# Patient Record
Sex: Female | Born: 1980
Health system: Southern US, Community
[De-identification: ages and names within clinical notes are randomized; demographics above are authoritative.]

## PROBLEM LIST (undated history)

## (undated) ENCOUNTER — Emergency Department (HOSPITAL_COMMUNITY): Admission: EM | Discharge status: Against Medical Advice | Payer: Self-pay

## (undated) DIAGNOSIS — D649 Anemia, unspecified: Secondary | ICD-10-CM

## (undated) DIAGNOSIS — R51 Headache: Secondary | ICD-10-CM

## (undated) DIAGNOSIS — T4145XA Adverse effect of unspecified anesthetic, initial encounter: Secondary | ICD-10-CM

## (undated) DIAGNOSIS — Z8489 Family history of other specified conditions: Secondary | ICD-10-CM

## (undated) DIAGNOSIS — K630 Abscess of intestine: Secondary | ICD-10-CM

## (undated) DIAGNOSIS — K5090 Crohn's disease, unspecified, without complications: Secondary | ICD-10-CM

## (undated) DIAGNOSIS — T8859XA Other complications of anesthesia, initial encounter: Secondary | ICD-10-CM

## (undated) DIAGNOSIS — K631 Perforation of intestine (nontraumatic): Secondary | ICD-10-CM

## (undated) HISTORY — DX: Crohn's disease, unspecified, without complications: K50.90

## (undated) HISTORY — DX: Abscess of intestine: K63.0

## (undated) HISTORY — DX: Perforation of intestine (nontraumatic): K63.1

---
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
## 2011-11-13 DIAGNOSIS — D649 Anemia, unspecified: Secondary | ICD-10-CM

## 2011-11-13 HISTORY — DX: Anemia, unspecified: D64.9

## 2012-06-28 ENCOUNTER — Encounter (HOSPITAL_COMMUNITY): Payer: Self-pay | Admitting: *Deleted

## 2012-06-28 ENCOUNTER — Emergency Department (HOSPITAL_COMMUNITY)
Admission: EM | Admit: 2012-06-28 | Discharge: 2012-06-28 | Disposition: A | Discharge status: Home / Self Care | Payer: Self-pay | Attending: Emergency Medicine | Admitting: Emergency Medicine

## 2012-06-28 ENCOUNTER — Emergency Department (HOSPITAL_COMMUNITY): Payer: Self-pay

## 2012-06-28 DIAGNOSIS — R1013 Epigastric pain: Secondary | ICD-10-CM | POA: Insufficient documentation

## 2012-06-28 DIAGNOSIS — R112 Nausea with vomiting, unspecified: Secondary | ICD-10-CM | POA: Insufficient documentation

## 2012-06-28 DIAGNOSIS — Z79899 Other long term (current) drug therapy: Secondary | ICD-10-CM | POA: Insufficient documentation

## 2012-06-28 DIAGNOSIS — R109 Unspecified abdominal pain: Secondary | ICD-10-CM

## 2012-06-28 DIAGNOSIS — K5289 Other specified noninfective gastroenteritis and colitis: Secondary | ICD-10-CM | POA: Insufficient documentation

## 2012-06-28 DIAGNOSIS — K529 Noninfective gastroenteritis and colitis, unspecified: Secondary | ICD-10-CM

## 2012-06-28 DIAGNOSIS — R1031 Right lower quadrant pain: Secondary | ICD-10-CM | POA: Insufficient documentation

## 2012-06-28 LAB — URINALYSIS, ROUTINE W REFLEX MICROSCOPIC
Bilirubin Urine: NEGATIVE
Leukocytes, UA: NEGATIVE
Nitrite: NEGATIVE
Specific Gravity, Urine: 1.025 (ref 1.005–1.030)
Urobilinogen, UA: 0.2 mg/dL (ref 0.0–1.0)

## 2012-06-28 LAB — HEPATIC FUNCTION PANEL
AST: 16 U/L (ref 0–37)
Albumin: 3.2 g/dL — ABNORMAL LOW (ref 3.5–5.2)
Alkaline Phosphatase: 60 U/L (ref 39–117)
Total Bilirubin: 0.1 mg/dL — ABNORMAL LOW (ref 0.3–1.2)

## 2012-06-28 LAB — DIFFERENTIAL
Eosinophils Relative: 2 % (ref 0–5)
Lymphocytes Relative: 15 % (ref 12–46)
Lymphs Abs: 1.9 10*3/uL (ref 0.7–4.0)
Monocytes Absolute: 1 10*3/uL (ref 0.1–1.0)
Monocytes Relative: 8 % (ref 3–12)

## 2012-06-28 LAB — COMPREHENSIVE METABOLIC PANEL
ALT: 9 U/L (ref 0–35)
AST: 12 U/L (ref 0–37)
CO2: 27 mEq/L (ref 19–32)
Calcium: 9.2 mg/dL (ref 8.4–10.5)
Sodium: 137 mEq/L (ref 135–145)
Total Protein: 8 g/dL (ref 6.0–8.3)

## 2012-06-28 LAB — CBC
MCH: 24.2 pg — ABNORMAL LOW (ref 26.0–34.0)
Platelets: 509 10*3/uL — ABNORMAL HIGH (ref 150–400)
RBC: 3.92 MIL/uL (ref 3.87–5.11)

## 2012-06-28 MED ORDER — IOHEXOL 300 MG/ML  SOLN
20.0000 mL | INTRAMUSCULAR | Status: AC
  Administered 2012-06-28: 20 mL via ORAL

## 2012-06-28 MED ORDER — IOHEXOL 300 MG/ML  SOLN
100.0000 mL | Freq: Once | INTRAMUSCULAR | Status: AC | PRN
  Administered 2012-06-28: 100 mL via INTRAVENOUS

## 2012-06-28 MED ORDER — PANTOPRAZOLE SODIUM 40 MG IV SOLR
40.0000 mg | Freq: Once | INTRAVENOUS | Status: AC
  Administered 2012-06-28: 40 mg via INTRAVENOUS
  Filled 2012-06-28: qty 40

## 2012-06-28 MED ORDER — HYDROCODONE-ACETAMINOPHEN 5-500 MG PO TABS: 1.0000 | ORAL_TABLET | Freq: Four times a day (QID) | ORAL | Status: AC | PRN

## 2012-06-28 MED ORDER — SODIUM CHLORIDE 0.9 % IV BOLUS (SEPSIS)
1000.0000 mL | Freq: Once | INTRAVENOUS | Status: AC
  Administered 2012-06-28: 1000 mL via INTRAVENOUS

## 2012-06-28 MED ORDER — GI COCKTAIL ~~LOC~~
30.0000 mL | Freq: Once | ORAL | Status: AC
  Administered 2012-06-28: 30 mL via ORAL
  Filled 2012-06-28: qty 30

## 2012-06-28 MED ORDER — PREDNISONE 20 MG PO TABS: 20.0000 mg | ORAL_TABLET | Freq: Every day | ORAL | Status: AC

## 2012-06-28 NOTE — Consult Note (Signed)
Reason for Consult:  Abdominal pain, abnormal CT  Referring Physician: Nelva Nay, MD  Gina Gonzalez is an 31 y.o. female.  HPI:  Patient is a 31 year old female who presents approximately 6 weeks of abdominal pain. She describes the pain as starting in the epigastric region. It waxes and wanes in intensity. It has been as bad as to make her cry. Today it seemed a little bit worse and she couldn't take it anymore. She was doubled over in pain. She threw up 5 times because of the pain. She denies fevers and chills. She denies bloody bowel movements. She denies any family history of Crohn's disease or other inflammatory bowel disease. She generally never gets sick. She is in school. 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  She has not had any significant diarrhea. She denies constipation as well.  History reviewed. No pertinent past medical history.  PSH:  Tonsillectomy   History reviewed. No pertinent family history.  Social History:  Pt does not use tobacco products, nor has she.  She reports that she does not drink alcohol or use illicit drugs.  Allergies: No Known Allergies  Medications:  MedicationsLong-Term  Prescriptions Show Facility-Administered Medications    bismuth subsalicylate (PEPTO BISMOL) 262 MG/15ML suspension   ibuprofen (ADVIL,MOTRIN) 200 MG tablet   OMEPRAZOLE PO   Simethicone (ANTI GAS PO)     Results for orders placed during the hospital encounter of 06/28/12 (from the past 48 hour(s))  URINALYSIS, ROUTINE W REFLEX MICROSCOPIC     Status: Abnormal   Collection Time   06/28/12  1:58 PM      Component Value Range Comment   Color, Urine YELLOW  YELLOW    APPearance HAZY (*) CLEAR    Specific Gravity, Urine 1.025  1.005 - 1.030    pH 5.5  5.0 - 8.0    Glucose, UA NEGATIVE  NEGATIVE mg/dL    Hgb urine dipstick NEGATIVE  NEGATIVE    Bilirubin Urine NEGATIVE  NEGATIVE    Ketones, ur NEGATIVE  NEGATIVE mg/dL    Protein, ur NEGATIVE  NEGATIVE mg/dL    Urobilinogen, UA 0.2  0.0 - 1.0 mg/dL    Nitrite NEGATIVE  NEGATIVE    Leukocytes, UA NEGATIVE  NEGATIVE MICROSCOPIC NOT DONE ON URINES WITH NEGATIVE PROTEIN, BLOOD, LEUKOCYTES, NITRITE, OR GLUCOSE <1000 mg/dL.  CBC     Status: Abnormal   Collection Time   06/28/12  1:58 PM      Component Value Range Comment   WBC 12.5 (*) 4.0 - 10.5 K/uL    RBC 3.92  3.87 - 5.11 MIL/uL    Hemoglobin 9.5 (*) 12.0 - 15.0 g/dL    HCT 16.1 (*) 09.6 - 46.0 %    MCV 77.6 (*) 78.0 - 100.0 fL    MCH 24.2 (*) 26.0 - 34.0 pg    MCHC 31.3  30.0 - 36.0 g/dL    RDW 04.5  40.9 - 81.1 %    Platelets 509 (*) 150 - 400 K/uL   COMPREHENSIVE METABOLIC PANEL     Status: Abnormal   Collection Time   06/28/12  1:58 PM      Component Value Range Comment   Sodium 137  135 - 145 mEq/L    Potassium 3.4 (*) 3.5 - 5.1 mEq/L    Chloride 99  96 - 112 mEq/L    CO2 27  19 - 32 mEq/L    Glucose, Bld 90  70 - 99 mg/dL  BUN 6  6 - 23 mg/dL    Creatinine, Ser 1.61  0.50 - 1.10 mg/dL    Calcium 9.2  8.4 - 09.6 mg/dL    Total Protein 8.0  6.0 - 8.3 g/dL    Albumin 3.2 (*) 3.5 - 5.2 g/dL    AST 12  0 - 37 U/L    ALT 9  0 - 35 U/L    Alkaline Phosphatase 59  39 - 117 U/L    Total Bilirubin 0.2 (*) 0.3 - 1.2 mg/dL    GFR calc non Af Amer >90  >90 mL/min    GFR calc Af Amer >90  >90 mL/min   DIFFERENTIAL     Status: Abnormal   Collection Time   06/28/12  1:58 PM      Component Value Range Comment   Neutrophils Relative 74  43 - 77 %    Neutro Abs 9.3 (*) 1.7 - 7.7 K/uL    Lymphocytes Relative 15  12 - 46 %    Lymphs Abs 1.9  0.7 - 4.0 K/uL    Monocytes Relative 8  3 - 12 %    Monocytes Absolute 1.0  0.1 - 1.0 K/uL    Eosinophils Relative 2  0 - 5 %    Eosinophils Absolute 0.3  0.0 - 0.7 K/uL    Basophils Relative 0  0 - 1 %    Basophils Absolute 0.0  0.0 - 0.1 K/uL   POCT PREGNANCY, URINE     Status: Normal   Collection Time   06/28/12  2:02 PM      Component Value Range Comment   Preg Test, Ur NEGATIVE  NEGATIVE     HEPATIC FUNCTION PANEL     Status: Abnormal   Collection Time   06/28/12  3:38 PM      Component Value Range Comment   Total Protein 8.0  6.0 - 8.3 g/dL    Albumin 3.2 (*) 3.5 - 5.2 g/dL    AST 16  0 - 37 U/L    ALT 8  0 - 35 U/L    Alkaline Phosphatase 60  39 - 117 U/L    Total Bilirubin 0.1 (*) 0.3 - 1.2 mg/dL    Bilirubin, Direct <0.4  0.0 - 0.3 mg/dL    Indirect Bilirubin NOT CALCULATED  0.3 - 0.9 mg/dL   LIPASE, BLOOD     Status: Normal   Collection Time   06/28/12  3:38 PM      Component Value Range Comment   Lipase 26  11 - 59 U/L   OCCULT BLOOD, POC DEVICE     Status: Normal   Collection Time   06/28/12  3:48 PM      Component Value Range Comment   Fecal Occult Bld NEGATIVE       Ct Abdomen Pelvis W Contrast  06/28/2012  *RADIOLOGY REPORT*  Clinical Data: Right lower quadrant pain, tenderness.  Symptoms since July.  Vomiting.  CT ABDOMEN AND PELVIS WITH CONTRAST  Technique:  Multidetector CT imaging of the abdomen and pelvis was performed following the standard protocol during bolus administration of intravenous contrast.  Contrast: OMNIPAQUE IOHEXOL 300 MG/ML  SOLN  Comparison: None.  Findings: There is marked inflammation within the right lower quadrant.  This involves the region of the terminal ileum which shows marked thickening of bowel loops, ascending colon, and the associated mesentery.  The appendix is not well seen.  The inflammatory process measured together is  9.7 x 7.4 x 8.9 cm. There are multiple enlarged mesenteric lymph nodes within this region, measuring up to 1.6 cm.  Additionally, there is fat within the colonic wall, raising question of chronic inflammation.  Images of the lung bases are unremarkable.  No focal abnormality identified within the liver, spleen, pancreas, adrenal glands, or kidneys.  The gallbladder is present.  The stomach and proximal small bowel loops are normal in caliber and opacified with contrast.  Distal colonic loops are normal in  appearance.  The uterus is present.  There is a small amount of free pelvic fluid.  No adnexal mass.  There is bilateral symmetric SI joint sclerosis, raising the question of sacroiliac this.  IMPRESSION:  1.  The significant inflammatory process in the right lower quadrant.  The differential diagnosis includes Crohn disease as well as appendicitis.  Given the secondary findings of fat within the colonic wall and sacroiliitis, Crohn disease is favored. 2.  Regional lymph nodes may be reactive.  The findings were discussed with Dr. Radford Pax  on date at time.  Original Report Authenticated By: Patterson Hammersmith, M.D.    Review of Systems  Constitutional: Negative.   HENT: Negative.   Eyes: Negative.   Respiratory: Negative.   Cardiovascular: Negative.   Gastrointestinal: Positive for heartburn, nausea, vomiting and abdominal pain. Negative for diarrhea, constipation, blood in stool and melena.  Genitourinary: Negative.   Musculoskeletal: Negative.   Skin: Negative.   Neurological: Negative.   Endo/Heme/Allergies: Negative.   Psychiatric/Behavioral: Negative.    Blood pressure 101/58, pulse 92, temperature 99.3 F (37.4 C), temperature source Oral, resp. rate 18, last menstrual period 06/12/2012, SpO2 99.00%. Physical Exam  Constitutional: She is oriented to person, place, and time. She appears well-developed and well-nourished. No distress.  HENT:  Head: Normocephalic and atraumatic.  Right Ear: External ear normal.  Left Ear: External ear normal.  Eyes: Conjunctivae are normal. Pupils are equal, round, and reactive to light. No scleral icterus.  Neck: Normal range of motion. Neck supple. No JVD present. No tracheal deviation present. No thyromegaly present.  Cardiovascular: Normal rate, regular rhythm, normal heart sounds and intact distal pulses.  Exam reveals no gallop and no friction rub.   No murmur heard. Respiratory: Effort normal and breath sounds normal. No stridor. No respiratory  distress. She has no wheezes. She has no rales. She exhibits no tenderness.  GI: Soft. Bowel sounds are normal. She exhibits mass (firmness in RLQ in site of inflammatory mass). She exhibits no distension. There is tenderness (mild tenderness in RLQ). There is no rebound and no guarding.  Musculoskeletal: Normal range of motion. She exhibits no edema and no tenderness.  Lymphadenopathy:    She has no cervical adenopathy.  Neurological: She is alert and oriented to person, place, and time. Coordination normal.  Skin: Skin is warm and dry. No rash noted. She is not diaphoretic. No erythema. No pallor.  Psychiatric: She has a normal mood and affect. Her behavior is normal. Judgment and thought content normal.    Assessment/Plan: Right sided colitis and terminal ileitis with inflammatory mass. History, CT scan, and examination not consistent with appendicitis.  Recommend gastroenterology consult. Suspect Crohn's disease. No need for acute surgical intervention. Likely needs antibiotics followed by anti-inflammatory agents. Certainly, the patient may develop obstruction and or abscess and potentially need surgical intervention in the future. However at this moment there is no role for ileocecectomy.  Will leave disposition to medicine and/or gastroenterology.  Followup with surgery on  an as-needed basis. Gina Gonzalez 06/28/2012, 7:19 PM

## 2012-06-28 NOTE — ED Notes (Signed)
Paged IV team for IV start.  Waiting for IV team

## 2012-06-28 NOTE — ED Notes (Signed)
Reports having upper mid abd pain since July, had vomiting this am. Denies any vaginal or urinary symptoms. No acute distress noted at triage.

## 2012-06-28 NOTE — ED Provider Notes (Signed)
I saw and evaluated the patient, reviewed the resident's note and I agree with the findings and plan.   .Face to face Exam:  General:  Awake HEENT:  Atraumatic Resp:  Normal effort Abd:  Nondistended Neuro:No focal weakness Lymph: No adenopathy   Nelia Shi, MD 06/28/12 2009

## 2012-06-28 NOTE — ED Notes (Signed)
Attempted PIV x 3 w/o success d/t poor vein access

## 2012-06-28 NOTE — ED Provider Notes (Signed)
History     CSN: [id number]  Arrival date & time 06/28/12  1340   First MD Initiated Contact with Patient 06/28/12 1507      Chief Complaint  Patient presents with  . Abdominal Pain    (Consider location/radiation/quality/duration/timing/severity/associated sxs/prior treatment) Patient is a 31 y.o. female presenting with abdominal pain. The history is provided by the patient.  Abdominal Pain The primary symptoms of the illness include abdominal pain, nausea and vomiting. The primary symptoms of the illness do not include fever, shortness of breath, diarrhea, hematemesis, hematochezia, dysuria, vaginal discharge or vaginal bleeding. Episode onset: 6 weeks ago. The onset of the illness was sudden. Progression since onset: waxing and waning.  The abdominal pain is located in the epigastric region and periumbilical region. The abdominal pain does not radiate. Pain scale: currently mild. The abdominal pain is relieved by nothing. The abdominal pain is exacerbated by eating (caffeine).  Nausea began today.  The vomiting began today. Vomiting occurs 2 to 5 times per day. The emesis contains stomach contents.  The patient states that she believes she is currently not pregnant. Symptoms associated with the illness do not include chills, constipation, urgency, hematuria, frequency or back pain.    History reviewed. No pertinent past medical history.  History reviewed. No pertinent past surgical history.  History reviewed. No pertinent family history.  History  Substance Use Topics  . Smoking status: Not on file  . Smokeless tobacco: Not on file  . Alcohol Use: No    OB History    Grav Para Term Preterm Abortions TAB SAB Ect Mult Living                  Review of Systems  Constitutional: Negative for fever and chills.  HENT: Negative.   Eyes: Negative.   Respiratory: Negative for shortness of breath.   Cardiovascular: Negative for chest pain and leg swelling.  Gastrointestinal:  Positive for nausea, vomiting and abdominal pain. Negative for diarrhea, constipation, hematochezia and hematemesis.       Dark stool 3 weeks ago  Genitourinary: Negative for dysuria, urgency, frequency, hematuria, decreased urine volume, vaginal bleeding, vaginal discharge and difficulty urinating.  Musculoskeletal: Negative for back pain.  Skin: Negative for wound.  Neurological: Negative for light-headedness and headaches.  Psychiatric/Behavioral: Negative for confusion.  All other systems reviewed and are negative.    Allergies  Review of patient's allergies indicates no known allergies.  Home Medications   Current Outpatient Rx  Name Route Sig Dispense Refill  . BISMUTH SUBSALICYLATE 262 MG/15ML PO SUSP Oral Take 30 mLs by mouth every 6 (six) hours as needed. For upset stomach    . IBUPROFEN 200 MG PO TABS Oral Take 600 mg by mouth every 6 (six) hours as needed. For pain    . OMEPRAZOLE PO Oral Take 1 capsule by mouth daily.    . ANTI GAS PO Oral Take 1 capsule by mouth daily.      BP 142/82  Pulse 105  Temp 99.5 F (37.5 C) (Oral)  Resp 16  SpO2 98%  LMP 06/12/2012  Physical Exam  Nursing note and vitals reviewed. Constitutional: She is oriented to person, place, and time. She appears well-developed and well-nourished. No distress.  HENT:  Head: Normocephalic and atraumatic.  Right Ear: External ear normal.  Left Ear: External ear normal.  Nose: Nose normal.  Mouth/Throat: Oropharynx is clear and moist.  Eyes: Right eye exhibits no discharge. Left eye exhibits no discharge.  Neck: Neck  supple.  Cardiovascular: Normal rate, regular rhythm, normal heart sounds and intact distal pulses.   Pulmonary/Chest: Effort normal and breath sounds normal. No respiratory distress. She has no wheezes. She has no rales.  Abdominal: Soft. She exhibits no distension. There is tenderness in the right lower quadrant and epigastric area. There is no CVA tenderness.  Genitourinary:  Rectal exam shows external hemorrhoid (small, no evidence of thrombosis). Guaiac negative stool.       No stool on rectal exam  Musculoskeletal: She exhibits no edema.  Neurological: She is alert and oriented to person, place, and time.  Skin: Skin is warm and dry. She is not diaphoretic. No pallor.    ED Course  Procedures (including critical care time)  Labs Reviewed  URINALYSIS, ROUTINE W REFLEX MICROSCOPIC - Abnormal; Notable for the following:    APPearance HAZY (*)     All other components within normal limits  CBC - Abnormal; Notable for the following:    WBC 12.5 (*)     Hemoglobin 9.5 (*)     HCT 30.4 (*)     MCV 77.6 (*)     MCH 24.2 (*)     Platelets 509 (*)     All other components within normal limits  COMPREHENSIVE METABOLIC PANEL - Abnormal; Notable for the following:    Potassium 3.4 (*)     Albumin 3.2 (*)     Total Bilirubin 0.2 (*)     All other components within normal limits  HEPATIC FUNCTION PANEL - Abnormal; Notable for the following:    Albumin 3.2 (*)     Total Bilirubin 0.1 (*)     All other components within normal limits  DIFFERENTIAL - Abnormal; Notable for the following:    Neutro Abs 9.3 (*)     All other components within normal limits  POCT PREGNANCY, URINE  LIPASE, BLOOD  OCCULT BLOOD, POC DEVICE   Ct Abdomen Pelvis W Contrast  06/28/2012  *RADIOLOGY REPORT*  Clinical Data: Right lower quadrant pain, tenderness.  Symptoms since July.  Vomiting.  CT ABDOMEN AND PELVIS WITH CONTRAST  Technique:  Multidetector CT imaging of the abdomen and pelvis was performed following the standard protocol during bolus administration of intravenous contrast.  Contrast: OMNIPAQUE IOHEXOL 300 MG/ML  SOLN  Comparison: None.  Findings: There is marked inflammation within the right lower quadrant.  This involves the region of the terminal ileum which shows marked thickening of bowel loops, ascending colon, and the associated mesentery.  The appendix is not well  seen.  The inflammatory process measured together is 9.7 x 7.4 x 8.9 cm. There are multiple enlarged mesenteric lymph nodes within this region, measuring up to 1.6 cm.  Additionally, there is fat within the colonic wall, raising question of chronic inflammation.  Images of the lung bases are unremarkable.  No focal abnormality identified within the liver, spleen, pancreas, adrenal glands, or kidneys.  The gallbladder is present.  The stomach and proximal small bowel loops are normal in caliber and opacified with contrast.  Distal colonic loops are normal in appearance.  The uterus is present.  There is a small amount of free pelvic fluid.  No adnexal mass.  There is bilateral symmetric SI joint sclerosis, raising the question of sacroiliac this.  IMPRESSION:  1.  The significant inflammatory process in the right lower quadrant.  The differential diagnosis includes Crohn disease as well as appendicitis.  Given the secondary findings of fat within the colonic wall and  sacroiliitis, Crohn disease is favored. 2.  Regional lymph nodes may be reactive.  The findings were discussed with Dr. Radford Pax  on date at time.  Original Report Authenticated By: Patterson Hammersmith, M.D.     1. Inflammatory bowel diseases (IBD)       MDM  31 yo female with intermittent epigastric pain x 6 weeks, occasionally worse with food and caffeine. Also vague periumbilical pain and had worst episode of pain with vomiting this AM. Appears well, but most tender near McBurney's point. Symptoms most c/w gastritis/PUD, but with RLQ tenderness and vague periumbilical pain (along with white count) CT ordered to r/o appendicitis. No vaginal or urinary symptoms. CT shows large inflammatory changes, unable to see appendix. Surgery consulted, they feel this is all IBD, likely chron's. Patient's pain well controlled, is comfortable. Discussed over the phone with GI (Dr. Evette Cristal), who says despite no insurance will be able to follow up in clinic early  next week. Will put on prednisone per their suggestion (20 mg daily x 7 days) with vicodin for breakthrough pain. Otherwise no fevers and has mild anemia. Discussed importance of following up with GI, and concerning symptoms to look out for to return.        Pricilla Loveless, MD 06/28/12 2004

## 2012-07-29 ENCOUNTER — Encounter (HOSPITAL_COMMUNITY): Payer: Self-pay | Admitting: Emergency Medicine

## 2012-07-29 ENCOUNTER — Emergency Department (HOSPITAL_COMMUNITY): Payer: Self-pay

## 2012-07-29 ENCOUNTER — Inpatient Hospital Stay (HOSPITAL_COMMUNITY)
Admission: EM | Admit: 2012-07-29 | Discharge: 2012-08-05 | DRG: 394 | Disposition: A | Discharge status: Home / Self Care | Payer: MEDICAID | Attending: Family Medicine | Admitting: Family Medicine

## 2012-07-29 DIAGNOSIS — R1031 Right lower quadrant pain: Secondary | ICD-10-CM

## 2012-07-29 DIAGNOSIS — K5090 Crohn's disease, unspecified, without complications: Secondary | ICD-10-CM

## 2012-07-29 DIAGNOSIS — K631 Perforation of intestine (nontraumatic): Principal | ICD-10-CM

## 2012-07-29 DIAGNOSIS — R109 Unspecified abdominal pain: Secondary | ICD-10-CM

## 2012-07-29 DIAGNOSIS — R509 Fever, unspecified: Secondary | ICD-10-CM

## 2012-07-29 DIAGNOSIS — D509 Iron deficiency anemia, unspecified: Secondary | ICD-10-CM | POA: Diagnosis present

## 2012-07-29 DIAGNOSIS — E876 Hypokalemia: Secondary | ICD-10-CM | POA: Diagnosis present

## 2012-07-29 DIAGNOSIS — G479 Sleep disorder, unspecified: Secondary | ICD-10-CM

## 2012-07-29 DIAGNOSIS — K5000 Crohn's disease of small intestine without complications: Secondary | ICD-10-CM | POA: Diagnosis present

## 2012-07-29 DIAGNOSIS — K529 Noninfective gastroenteritis and colitis, unspecified: Secondary | ICD-10-CM

## 2012-07-29 DIAGNOSIS — Z79899 Other long term (current) drug therapy: Secondary | ICD-10-CM

## 2012-07-29 HISTORY — DX: Perforation of intestine (nontraumatic): K63.1

## 2012-07-29 LAB — URINALYSIS, ROUTINE W REFLEX MICROSCOPIC
Hgb urine dipstick: NEGATIVE
Ketones, ur: NEGATIVE mg/dL
Protein, ur: NEGATIVE mg/dL
Urobilinogen, UA: 0.2 mg/dL (ref 0.0–1.0)

## 2012-07-29 LAB — CBC WITH DIFFERENTIAL/PLATELET
Basophils Absolute: 0 10*3/uL (ref 0.0–0.1)
Basophils Relative: 0 % (ref 0–1)
Eosinophils Relative: 0 % (ref 0–5)
HCT: 31.2 % — ABNORMAL LOW (ref 36.0–46.0)
Lymphocytes Relative: 9 % — ABNORMAL LOW (ref 12–46)
MCHC: 30.4 g/dL (ref 30.0–36.0)
MCV: 76.1 fL — ABNORMAL LOW (ref 78.0–100.0)
Monocytes Absolute: 1.5 10*3/uL — ABNORMAL HIGH (ref 0.1–1.0)
RDW: 15.9 % — ABNORMAL HIGH (ref 11.5–15.5)

## 2012-07-29 LAB — BASIC METABOLIC PANEL
CO2: 26 mEq/L (ref 19–32)
Calcium: 9.3 mg/dL (ref 8.4–10.5)
Creatinine, Ser: 0.76 mg/dL (ref 0.50–1.10)

## 2012-07-29 MED ORDER — SODIUM CHLORIDE 0.9 % IV BOLUS (SEPSIS)
1000.0000 mL | Freq: Once | INTRAVENOUS | Status: AC
  Administered 2012-07-29: 1000 mL via INTRAVENOUS

## 2012-07-29 MED ORDER — METRONIDAZOLE IN NACL 5-0.79 MG/ML-% IV SOLN
500.0000 mg | Freq: Once | INTRAVENOUS | Status: AC
  Administered 2012-07-29: 500 mg via INTRAVENOUS
  Filled 2012-07-29: qty 100

## 2012-07-29 MED ORDER — IOHEXOL 300 MG/ML  SOLN
100.0000 mL | Freq: Once | INTRAMUSCULAR | Status: AC | PRN
  Administered 2012-07-29: 100 mL via INTRAVENOUS

## 2012-07-29 MED ORDER — ONDANSETRON HCL 4 MG/2ML IJ SOLN
4.0000 mg | Freq: Once | INTRAMUSCULAR | Status: DC
  Filled 2012-07-29 (×2): qty 2

## 2012-07-29 MED ORDER — HYDROMORPHONE HCL PF 1 MG/ML IJ SOLN
1.0000 mg | Freq: Once | INTRAMUSCULAR | Status: DC
  Filled 2012-07-29 (×2): qty 1

## 2012-07-29 MED ORDER — CIPROFLOXACIN IN D5W 400 MG/200ML IV SOLN
400.0000 mg | Freq: Once | INTRAVENOUS | Status: AC
  Administered 2012-07-29: 400 mg via INTRAVENOUS
  Filled 2012-07-29: qty 200

## 2012-07-29 MED ORDER — ACETAMINOPHEN 325 MG PO TABS
650.0000 mg | ORAL_TABLET | Freq: Once | ORAL | Status: AC
  Administered 2012-07-29: 650 mg via ORAL
  Filled 2012-07-29: qty 2

## 2012-07-29 MED ORDER — IOHEXOL 300 MG/ML  SOLN
20.0000 mL | INTRAMUSCULAR | Status: AC
  Administered 2012-07-29 (×2): 20 mL via ORAL

## 2012-07-29 NOTE — ED Provider Notes (Signed)
Spoke with surgery regarding patient and discussed CT findings.  She will see patient in consult, requests medicine to admit for bowel rest and IV antibiotics.  8:41 PM Spoke with family medicine to admit.  Will initiate cipro and flagyl.  Jimmye Norman, NP 07/29/12 [phone]

## 2012-07-29 NOTE — Consult Note (Signed)
Reason for Consult:  terminal ileitis with microperforation, probable crohn's dx Referring Physician: Susy Frizzle, MD  Gina Gonzalez is an 31 y.o. female.  HPI:  Pt is a 31 yo F who was seen last month in the ED for similar but less severe abdominal pain.  CT was concerning for crohn's disease.  She saw Dr. Madilyn Fireman, who placed her on steroids.  She states that overall she has felt much better until last night.  She developed some discomfort, followed by fever/ chills.  She did not really have anorexia.  She has still been taking the steroids.    History reviewed. Probable Crohns, no bx yet  .History reviewed. No pertinent past surgical history.  History reviewed. No pertinent family history.  Social History:  does not have a smoking history on file. She does not have any smokeless tobacco history on file. She reports that she does not drink alcohol or use illicit drugs.  Allergies: No Known Allergies  Medications: Prescriptions Show Facility-Administered Medications    ibuprofen (ADVIL,MOTRIN) 200 MG tablet   predniSONE (DELTASONE) 20 MG tablet   promethazine (PHENERGAN) 25 MG tablet   Simethicone (ANTI GAS PO)      Results for orders placed during the hospital encounter of 07/29/12 (from the past 48 hour(s))  CBC WITH DIFFERENTIAL     Status: Abnormal   Collection Time   07/29/12 11:46 AM      Component Value Range Comment   WBC 18.3 (*) 4.0 - 10.5 K/uL    RBC 4.10  3.87 - 5.11 MIL/uL    Hemoglobin 9.5 (*) 12.0 - 15.0 g/dL    HCT 47.8 (*) 29.5 - 46.0 %    MCV 76.1 (*) 78.0 - 100.0 fL    MCH 23.2 (*) 26.0 - 34.0 pg    MCHC 30.4  30.0 - 36.0 g/dL    RDW 62.1 (*) 30.8 - 15.5 %    Platelets 474 (*) 150 - 400 K/uL    Neutrophils Relative 82 (*) 43 - 77 %    Neutro Abs 15.0 (*) 1.7 - 7.7 K/uL    Lymphocytes Relative 9 (*) 12 - 46 %    Lymphs Abs 1.7  0.7 - 4.0 K/uL    Monocytes Relative 8  3 - 12 %    Monocytes Absolute 1.5 (*) 0.1 - 1.0 K/uL    Eosinophils Relative 0  0  - 5 %    Eosinophils Absolute 0.1  0.0 - 0.7 K/uL    Basophils Relative 0  0 - 1 %    Basophils Absolute 0.0  0.0 - 0.1 K/uL   BASIC METABOLIC PANEL     Status: Abnormal   Collection Time   07/29/12 11:46 AM      Component Value Range Comment   Sodium 132 (*) 135 - 145 mEq/L    Potassium 3.3 (*) 3.5 - 5.1 mEq/L    Chloride 95 (*) 96 - 112 mEq/L    CO2 26  19 - 32 mEq/L    Glucose, Bld 112 (*) 70 - 99 mg/dL    BUN 14  6 - 23 mg/dL    Creatinine, Ser 6.57  0.50 - 1.10 mg/dL    Calcium 9.3  8.4 - 84.6 mg/dL    GFR calc non Af Amer >90  >90 mL/min    GFR calc Af Amer >90  >90 mL/min   POCT PREGNANCY, URINE     Status: Normal   Collection Time   07/29/12 12:07  PM      Component Value Range Comment   Preg Test, Ur NEGATIVE  NEGATIVE   URINALYSIS, ROUTINE W REFLEX MICROSCOPIC     Status: Abnormal   Collection Time   07/29/12 12:13 PM      Component Value Range Comment   Color, Urine YELLOW  YELLOW    APPearance CLOUDY (*) CLEAR    Specific Gravity, Urine 1.029  1.005 - 1.030    pH 5.5  5.0 - 8.0    Glucose, UA NEGATIVE  NEGATIVE mg/dL    Hgb urine dipstick NEGATIVE  NEGATIVE    Bilirubin Urine SMALL (*) NEGATIVE    Ketones, ur NEGATIVE  NEGATIVE mg/dL    Protein, ur NEGATIVE  NEGATIVE mg/dL    Urobilinogen, UA 0.2  0.0 - 1.0 mg/dL    Nitrite NEGATIVE  NEGATIVE    Leukocytes, UA NEGATIVE  NEGATIVE MICROSCOPIC NOT DONE ON URINES WITH NEGATIVE PROTEIN, BLOOD, LEUKOCYTES, NITRITE, OR GLUCOSE <1000 mg/dL.    Ct Abdomen Pelvis W Contrast  07/29/2012  *RADIOLOGY REPORT*  Clinical Data: Abdominal pain.  Fever.  Leukocytosis.  Right lower quadrant inflammatory process.  CT ABDOMEN AND PELVIS WITH CONTRAST  Technique:  Multidetector CT imaging of the abdomen and pelvis was performed following the standard protocol during bolus administration of intravenous contrast.  Contrast: OMNIPAQUE IOHEXOL 300 MG/ML  SOLN  Comparison: 06/28/2012  Findings: Fluid density lesion along the anterior  splenic capsule measuring 1.7 cm on image 12 of series 2 appears stable. Otherwise, the liver, spleen, pancreas, and adrenal glands appear unremarkable.  The gallbladder and biliary system appear unremarkable.  The kidneys appear unremarkable, as do the proximal ureters.  Prominent infiltrative process in the right lower quadrant adjacent to the cecum is observed with prominent wall thickening of the distal ileum, extraluminal locules of gas in the inflammatory mesenteric process, and adjacent enlarged lymph nodes in the region. There is thickening of the inferior wall of the cecum. Orally measured contrast medium does appear to extend through the terminal ileum to the cecum, with a "string sign" in the terminal ileum and possible ulceration along the terminal ileum as well. The appendix is obscured completely.  Free pelvic fluid in the cul-de-sac is likely mildly complex. There is also free fluid adjacent to a 3.9 x 3.2 cm cystic lesion of the left ovary.  No other bowel lesions are observed.  There is sclerosis in the sacroiliac joints favoring sacroiliitis which appears bilaterally symmetric.  IMPRESSION:  1.  Prominent infiltrative process in the right lower quadrant is again noted, now with locules of extraluminal gas internally indicating microperforation.  Marked wall thickening of the terminal ileum noted.  The appendix is completely obscured.  Given the overall appearance, I suspect that this represents Crohn's disease with transmural terminal ileal inflammation, marked surrounding inflammatory findings, and localized microperforation. The appendix remains completely obscured, and accordingly a smoldering appendicitis is not readily excluded although the terminal ileum appears to be the epicenter of the process.  There is also a small amount of potentially complex free pelvic fluid in the cul-de-sac.  Orally administered contrast does extend through to the colon. 2.  3.9 cm left ovarian cyst. 3.  The  presence of bilateral symmetric sacroiliitis is commonly associated with inflammatory bowel disease/Crohn's disease. 4.  Stable likely incidental hypodense lesion in the anterior spleen.   Original Report Authenticated By: Dellia Cloud, M.D.     Review of Systems  Constitutional: Positive for fever and chills.  Negative for weight loss, malaise/fatigue and diaphoresis.  HENT: Negative.   Eyes: Negative.   Respiratory: Negative.   Cardiovascular: Negative.   Gastrointestinal: Positive for abdominal pain. Negative for heartburn, nausea, vomiting, diarrhea, constipation and blood in stool.  Genitourinary: Negative.   Musculoskeletal: Positive for back pain.  Skin: Negative.   Neurological: Negative.  Negative for weakness.  Endo/Heme/Allergies: Negative.   Psychiatric/Behavioral: Negative.    Blood pressure 118/72, pulse 76, temperature 97.9 F (36.6 C), temperature source Oral, resp. rate 16, last menstrual period 07/13/2012, SpO2 98.00%. Physical Exam  Constitutional: She is oriented to person, place, and time. She appears well-developed and well-nourished. No distress.  HENT:  Head: Normocephalic and atraumatic.  Mouth/Throat: Abnormal dentition.       Partial plate  Eyes: Conjunctivae normal are normal. Pupils are equal, round, and reactive to light. No scleral icterus.  Neck: Normal range of motion. Neck supple. No thyromegaly present.  Cardiovascular: Normal rate, regular rhythm, normal heart sounds and intact distal pulses.  Exam reveals no gallop and no friction rub.   No murmur heard. Respiratory: Breath sounds normal. No respiratory distress. She has no wheezes. She exhibits no tenderness.  GI: Soft. Bowel sounds are normal. She exhibits distension (mildly bloated) and mass (fullness in area of phlegmon). There is tenderness (RLQ).  Musculoskeletal: Normal range of motion.  Lymphadenopathy:    She has no cervical adenopathy.  Neurological: She is alert and oriented to  person, place, and time. Coordination normal.  Skin: Skin is warm and dry. No rash noted. She is not diaphoretic. No erythema. No pallor.  Psychiatric: She has a normal mood and affect. Her behavior is normal. Judgment and thought content normal.    Assessment/Plan: Crohn's terminal ileitis NPO IVF IV antibiotics.  Will likely need ileocectomy, will see what next 24-48 hours bring [facility] 07/29/2012, 10:58 PM

## 2012-07-29 NOTE — ED Provider Notes (Signed)
Medical screening examination/treatment/procedure(s) were conducted as a shared visit with non-physician practitioner(s) and myself.  I personally evaluated the patient during the encounter   Charles B. Bernette Mayers, MD 07/29/12 2317

## 2012-07-29 NOTE — ED Notes (Signed)
Pt sent here for eval from GI and they sts she has intermittent lower abd pain; pt denies pain at present; GI requests CT scan and blood work for Crohns work up

## 2012-07-29 NOTE — ED Notes (Signed)
Pt comes in reporting sent here for CT scan of abdomen. Pt possibly having Crohn's disease. Denying any n/v at this time. C/o RLQ pain, tender to palpation. No diarrhea. A x 4. Pt appearing pale.

## 2012-07-29 NOTE — ED Provider Notes (Signed)
History     CSN: [id number]  Arrival date & time 07/29/12  1123   First MD Initiated Contact with Patient 07/29/12 1515      Chief Complaint  Patient presents with  . Abdominal Pain    (Consider location/radiation/quality/duration/timing/severity/associated sxs/prior treatment) HPI Pt with intermittent abdominal pain for the last month or so. Seen in the ED for same 4 weeks ago and found to have inflammatory process in RLQ on CT, not felt to be appendicitis by Gen Surg. Discharged to GI followup, has seen Dr. Madilyn Fireman who states she has been reluctant to do much extensive outpatient evaluation due to lack of insurance. He gave her a course of steroids which has not helped much. Pt reports continued pain, nausea, no diarrhea. No blood in stool. She began running a fever last night, found to have temp 100.20F and HR 134 in Dr. Madilyn Fireman office today so he advised her to come to the ED for re-evaluation.   History reviewed. No pertinent past medical history.  History reviewed. No pertinent past surgical history.  History reviewed. No pertinent family history.  History  Substance Use Topics  . Smoking status: Not on file  . Smokeless tobacco: Not on file  . Alcohol Use: No    OB History    Grav Para Term Preterm Abortions TAB SAB Ect Mult Living                  Review of Systems All other systems reviewed and are negative except as noted in HPI.   Allergies  Review of patient's allergies indicates no known allergies.  Home Medications   Current Outpatient Rx  Name Route Sig Dispense Refill  . IBUPROFEN 200 MG PO TABS Oral Take 600 mg by mouth every 6 (six) hours as needed. For pain    . PREDNISONE 20 MG PO TABS Oral Take 20 mg by mouth daily.    Marland Kitchen PROMETHAZINE HCL 25 MG PO TABS Oral Take 25 mg by mouth every 6 (six) hours as needed. For nausea    . ANTI GAS PO Oral Take 1 capsule by mouth daily.      BP 125/57  Pulse 114  Temp 99.2 F (37.3 C) (Oral)  Resp 18  SpO2  98%  Physical Exam  Nursing note and vitals reviewed. Constitutional: She is oriented to person, place, and time. She appears well-developed and well-nourished.  HENT:  Head: Normocephalic and atraumatic.  Eyes: EOM are normal. Pupils are equal, round, and reactive to light.  Neck: Normal range of motion. Neck supple.  Cardiovascular: Normal rate, normal heart sounds and intact distal pulses.   Pulmonary/Chest: Effort normal and breath sounds normal.  Abdominal: Soft. She exhibits no distension and no mass. There is tenderness (RLQ tender). There is no rebound and no guarding.  Musculoskeletal: Normal range of motion. She exhibits no edema and no tenderness.  Neurological: She is alert and oriented to person, place, and time. She has normal strength. No cranial nerve deficit or sensory deficit.  Skin: Skin is warm and dry. No rash noted.  Psychiatric: She has a normal mood and affect.    ED Course  Procedures (including critical care time)  Labs Reviewed  CBC WITH DIFFERENTIAL - Abnormal; Notable for the following:    WBC 18.3 (*)     Hemoglobin 9.5 (*)     HCT 31.2 (*)     MCV 76.1 (*)     MCH 23.2 (*)     RDW  15.9 (*)     Platelets 474 (*)     Neutrophils Relative 82 (*)     Neutro Abs 15.0 (*)     Lymphocytes Relative 9 (*)     Monocytes Absolute 1.5 (*)     All other components within normal limits  BASIC METABOLIC PANEL - Abnormal; Notable for the following:    Sodium 132 (*)     Potassium 3.3 (*)     Chloride 95 (*)     Glucose, Bld 112 (*)     All other components within normal limits  URINALYSIS, ROUTINE W REFLEX MICROSCOPIC - Abnormal; Notable for the following:    APPearance CLOUDY (*)     Bilirubin Urine SMALL (*)     All other components within normal limits  POCT PREGNANCY, URINE  URINALYSIS, ROUTINE W REFLEX MICROSCOPIC   No results found.   No diagnosis found.    MDM  Increased leukocytosis, low grade fever and tachycardia. Discussed with Dr.  Madilyn Fireman as above. Will recheck CT abd/pel. Dispo per those results.    4:57 PM Pt moved to CDU pending CT scan. Discussed with Felicie Morn, PA. Medical screening examination/treatment/procedure(s) were conducted as a shared visit with non-physician practitioner(s) and myself.  I personally evaluated the patient during the encounter      Charles B. Bernette Mayers, MD 07/29/12 [phone]

## 2012-07-30 ENCOUNTER — Encounter (HOSPITAL_COMMUNITY): Payer: Self-pay | Admitting: *Deleted

## 2012-07-30 DIAGNOSIS — R509 Fever, unspecified: Secondary | ICD-10-CM

## 2012-07-30 DIAGNOSIS — R1032 Left lower quadrant pain: Secondary | ICD-10-CM

## 2012-07-30 LAB — CBC
MCH: 23.3 pg — ABNORMAL LOW (ref 26.0–34.0)
MCHC: 30.6 g/dL (ref 30.0–36.0)
MCV: 76.1 fL — ABNORMAL LOW (ref 78.0–100.0)
Platelets: 357 10*3/uL (ref 150–400)

## 2012-07-30 LAB — BASIC METABOLIC PANEL
BUN: 7 mg/dL (ref 6–23)
CO2: 30 mEq/L (ref 19–32)
Calcium: 9 mg/dL (ref 8.4–10.5)
GFR calc non Af Amer: >90 mL/min (ref >90)
Glucose, Bld: 82 mg/dL (ref 70–99)

## 2012-07-30 LAB — IRON AND TIBC
Iron: 14 ug/dL — ABNORMAL LOW (ref 42–135)
TIBC: 197 ug/dL — ABNORMAL LOW (ref 250–470)
UIBC: 183 ug/dL (ref 125–400)

## 2012-07-30 LAB — FERRITIN: Ferritin: 51 ng/mL (ref 10–291)

## 2012-07-30 LAB — FOLATE: Folate: 14.1 ng/mL

## 2012-07-30 MED ORDER — POTASSIUM CHLORIDE 10 MEQ/100ML IV SOLN
10.0000 meq | INTRAVENOUS | Status: AC
  Administered 2012-07-30 (×2): 10 meq via INTRAVENOUS
  Filled 2012-07-30 (×2): qty 100

## 2012-07-30 MED ORDER — METHYLPREDNISOLONE SODIUM SUCC 40 MG IJ SOLR
30.0000 mg | Freq: Two times a day (BID) | INTRAMUSCULAR | Status: DC
  Administered 2012-07-30 – 2012-07-31 (×3): 30 mg via INTRAVENOUS
  Filled 2012-07-30 (×5): qty 0.75

## 2012-07-30 MED ORDER — ACETAMINOPHEN 650 MG RE SUPP
650.0000 mg | Freq: Four times a day (QID) | RECTAL | Status: DC | PRN
  Administered 2012-07-30: 650 mg via RECTAL
  Filled 2012-07-30 (×2): qty 1

## 2012-07-30 MED ORDER — MORPHINE SULFATE 2 MG/ML IJ SOLN
1.0000 mg | INTRAMUSCULAR | Status: DC | PRN
  Administered 2012-08-04 (×2): 1 mg via INTRAVENOUS
  Filled 2012-07-30 (×2): qty 1

## 2012-07-30 MED ORDER — ACETAMINOPHEN 325 MG PO TABS: 650.0000 mg | ORAL_TABLET | Freq: Four times a day (QID) | ORAL | Status: DC | PRN

## 2012-07-30 MED ORDER — POTASSIUM CHLORIDE 10 MEQ/100ML IV SOLN
INTRAVENOUS | Status: AC
  Administered 2012-07-30: 10 meq
  Filled 2012-07-30: qty 100

## 2012-07-30 MED ORDER — PIPERACILLIN-TAZOBACTAM 3.375 G IVPB
3.3750 g | Freq: Three times a day (TID) | INTRAVENOUS | Status: DC
  Administered 2012-07-30 – 2012-08-05 (×19): 3.375 g via INTRAVENOUS
  Filled 2012-07-30 (×22): qty 50

## 2012-07-30 MED ORDER — METHYLPREDNISOLONE SODIUM SUCC 40 MG IJ SOLR: 20.0000 mg | Freq: Every day | INTRAMUSCULAR | Status: DC

## 2012-07-30 MED ORDER — CIPROFLOXACIN IN D5W 400 MG/200ML IV SOLN
400.0000 mg | Freq: Two times a day (BID) | INTRAVENOUS | Status: DC
  Filled 2012-07-30: qty 200

## 2012-07-30 MED ORDER — HEPARIN SODIUM (PORCINE) 5000 UNIT/ML IJ SOLN
5000.0000 [IU] | Freq: Three times a day (TID) | INTRAMUSCULAR | Status: DC
  Filled 2012-07-30 (×23): qty 1

## 2012-07-30 MED ORDER — PANTOPRAZOLE SODIUM 40 MG IV SOLR
40.0000 mg | INTRAVENOUS | Status: DC
  Administered 2012-07-30 – 2012-08-04 (×7): 40 mg via INTRAVENOUS
  Filled 2012-07-30 (×8): qty 40

## 2012-07-30 MED ORDER — ONDANSETRON HCL 4 MG/2ML IJ SOLN
4.0000 mg | Freq: Four times a day (QID) | INTRAMUSCULAR | Status: DC | PRN
  Administered 2012-08-03 – 2012-08-04 (×2): 4 mg via INTRAVENOUS
  Filled 2012-07-30 (×2): qty 2

## 2012-07-30 MED ORDER — METRONIDAZOLE IN NACL 5-0.79 MG/ML-% IV SOLN
500.0000 mg | Freq: Three times a day (TID) | INTRAVENOUS | Status: DC
  Administered 2012-07-30: 500 mg via INTRAVENOUS
  Filled 2012-07-30 (×2): qty 100

## 2012-07-30 MED ORDER — DEXTROSE-NACL 5-0.45 % IV SOLN
INTRAVENOUS | Status: DC
  Administered 2012-07-30 (×2): via INTRAVENOUS
  Administered 2012-07-31 (×2): 1000 mL via INTRAVENOUS
  Administered 2012-07-31 – 2012-08-04 (×7): via INTRAVENOUS
  Administered 2012-08-04: 75 mL/h via INTRAVENOUS

## 2012-07-30 MED ORDER — SODIUM CHLORIDE 0.9 % IV SOLN
INTRAVENOUS | Status: DC
  Administered 2012-07-30: 08:00:00 via INTRAVENOUS
  Administered 2012-07-30: 1000 mL via INTRAVENOUS

## 2012-07-30 MED ORDER — ONDANSETRON HCL 8 MG PO TABS: 4.0000 mg | ORAL_TABLET | Freq: Four times a day (QID) | ORAL | Status: DC | PRN

## 2012-07-30 NOTE — Progress Notes (Signed)
Subjective: Resting quietly in bed, states that her pain is well controlled, denies any N/V. Requests diet education.  Objective: Vital signs in last 24 hours: Temp:  [97.9 F (36.6 C)-99.2 F (37.3 C)] 98 F (36.7 C) (09/18 0536) Pulse Rate:  [76-114] 86  (09/18 0536) Resp:  [16-20] 20  (09/18 0536) BP: (106-139)/(57-79) 139/79 mmHg (09/18 0536) SpO2:  [95 %-100 %] 95 % (09/18 0536) Weight:  [254 lb (115.214 kg)] 254 lb (115.214 kg) (09/18 0050) Last BM Date: 07/28/12  Intake/Output from previous day: 09/17 0701 - 09/18 0700 In: 967.5 [I.V.:857.5; IV Piggyback:110] Out: 1 [Urine:1] Intake/Output this shift:    General appearance: alert, cooperative, appears stated age, no distress and moderately obese Chest: CTA bilaterally Cardiac: RRR, No M/R/G Abdomen: obese, soft, diffusely tender more in rlq, minimal BS, +BM "watery" this am x 1.  VSS, afebrile, WBC wnl, H&H down slightly (probably secondary to IVF)  Lab Results:   Basename 07/30/12 0520 07/29/12 1146  WBC 10.1 18.3*  HGB 8.3* 9.5*  HCT 27.1* 31.2*  PLT 357 474*   BMET  Basename 07/30/12 0520 07/29/12 1146  NA 139 132*  K 3.3* 3.3*  CL 103 95*  CO2 30 26  GLUCOSE 82 112*  BUN 7 14  CREATININE 0.65 0.76  CALCIUM 9.0 9.3   PT/INR No results found for this basename: LABPROT:2,INR:2 in the last 72 hours ABG No results found for this basename: PHART:2,PCO2:2,PO2:2,HCO3:2 in the last 72 hours  Studies/Results: Ct Abdomen Pelvis W Contrast  07/29/2012  *RADIOLOGY REPORT*  Clinical Data: Abdominal pain.  Fever.  Leukocytosis.  Right lower quadrant inflammatory process.  CT ABDOMEN AND PELVIS WITH CONTRAST  Technique:  Multidetector CT imaging of the abdomen and pelvis was performed following the standard protocol during bolus administration of intravenous contrast.  Contrast: OMNIPAQUE IOHEXOL 300 MG/ML  SOLN  Comparison: 06/28/2012  Findings: Fluid density lesion along the anterior splenic capsule  measuring 1.7 cm on image 12 of series 2 appears stable. Otherwise, the liver, spleen, pancreas, and adrenal glands appear unremarkable.  The gallbladder and biliary system appear unremarkable.  The kidneys appear unremarkable, as do the proximal ureters.  Prominent infiltrative process in the right lower quadrant adjacent to the cecum is observed with prominent wall thickening of the distal ileum, extraluminal locules of gas in the inflammatory mesenteric process, and adjacent enlarged lymph nodes in the region. There is thickening of the inferior wall of the cecum. Orally measured contrast medium does appear to extend through the terminal ileum to the cecum, with a "string sign" in the terminal ileum and possible ulceration along the terminal ileum as well. The appendix is obscured completely.  Free pelvic fluid in the cul-de-sac is likely mildly complex. There is also free fluid adjacent to a 3.9 x 3.2 cm cystic lesion of the left ovary.  No other bowel lesions are observed.  There is sclerosis in the sacroiliac joints favoring sacroiliitis which appears bilaterally symmetric.  IMPRESSION:  1.  Prominent infiltrative process in the right lower quadrant is again noted, now with locules of extraluminal gas internally indicating microperforation.  Marked wall thickening of the terminal ileum noted.  The appendix is completely obscured.  Given the overall appearance, I suspect that this represents Crohn's disease with transmural terminal ileal inflammation, marked surrounding inflammatory findings, and localized microperforation. The appendix remains completely obscured, and accordingly a smoldering appendicitis is not readily excluded although the terminal ileum appears to be the epicenter of the process.  There is also a small amount of potentially complex free pelvic fluid in the cul-de-sac.  Orally administered contrast does extend through to the colon. 2.  3.9 cm left ovarian cyst. 3.  The presence of bilateral  symmetric sacroiliitis is commonly associated with inflammatory bowel disease/Crohn's disease. 4.  Stable likely incidental hypodense lesion in the anterior spleen.   Original Report Authenticated By: Dellia Cloud, M.D.     Anti-infectives: Anti-infectives     Start     Dose/Rate Route Frequency Ordered Stop   07/30/12 0900   ciprofloxacin (CIPRO) IVPB 400 mg  Status:  Discontinued        400 mg 200 mL/hr over 60 Minutes Intravenous Every 12 hours 07/30/12 0011 07/30/12 0726   07/30/12 0730  piperacillin-tazobactam (ZOSYN) IVPB 3.375 g       3.375 g 12.5 mL/hr over 240 Minutes Intravenous 3 times per day 07/30/12 0726     07/30/12 0600   metroNIDAZOLE (FLAGYL) IVPB 500 mg  Status:  Discontinued        500 mg 100 mL/hr over 60 Minutes Intravenous Every 8 hours 07/30/12 0011 07/30/12 0726   07/29/12 2045   ciprofloxacin (CIPRO) IVPB 400 mg        400 mg 200 mL/hr over 60 Minutes Intravenous  Once 07/29/12 2042 07/29/12 2219   07/29/12 2045   metroNIDAZOLE (FLAGYL) IVPB 500 mg        500 mg 100 mL/hr over 60 Minutes Intravenous  Once 07/29/12 2042 07/29/12 2222          Assessment/Plan:  Patient Active Problem List  Diagnosis  . Perforated small intestine  . Abdominal pain, acute, bilateral lower quadrant  . Fever  Crohn's disease per CT  s/p * No surgery found *  1. DC Cipro/Flagyl  Begin Zosyn 2. Continue with conservative mangement; NPO, IVF 3. Dietary consult for diet education 4. Probable CT scan on Friday for recheck 5. Follow labs 6. Check stool for hemocult    LOS: 1 day    Jacqualynn Parco 07/30/2012

## 2012-07-30 NOTE — Clinical Social Work Psychosocial (Addendum)
    Clinical Social Work Department BRIEF PSYCHOSOCIAL ASSESSMENT 07/30/2012  Patient:  Gina Gonzalez,Gina Gonzalez     Account Number:  [id number]     Admit date:  07/29/2012  Clinical Social Worker:  Lourdes Sledge  Date/Time:  07/30/2012 02:18 PM  Referred by:  Physician  Date Referred:  07/30/2012 Referred for  Other - See comment   Other Referral:   Pt presents with questions regarding Medicaid   Interview type:  Patient Other interview type:    PSYCHOSOCIAL DATA Living Status:  FAMILY Admitted from facility:   Level of care:   Primary support name:  Gina Gonzalez [phone] Primary support relationship to patient:  PARENT Degree of support available:   Pt reports having a support system and living with her parent.    CURRENT CONCERNS Current Concerns  Other - See comment   Other Concerns:   Pt presents with questions regarding Medicaid    SOCIAL WORK ASSESSMENT / PLAN CSW received a referral to assist pt with the Medicaid process.    CSW contacted the financial counselor and was informed that pt has been contacted and informed that she currently does not qualify for disability or Medicaid. CSW informed pt that if she is accepted into the [facility] she can apply for an orange card which would be used for her visits with her PCP. Pt was very receptive and appreciative of resources. Pt also asked if she could qualify for foodstamps. CSW informed pt that pt would need to go to DSS in [facility] to apply for food stamps. CSW informed pt that she would need to provide required documentation to show proof of income. Pt stated she would do so and had no further questions.    CSW signing off.   Assessment/plan status:  No Further Intervention Required Other assessment/ plan:   Information/referral to community resources:   CSW contacted the Artist as pt is uninsured with a hx of GI problems. The financial counselor has been in contact with pt and  informed her that she could qualify for financial assistance to assist with her medical bill. CSW also informed pt that she could potentially qualify for the orange card if she is accepted into [facility] CSW also provided pt with information regarding where to go and how to apply for food stamps. CSW will inquire whether [facility] will accept pt in the [facility] clinic.    PATIENT'S/FAMILY'S RESPONSE TO PLAN OF CARE: Pt alert and oriented and pleasant to speak to. Pt appreciative of resources and information given. Pt agrees to follow up with DSS to apply for food stamps. Pt had no additional questions for CSW.    CSW signing off.

## 2012-07-30 NOTE — H&P (Signed)
[facility] Attending Admission Note: Gina Don MD Personal pager:  [phone] [facility] Service Pager:  [phone]  I  have seen and examined this patient, reviewed their chart. I have discussed this patient with the resident. I agree with the resident's findings, assessment and care plan.  Briefly, 31 yo Female with suspected Crohn's disease with 1 day history of malaise, fevers and chills, and RLQ abdominal pain admitted for intestinal microperforation found by repeat CT scan.  About 1 month prior, patient having increasing abdominal pain, presented to ED and had CT scan which pointed toward's Crohn disease.  Set up with Dr. Madilyn Fireman GI, started on Prednisone, which she has been on for about 1 month.  Fevers, malaise, abdominal pain started yesterday and she presented to [facility] office for previously scheduled appt.  Sent to ED where CT scan showed microperforation.  PE: Gen:  Alert, cooperative patient who appears stated age in no acute distress.  Vital signs reviewed. Cardiac:  Regular rate and rhythm without murmur auscultated.  Good S1/S2. Pulm:  Clear to auscultation bilaterally with good air movement.  No wheezes or rales noted.   Abdomen:  Soft/obese.  Minimal tenderness RLQ.  Good bowel sounds.  No guarding or rebound.    A/P: 1.  Microperforation:  Likely secondary to Crohn's disease.  Surgery has already seen patient.  Plan for bowel rest, IV antibiotics, and close follow-up.  Decision for surgery to be made after 24 - 48 hours of bowel rest.  She has only been on steroids for about 3 weeks total (she went 1 week without taking any Prednisone out of past 4 weeks), and therefore much less likely stress dose steroids will be needed.  Follow hemodynamic status and increase solumedrol if needed. 2.  Leukocytosis:  Marked improvement with IV fluids and antibiotics.   3.  Microcytic anemia:  Trend Hgb.  9.5 baseline for past month, decreased with IV fluids.   4.  Hypokalemia:  Persistently low K+ since 1 month  prior.  Will need replacement and follow.    Tobey Grim, MD

## 2012-07-30 NOTE — H&P (Signed)
Family Medicine Teaching [facility] Admission History and Physical Service Pager: [phone]  Patient name: Gina Gonzalez Medical record number: [id number] Date of birth: [birth year] Age: 31 y.o. Gender: female  Primary Care Provider: Pcp Not In System  Chief Complaint: Abdominal pain, fever  History of Present Illness: Gina Gonzalez is a 31 y.o. year old female with a 1 month history of abdominal pain presented to the ED for abdominal pain and fever, after her outpatient visit from Dr. Madilyn Fireman (GI) office. She experienced increase pain this morning compared to prior pain. She was seen in the office, where she was noted to be febrile, and sent for CT of the abdomen. CT of abdomen resulted with microperforation of the small bowel, probable Chron's Disease Diagnosis. She has been managed with 20 mg prednisone over the past month, and reports on the medication the pain had improved until recently. She denies vomit, fatigue, diarrhea or blood per rectum. She reports chills and being nauseated frequently and unable to eat her normal diet, she has started to implement more of a bland diet to relieve her symptoms. She last ate at 9:30 this morning.  Patient Active Problem List  Diagnosis  . Perforated small intestine  . Abdominal pain, acute, bilateral lower quadrant  . Fever   Past Medical History: History reviewed. No pertinent past medical history. Past Surgical History: Past Surgical History  Procedure Date  . Tonsillectomy    Social History: History  Substance Use Topics  . Smoking status: Never Smoker   . Smokeless tobacco: Not on file  . Alcohol Use: No   For any additional social history documentation, please refer to relevant sections of EMR.  Family History: Family History  Problem Relation Age of Onset  . Thyroid disease Mother   . Cancer Maternal Grandmother    Allergies: No Known Allergies No current facility-administered medications on file prior to encounter.    Current Outpatient Prescriptions on File Prior to Encounter  Medication Sig Dispense Refill  . ibuprofen (ADVIL,MOTRIN) 200 MG tablet Take 600 mg by mouth every 6 (six) hours as needed. For pain      . promethazine (PHENERGAN) 25 MG tablet Take 25 mg by mouth every 6 (six) hours as needed. For nausea      . Simethicone (ANTI GAS PO) Take 1 capsule by mouth daily.       Review Of Systems: Per HPI  Otherwise 12 point review of systems was performed and was unremarkable.  Physical Exam: BP 118/72  Pulse 76  Temp 97.9 F (36.6 C) (Oral)  Resp 16  SpO2 98%  LMP 07/13/2012 Exam: General: Alert. Oriented. NAD. Worried about school/work absence for admission to hospital.  HEENT: [place]. AT. EENT: WNL.  Cardiovascular: RRR. S1S2. No murmur. Respiratory: CTAB. No wheezing, rhonchi or rales. Abdomen: Soft. ND. Mildly TTP lower quadrants. No HSM or masses. BS+ Extremities: ROM WNL. +2/4 pulses bialteral UE/LE. Well perfused.  Skin: Warm and dry. Neuro: Grossly intact. No focal deficits.  Labs and Imaging:  07/29/12 CTIMPRESSION:  1. Prominent infiltrative process in the right lower quadrant is  again noted, now with locules of extraluminal gas internally  indicating microperforation. Marked wall thickening of the  terminal ileum noted. The appendix is completely obscured. Given  the overall appearance, I suspect that this represents Crohn's  disease with transmural terminal ileal inflammation, marked  surrounding inflammatory findings, and localized microperforation.  The appendix remains completely obscured, and accordingly a  smoldering appendicitis is not readily excluded although the  terminal ileum appears to be the epicenter of the process. There  is also a small amount of potentially complex free pelvic fluid in  the cul-de-sac. Orally administered contrast does extend through  to the colon.  2. 3.9 cm left ovarian cyst.  3. The presence of bilateral symmetric sacroiliitis is  commonly  associated with inflammatory bowel disease/Crohn's disease.  4. Stable likely incidental hypodense lesion in the anterior  spleen.   CBC BMET   Lab 07/29/12 1146  WBC 18.3*  HGB 9.5*  HCT 31.2*  PLT 474*    Lab 07/29/12 1146  NA 132*  K 3.3*  CL 95*  CO2 26  BUN 14  CREATININE 0.76  GLUCOSE 112*  CALCIUM 9.3     Assessment and Plan: Gina Gonzalez is a 31 y.o. year old with a 1 month history of abdominal pain presented to the ED for abdominal pain and fever, after her outpatient visit from Dr. Madilyn Fireman (GI) office.  She was seen in the office, where she was noted to be febrile with abdominal pain. She was sent for CT of the abdomen. CT of abdomen resulted with microperforation of the small bowel, probable Chron's Disease Diagnosis vs. Ulcerative colitis vs smoldering appendix. 1. Abdominal Pain: - CT: IMPRESSION:  Microperforation of small bowel. Marked wall thickening of the terminal ileum noted; suspected Crohn's disease with transmural terminal ileal inflammation, marked surrounding inflammatory findings, and localized microperforation.  - Rectal exam in the A.M - methylprednisolone 20 mg  IV daily starting tomorrow - Pain: Morphine IV - ABX: Ciprofloxacin IV and Flagyl IV - Zofran IV for nausea - Preg test: Negative - Labs:CBC, BMP, retic, CRP,  UA pending - Surgical consult  2. Anemia: - Folate, B12, iron, TIBC, CBC pending - Monitor hemoglobin closely  3. FEN/GI:  - NPO - IVF bolus x1, then 157mL/hr NS  - Social work and case management consulted Full code PPX: Lovenox, protonix IV Dispo: Pending 48 hours IV antibiotics, afebrile and surgical and GI consultation. Felix Pacini, DO 07/30/2012, 12:21 AM  PGY-2 Addendum  HPI: Briefly, this is a 31 year old woman with likely Crohn's disease presenting to the ED from GI clinic with one day history of low grade fever and tachycardia.  In the ED, she was found to have an elevated white count and  microperforation of the bowel on CT.  She denies any active abdominal pain, diarrhea, nausea or vomiting.  Last emesis 4 days ago.  Did take 1 vicodin this morning.  Started on prednisone 20mg  daily with improvement in symptoms.  No history or diarrhea, blood in stool, joint pain, eye pain, skin findings.    See excellent intern note for complete history.  Physical Exam BP 118/72  Pulse 76  Temp 97.9 F (36.6 C) (Oral)  Resp 16  SpO2 98%  LMP 07/13/2012 Gen: alert, cooperative, NAD HEENT: AT/[place], sclera white, MMM CV: RRR, no murmurs Pulm: CTAB, no wheezes or rales Abd: +BS, soft, ND, mildly tender in RLQ, no peritoneal signs Ext: no edema, 2+ DP pulses Neuro: alert, oriented, grossly normal cranial nerves and motor strength  Assessment and Plan 31 year old woman with likely new diagnosis of Crohn's Disease presenting with microperforation of ileum.  # Microperforation: Likely secondary to Crohn's disease.  Has already been seen by surgery in the ED who recommended conservative management.  Elevated white count, but no evidence for abscess on CT scan.  Otherwise asymptomatic.  - Bowel rest - IVFs for hydration -  IV cipro and flagyl - Switch prednisone to solumedrol given NPO - Will check CRP in the morning with BMP and CBC - Appreciate surgery's recommendations  # Anemia: Stable at 9.5.  Microcytic.  FOBT was negative in August. - Will check iron studies - Consider repeat FOBT  # Social: Patient without insurance. - Will consult social work and case management about orange card vs Medicaid  FEN/GI: NPO; NS at 150cc/hr PPx: SQ heparin, protonix Dispo: admit to floor  BOOTH, Vincie Linn 07/30/2012, 1:08 AM

## 2012-07-30 NOTE — Progress Notes (Signed)
Patient seen and examined.  She looks better than the CT. Would continue bowel rest and IV abxs.  Repeat CT 4-5 days from the previous one.  If she fails medical management would require ileocecectomy with possible ileostomy.

## 2012-07-30 NOTE — Progress Notes (Signed)
I discussed with Dr Sonnenberg.  I agree with their plans documented in their progress note for today.  

## 2012-07-30 NOTE — Progress Notes (Signed)
Nutrition Education Note  RD consulted for nutrition education regarding a Crohn's appropriate diet.   RD provided "Nutrition Therapy for Crohn's Disease" to pt and family.  Pt states she has done a lot of research online prior to admission.  Discussed goals of nutrition-related therapy and ways to achieve.  Expect good compliance.  Body mass index is 39.78 kg/(m^2). Pt meets criteria for obesity based on current BMI.  Current diet order is NPO. Labs and medications reviewed. No further nutrition interventions warranted at this time. RD contact information provided. If additional nutrition issues arise, please re-consult RD.  Loyce Dys, MS RD LDN Clinical Inpatient Dietitian Pager: [phone] Weekend/After hours pager: [phone]

## 2012-07-30 NOTE — Progress Notes (Signed)
Patient ID: Gina Gonzalez, female   DOB: 02/27/1981, 31 y.o.   MRN: [id number] Family Medicine Teaching Service Daily Progress Note Service Page: [phone]  Patient Assessment: 31 yo female with probable crohns disease found to have microperforations on CT abdomen admitted for bowel rest and antibiotic therapy  Subjective: Patient states doing well this morning. Only complaint is that she is hungry.  Only pain occurs when she presses on her belly and then it is 1/10.  Denies fevers and chills.  States had watery diarrhea this morning.  Objective: Temp:  [97.9 F (36.6 C)-99.2 F (37.3 C)] 98 F (36.7 C) (09/18 0536) Pulse Rate:  [76-114] 86  (09/18 0536) Resp:  [16-20] 20  (09/18 0536) BP: (106-139)/(57-79) 139/79 mmHg (09/18 0536) SpO2:  [95 %-100 %] 95 % (09/18 0536) Weight:  [254 lb (115.214 kg)] 254 lb (115.214 kg) (09/18 0050) Exam: General: NAD, resting comfortably in bed Cardiovascular: rrr, no murmurs, rubs, or gallops Respiratory: CTAB, no wheezes or crackles Abdomen: soft, minimal tenderness in RLQ and suprapubic area, ND, +BS, no masses Extremities: no edema  I have reviewed the patient's medications, labs, imaging, and diagnostic testing.  Notable results are summarized below.  CBC BMET   Lab 07/30/12 0520 07/29/12 1146  WBC 10.1 18.3*  HGB 8.3* 9.5*  HCT 27.1* 31.2*  PLT 357 474*    Lab 07/30/12 0520 07/29/12 1146  NA 139 132*  K 3.3* 3.3*  CL 103 95*  CO2 30 26  BUN 7 14  CREATININE 0.65 0.76  GLUCOSE 82 112*  CALCIUM 9.0 9.3     Results for orders placed during the hospital encounter of 07/29/12 (from the past 24 hour(s))  CBC WITH DIFFERENTIAL     Status: Abnormal   Collection Time   07/29/12 11:46 AM      Component Value Range   WBC 18.3 (*) 4.0 - 10.5 K/uL   RBC 4.10  3.87 - 5.11 MIL/uL   Hemoglobin 9.5 (*) 12.0 - 15.0 g/dL   HCT 14.7 (*) 82.9 - 56.2 %   MCV 76.1 (*) 78.0 - 100.0 fL   MCH 23.2 (*) 26.0 - 34.0 pg   MCHC 30.4  30.0 - 36.0 g/dL   RDW 13.0 (*) 86.5 - 78.4 %   Platelets 474 (*) 150 - 400 K/uL   Neutrophils Relative 82 (*) 43 - 77 %   Neutro Abs 15.0 (*) 1.7 - 7.7 K/uL   Lymphocytes Relative 9 (*) 12 - 46 %   Lymphs Abs 1.7  0.7 - 4.0 K/uL   Monocytes Relative 8  3 - 12 %   Monocytes Absolute 1.5 (*) 0.1 - 1.0 K/uL   Eosinophils Relative 0  0 - 5 %   Eosinophils Absolute 0.1  0.0 - 0.7 K/uL   Basophils Relative 0  0 - 1 %   Basophils Absolute 0.0  0.0 - 0.1 K/uL  BASIC METABOLIC PANEL     Status: Abnormal   Collection Time   07/29/12 11:46 AM      Component Value Range   Sodium 132 (*) 135 - 145 mEq/L   Potassium 3.3 (*) 3.5 - 5.1 mEq/L   Chloride 95 (*) 96 - 112 mEq/L   CO2 26  19 - 32 mEq/L   Glucose, Bld 112 (*) 70 - 99 mg/dL   BUN 14  6 - 23 mg/dL   Creatinine, Ser 6.96  0.50 - 1.10 mg/dL   Calcium 9.3  8.4 - 29.5 mg/dL  GFR calc non Af Amer >90  >90 mL/min   GFR calc Af Amer >90  >90 mL/min  POCT PREGNANCY, URINE     Status: Normal   Collection Time   07/29/12 12:07 PM      Component Value Range   Preg Test, Ur NEGATIVE  NEGATIVE  URINALYSIS, ROUTINE W REFLEX MICROSCOPIC     Status: Abnormal   Collection Time   07/29/12 12:13 PM      Component Value Range   Color, Urine YELLOW  YELLOW   APPearance CLOUDY (*) CLEAR   Specific Gravity, Urine 1.029  1.005 - 1.030   pH 5.5  5.0 - 8.0   Glucose, UA NEGATIVE  NEGATIVE mg/dL   Hgb urine dipstick NEGATIVE  NEGATIVE   Bilirubin Urine SMALL (*) NEGATIVE   Ketones, ur NEGATIVE  NEGATIVE mg/dL   Protein, ur NEGATIVE  NEGATIVE mg/dL   Urobilinogen, UA 0.2  0.0 - 1.0 mg/dL   Nitrite NEGATIVE  NEGATIVE   Leukocytes, UA NEGATIVE  NEGATIVE  BASIC METABOLIC PANEL     Status: Abnormal   Collection Time   07/30/12  5:20 AM      Component Value Range   Sodium 139  135 - 145 mEq/L   Potassium 3.3 (*) 3.5 - 5.1 mEq/L   Chloride 103  96 - 112 mEq/L   CO2 30  19 - 32 mEq/L   Glucose, Bld 82  70 - 99 mg/dL   BUN 7  6 - 23 mg/dL   Creatinine, Ser 5.62  0.50 -  1.10 mg/dL   Calcium 9.0  8.4 - 13.0 mg/dL   GFR calc non Af Amer >90  >90 mL/min   GFR calc Af Amer >90  >90 mL/min  CBC     Status: Abnormal   Collection Time   07/30/12  5:20 AM      Component Value Range   WBC 10.1  4.0 - 10.5 K/uL   RBC 3.56 (*) 3.87 - 5.11 MIL/uL   Hemoglobin 8.3 (*) 12.0 - 15.0 g/dL   HCT 86.5 (*) 78.4 - 69.6 %   MCV 76.1 (*) 78.0 - 100.0 fL   MCH 23.3 (*) 26.0 - 34.0 pg   MCHC 30.6  30.0 - 36.0 g/dL   RDW 29.5 (*) 28.4 - 13.2 %   Platelets 357  150 - 400 K/uL  RETICULOCYTES     Status: Abnormal   Collection Time   07/30/12  5:20 AM      Component Value Range   Retic Ct Pct 1.8  0.4 - 3.1 %   RBC. 3.59 (*) 3.87 - 5.11 MIL/uL   Retic Count, Manual 64.6  19.0 - 186.0 K/uL    Imaging/Diagnostic Tests: CTA abd:  Microperforation of small bowel. Marked wall thickening of the terminal ileum noted; suspected Crohn's disease with transmural terminal ileal inflammation, marked surrounding inflammatory findings, and localized microperforation.  Plan: Gina Gonzalez is a 31 y.o. year old with a 1 month history of abdominal pain presented to the ED for abdominal pain and fever, after her outpatient visit from Dr. Madilyn Fireman (GI) office. She was seen in the office, where she was noted to be febrile with abdominal pain. She was sent for CT of the abdomen. CT of abdomen resulted with microperforation of the small bowel, probable Chron's Disease Diagnosis vs. Ulcerative colitis vs smoldering appendix.   1. Abdominal Pain: likely a result of microperforation related to new crohns diagnosis. - CT: Microperforation of small bowel.  Marked wall thickening of the terminal ileum noted; suspected Crohn's disease with transmural terminal ileal inflammation, marked surrounding inflammatory findings, and localized microperforation.   - methylprednisolone 20 mg IV daily  - Pain: Morphine IV-has not required any - ABX: Ciprofloxacin IV and Flagyl IV d/c'd and surgery transitioned to zosyn  IV - Zofran IV for nausea  - Preg test: Negative   - Surgical has seen and recs medical management for the time being. Repeat CT 4-5 days from the previous one. If she fails medical management would require ileocecectomy with possible ileostomy.  2. Anemia:  - Folate, B12, iron, TIBC, CBC pending  - Hemoglobin continues to drop-could be a result of crohns, or possibly drop overnight to 8.3 due to dilution   3. Hypokalemia: K at 3.3, will replete with KCl IV 10 mEq x3.  4. FEN/GI:  - NPO  - IVF bolus x1, then 140mL/hr NS  - Social work and case management consulted to help patient set up medicaid Full code  PPX: Lovenox, protonix IV  Dispo: Pending 48 hours IV antibiotics, afebrile and surgical and GI consultation.   Marikay Alar, MD 07/30/2012, 8:57 AM

## 2012-07-31 LAB — BASIC METABOLIC PANEL
BUN: 5 mg/dL — ABNORMAL LOW (ref 6–23)
Chloride: 103 mEq/L (ref 96–112)
GFR calc Af Amer: >90 mL/min (ref >90)
Glucose, Bld: 116 mg/dL — ABNORMAL HIGH (ref 70–99)
Potassium: 3.6 mEq/L (ref 3.5–5.1)
Sodium: 137 mEq/L (ref 135–145)

## 2012-07-31 LAB — CBC
HCT: 28.1 % — ABNORMAL LOW (ref 36.0–46.0)
Hemoglobin: 8.5 g/dL — ABNORMAL LOW (ref 12.0–15.0)
MCH: 23 pg — ABNORMAL LOW (ref 26.0–34.0)
MCHC: 30.2 g/dL (ref 30.0–36.0)
RBC: 3.7 MIL/uL — ABNORMAL LOW (ref 3.87–5.11)

## 2012-07-31 MED ORDER — CHLORHEXIDINE GLUCONATE 0.12 % MT SOLN
15.0000 mL | Freq: Two times a day (BID) | OROMUCOSAL | Status: DC
  Administered 2012-07-31 – 2012-08-03 (×7): 15 mL via OROMUCOSAL
  Filled 2012-07-31 (×7): qty 15

## 2012-07-31 MED ORDER — METHYLPREDNISOLONE SODIUM SUCC 40 MG IJ SOLR
20.0000 mg | Freq: Every day | INTRAMUSCULAR | Status: DC
  Administered 2012-08-01: 20 mg via INTRAVENOUS
  Filled 2012-07-31 (×2): qty 0.5

## 2012-07-31 MED ORDER — BIOTENE DRY MOUTH MT LIQD
15.0000 mL | Freq: Two times a day (BID) | OROMUCOSAL | Status: DC
  Administered 2012-07-31 – 2012-08-03 (×7): 15 mL via OROMUCOSAL

## 2012-07-31 MED ORDER — ACETAMINOPHEN 325 MG PO TABS
650.0000 mg | ORAL_TABLET | Freq: Four times a day (QID) | ORAL | Status: DC | PRN
  Administered 2012-08-03: 650 mg via ORAL
  Filled 2012-07-31 (×2): qty 2

## 2012-07-31 NOTE — Progress Notes (Signed)
Patient ID: Gina Gonzalez, female   DOB: 08-03-1981, 31 y.o.   MRN: [id number]    Subjective: Pt repports abdomen feels much better today.  Denies nausea or vomiting.  Wants something to eat.  +BM  Objective: Vital signs in last 24 hours: Temp:  [98.1 F (36.7 C)-98.6 F (37 C)] 98.1 F (36.7 C) (09/19 0609) Pulse Rate:  [66-81] 81  (09/19 0609) Resp:  [18-20] 18  (09/19 0609) BP: (117-123)/(54-73) 122/58 mmHg (09/19 0609) SpO2:  [96 %-100 %] 96 % (09/19 0609) Last BM Date: 07/30/12  Intake/Output from previous day: 09/18 0701 - 09/19 0700 In: 1579 [I.V.:1229; IV Piggyback:350] Out: 7 [Urine:7] Intake/Output this shift:   Physical Exam: General appearance: alert, cooperative, appears stated age, no distress and moderately obese Lungs: CTA bilaterally heart: RRR, No M/R/G Abdomen: obese, soft, only minimally tender, +BS    Lab Results:   [facility] 07/31/12 0530 07/30/12 0520  WBC 12.7* 10.1  HGB 8.5* 8.3*  HCT 28.1* 27.1*  PLT 424* 357   BMET  Basename 07/31/12 0530 07/30/12 0520  NA 137 139  K 3.6 3.3*  CL 103 103  CO2 29 30  GLUCOSE 116* 82  BUN 5* 7  CREATININE 0.75 0.65  CALCIUM 9.4 9.0   PT/INR No results found for this basename: LABPROT:2,INR:2 in the last 72 hours ABG No results found for this basename: PHART:2,PCO2:2,PO2:2,HCO3:2 in the last 72 hours  Studies/Results: Ct Abdomen Pelvis W Contrast  07/29/2012  *RADIOLOGY REPORT*  Clinical Data: Abdominal pain.  Fever.  Leukocytosis.  Right lower quadrant inflammatory process.  CT ABDOMEN AND PELVIS WITH CONTRAST  Technique:  Multidetector CT imaging of the abdomen and pelvis was performed following the standard protocol during bolus administration of intravenous contrast.  Contrast: OMNIPAQUE IOHEXOL 300 MG/ML  SOLN  Comparison: 06/28/2012  Findings: Fluid density lesion along the anterior splenic capsule measuring 1.7 cm on image 12 of series 2 appears stable. Otherwise, the liver, spleen, pancreas,  and adrenal glands appear unremarkable.  The gallbladder and biliary system appear unremarkable.  The kidneys appear unremarkable, as do the proximal ureters.  Prominent infiltrative process in the right lower quadrant adjacent to the cecum is observed with prominent wall thickening of the distal ileum, extraluminal locules of gas in the inflammatory mesenteric process, and adjacent enlarged lymph nodes in the region. There is thickening of the inferior wall of the cecum. Orally measured contrast medium does appear to extend through the terminal ileum to the cecum, with a "string sign" in the terminal ileum and possible ulceration along the terminal ileum as well. The appendix is obscured completely.  Free pelvic fluid in the cul-de-sac is likely mildly complex. There is also free fluid adjacent to a 3.9 x 3.2 cm cystic lesion of the left ovary.  No other bowel lesions are observed.  There is sclerosis in the sacroiliac joints favoring sacroiliitis which appears bilaterally symmetric.  IMPRESSION:  1.  Prominent infiltrative process in the right lower quadrant is again noted, now with locules of extraluminal gas internally indicating microperforation.  Marked wall thickening of the terminal ileum noted.  The appendix is completely obscured.  Given the overall appearance, I suspect that this represents Crohn's disease with transmural terminal ileal inflammation, marked surrounding inflammatory findings, and localized microperforation. The appendix remains completely obscured, and accordingly a smoldering appendicitis is not readily excluded although the terminal ileum appears to be the epicenter of the process.  There is also a small amount of potentially complex free  pelvic fluid in the cul-de-sac.  Orally administered contrast does extend through to the colon. 2.  3.9 cm left ovarian cyst. 3.  The presence of bilateral symmetric sacroiliitis is commonly associated with inflammatory bowel disease/Crohn's disease. 4.   Stable likely incidental hypodense lesion in the anterior spleen.   Original Report Authenticated By: Dellia Cloud, M.D.     Anti-infectives: Anti-infectives     Start     Dose/Rate Route Frequency Ordered Stop   07/30/12 0900   ciprofloxacin (CIPRO) IVPB 400 mg  Status:  Discontinued        400 mg 200 mL/hr over 60 Minutes Intravenous Every 12 hours 07/30/12 0011 07/30/12 0726   07/30/12 0730   piperacillin-tazobactam (ZOSYN) IVPB 3.375 g        3.375 g 12.5 mL/hr over 240 Minutes Intravenous 3 times per day 07/30/12 0726     07/30/12 0600   metroNIDAZOLE (FLAGYL) IVPB 500 mg  Status:  Discontinued        500 mg 100 mL/hr over 60 Minutes Intravenous Every 8 hours 07/30/12 0011 07/30/12 0726   07/29/12 2045   ciprofloxacin (CIPRO) IVPB 400 mg        400 mg 200 mL/hr over 60 Minutes Intravenous  Once 07/29/12 2042 07/29/12 2219   07/29/12 2045   metroNIDAZOLE (FLAGYL) IVPB 500 mg        500 mg 100 mL/hr over 60 Minutes Intravenous  Once 07/29/12 2042 07/29/12 2222          Assessment/Plan:  Patient Active Problem List  Diagnosis  . Perforated small intestine  . Abdominal pain, acute, bilateral lower quadrant  . Fever  Crohn's disease per CT  --better today  --Repeat CT tomorrow for follow up  --Allow sips of clears today but no diet  --Cont zosyn  --Cont IVFs  --recheck labs in AM     LOS: 2 days    Gina Gonzalez 07/31/2012

## 2012-07-31 NOTE — Progress Notes (Signed)
Patient seen and examined.  WBC up some today.  Need to minimize her steroids in order to try to allow the infectious process to heal.

## 2012-07-31 NOTE — Consult Note (Signed)
Subjective:   HPI  30 year old female who has been having RLQ intermittent abdominal pain since July. She went to the ER a few weeks ago and a CT was suggestive of Crohn's. She was given a 7 day course of Prednisone 20mg . She saw Dr Dorena Cookey in the office who concurred with the diagnosis of Crohn's and put her on a higher dose of Prednisone and tapered to 20mg . She was seen again in follow up and sent to the ER with worsening pain and was admitted after another CT showed evidence of Crohn's in the terminal ileum with microperforation. She is on antibiotic. Dr Abbey Chatters from surgery wondered if we could decrease her steroid dosage to allow infection a better chance to heal. Patient feels good this evening and has no pain.  Review of Systems  History reviewed. No pertinent past medical history. Past Surgical History  Procedure Date  . Tonsillectomy    History   Social History  . Marital Status: Single    Spouse Name: N/A    Number of Children: N/A  . Years of Education: N/A   Occupational History  . Not on file.   Social History Main Topics  . Smoking status: Never Smoker   . Smokeless tobacco: Not on file  . Alcohol Use: No  . Drug Use: No  . Sexually Active: Not Currently    Birth Control/ Protection: None   Other Topics Concern  . Not on file   Social History Narrative  . No narrative on file   family history includes Cancer in her maternal grandmother and Thyroid disease in her mother. Current facility-administered medications:acetaminophen (TYLENOL) suppository 650 mg, 650 mg, Rectal, Q6H PRN, Phebe Colla, MD, 650 mg at 07/30/12 0202;  antiseptic oral rinse (BIOTENE) solution 15 mL, 15 mL, Mouth Rinse, q12n4p, Tobey Grim, MD, 15 mL at 07/31/12 1140;  chlorhexidine (PERIDEX) 0.12 % solution 15 mL, 15 mL, Mouth Rinse, BID, Tobey Grim, MD, 15 mL at 07/31/12 0836 dextrose 5 %-0.45 % sodium chloride infusion, , Intravenous, Continuous, Glori Luis, MD, Last  Rate: 150 mL/hr at 07/31/12 1140, 1,000 mL at 07/31/12 1140;  heparin injection 5,000 Units, 5,000 Units, Subcutaneous, Q8H, Phebe Colla, MD;  methylPREDNISolone sodium succinate (SOLU-MEDROL) 40 mg/mL injection 30 mg, 30 mg, Intravenous, Q12H, Renee A Kuneff, DO, 30 mg at 07/31/12 0606 morphine 2 MG/ML injection 1 mg, 1 mg, Intravenous, Q2H PRN, Phebe Colla, MD;  ondansetron [facility]) injection 4 mg, 4 mg, Intravenous, Q6H PRN, Phebe Colla, MD;  pantoprazole (PROTONIX) injection 40 mg, 40 mg, Intravenous, Q24H, Phebe Colla, MD, 40 mg at 07/30/12 2135;  piperacillin-tazobactam (ZOSYN) IVPB 3.375 g, 3.375 g, Intravenous, Q8H, Adolph Pollack, MD, 3.375 g at 07/31/12 1409 No Known Allergies   Objective:     BP 117/58  Pulse 65  Temp 98.1 F (36.7 C) (Oral)  Resp 18  Ht 5\' 7"  (1.702 m)  Wt 115.214 kg (254 lb)  BMI 39.78 kg/m2  SpO2 100%  LMP 07/13/2012  No distress Heart RRR Lungs clear Abdomen is soft and non tender.  Laboratory No components found with this basename: d1      Assessment:     Crohn's of terminal ileum with microperforation.      Plan:     Continue antibiotics. I think it would be ok to lower her dose of steroids. I think we could advance her diet to full liquids.

## 2012-07-31 NOTE — Progress Notes (Signed)
I discussed with Dr Sonnenberg.  I agree with their plans documented in their progress note for today.  

## 2012-07-31 NOTE — Progress Notes (Signed)
Patient ID: Gina Gonzalez, female   DOB: Jul 19, 1981, 31 y.o.   MRN: [id number] Family Medicine Teaching Service Daily Progress Note Service Page: [phone]  Patient Assessment: 31 yo female with probable crohns disease found to have microperforations on CT abdomen admitted for bowel rest and antibiotic therapy  Subjective: Patient states doing well this morning. States pain is gone. No tenderness in abdomen.  States surgery came by to see her and going to advance to sips today and see how she does with this  Objective: Temp:  [98.1 F (36.7 C)-98.6 F (37 C)] 98.1 F (36.7 C) (09/19 0609) Pulse Rate:  [66-81] 81  (09/19 0609) Resp:  [18-20] 18  (09/19 0609) BP: (117-123)/(54-73) 122/58 mmHg (09/19 0609) SpO2:  [96 %-100 %] 96 % (09/19 0609) Exam: General: NAD, resting comfortably in bed Cardiovascular: rrr, no murmurs, rubs, or gallops Respiratory: CTAB, no wheezes or crackles Abdomen: soft, NT, ND, +BS, no masses Extremities: no edema  I have reviewed the patient's medications, labs, imaging, and diagnostic testing.  Notable results are summarized below.  CBC BMET   Lab 07/31/12 0530 07/30/12 0520 07/29/12 1146  WBC 12.7* 10.1 18.3*  HGB 8.5* 8.3* 9.5*  HCT 28.1* 27.1* 31.2*  PLT 424* 357 474*    Lab 07/31/12 0530 07/30/12 0520 07/29/12 1146  NA 137 139 132*  K 3.6 3.3* 3.3*  CL 103 103 95*  CO2 29 30 26   BUN 5* 7 14  CREATININE 0.75 0.65 0.76  GLUCOSE 116* 82 112*  CALCIUM 9.4 9.0 9.3     Results for orders placed during the hospital encounter of 07/29/12 (from the past 24 hour(s))  OCCULT BLOOD X 1 CARD TO LAB, STOOL     Status: Normal   Collection Time   07/30/12  4:47 PM      Component Value Range   Fecal Occult Bld NEGATIVE    OCCULT BLOOD X 1 CARD TO LAB, STOOL     Status: Normal   Collection Time   07/30/12  9:01 PM      Component Value Range   Fecal Occult Bld NEGATIVE    BASIC METABOLIC PANEL     Status: Abnormal   Collection Time   07/31/12  5:30 AM      Component Value Range   Sodium 137  135 - 145 mEq/L   Potassium 3.6  3.5 - 5.1 mEq/L   Chloride 103  96 - 112 mEq/L   CO2 29  19 - 32 mEq/L   Glucose, Bld 116 (*) 70 - 99 mg/dL   BUN 5 (*) 6 - 23 mg/dL   Creatinine, Ser 1.47  0.50 - 1.10 mg/dL   Calcium 9.4  8.4 - 82.9 mg/dL   GFR calc non Af Amer >90  >90 mL/min   GFR calc Af Amer >90  >90 mL/min  CBC     Status: Abnormal   Collection Time   07/31/12  5:30 AM      Component Value Range   WBC 12.7 (*) 4.0 - 10.5 K/uL   RBC 3.70 (*) 3.87 - 5.11 MIL/uL   Hemoglobin 8.5 (*) 12.0 - 15.0 g/dL   HCT 56.2 (*) 13.0 - 86.5 %   MCV 75.9 (*) 78.0 - 100.0 fL   MCH 23.0 (*) 26.0 - 34.0 pg   MCHC 30.2  30.0 - 36.0 g/dL   RDW 78.4 (*) 69.6 - 29.5 %   Platelets 424 (*) 150 - 400 K/uL    Imaging/Diagnostic Tests:  CTA abd:  Microperforation of small bowel. Marked wall thickening of the terminal ileum noted; suspected Crohn's disease with transmural terminal ileal inflammation, marked surrounding inflammatory findings, and localized microperforation.  Plan: Gina Gonzalez is a 31 y.o. year old with a 1 month history of abdominal pain presented to the ED for abdominal pain and fever, after her outpatient visit from Dr. Madilyn Fireman (GI) office. She was seen in the office, where she was noted to be febrile with abdominal pain. She was sent for CT of the abdomen. CT of abdomen revealed microperforation of the small bowel, probable Chron's Disease Diagnosis vs. Ulcerative colitis vs smoldering appendix.   1. Abdominal Pain: likely a result of microperforation related to new crohns diagnosis. - CT: Microperforation of small bowel. Marked wall thickening of the terminal ileum noted; suspected Crohn's disease with transmural terminal ileal inflammation, marked surrounding inflammatory findings, and localized microperforation.   - methylprednisolone 20 mg IV daily-will transition to prednisone 60 mg daily PO at time of discharge - Pain: Morphine IV-has not  required any - ABX: Ciprofloxacin IV and Flagyl IV d/c'd and surgery transitioned to zosyn IV - Zofran IV for nausea  - Surgery is following and we appreciate their help. Recs medical management for the time being. Repeat CT on Friday. Advance to sips today. If she fails medical management would require ileocecectomy with possible ileostomy.  2. Anemia: likely related to crohns, will continue to monitor - anemia panel provides mixed picture-iron 14, TIBC 197, Ferritin 51, retic count 1.8, folate 14.1, B12 242 - FOBT negative - Hb stable overnight - will start iron replacement  3. Hypokalemia: repleted with KCl IV 10 mEq x3. Now 3.6.  4. FEN/GI:  - NPO, sips per surgery recs - 113mL/hr NS  Full code  PPX: Lovenox, protonix IV  Dispo: Pending 48 hours IV antibiotics, afebrile and improved CT abdomen.   Marikay Alar, MD 07/31/2012, 8:56 AM

## 2012-08-01 LAB — BASIC METABOLIC PANEL
BUN: 5 mg/dL — ABNORMAL LOW (ref 6–23)
CO2: 24 mEq/L (ref 19–32)
GFR calc non Af Amer: >90 mL/min (ref >90)
Glucose, Bld: 96 mg/dL (ref 70–99)
Potassium: 3.5 mEq/L (ref 3.5–5.1)
Sodium: 140 mEq/L (ref 135–145)

## 2012-08-01 LAB — CBC
Hemoglobin: 9.7 g/dL — ABNORMAL LOW (ref 12.0–15.0)
MCH: 23.3 pg — ABNORMAL LOW (ref 26.0–34.0)
MCHC: 30.2 g/dL (ref 30.0–36.0)
MCV: 77.2 fL — ABNORMAL LOW (ref 78.0–100.0)
RBC: 4.16 MIL/uL (ref 3.87–5.11)

## 2012-08-01 LAB — GLUCOSE, CAPILLARY: Glucose-Capillary: 96 mg/dL (ref 70–99)

## 2012-08-01 MED ORDER — POTASSIUM CHLORIDE CRYS ER 20 MEQ PO TBCR
40.0000 meq | EXTENDED_RELEASE_TABLET | Freq: Once | ORAL | Status: AC
  Administered 2012-08-01: 40 meq via ORAL
  Filled 2012-08-01 (×2): qty 2

## 2012-08-01 NOTE — Progress Notes (Signed)
I have seen and examined this patient. I have discussed with Dr Funches.  I agree with their findings and plans as documented in their progress note.    

## 2012-08-01 NOTE — Progress Notes (Signed)
[facility] PROGRESS NOTE Subjective Tolerating  FLs without pain.  Objective: Vital signs in last 24 hours: Temp:  [97.8 F (36.6 C)-98.4 F (36.9 C)] 98.4 F (36.9 C) (09/20 0622) Pulse Rate:  [65-75] 70  (09/20 0622) Resp:  [18] 18  (09/20 0622) BP: (107-117)/(48-58) 114/53 mmHg (09/20 0622) SpO2:  [99 %-100 %] 99 % (09/20 0622) Weight:  [113.1 kg (249 lb 5.4 oz)] 113.1 kg (249 lb 5.4 oz) (09/20 0622) Last BM Date: 07/31/12  Intake/Output from previous day: 09/19 0701 - 09/20 0700 In: 2438.2 [P.O.:60; I.V.:2278.2; IV Piggyback:100] Out: 3 [Urine:3] Intake/Output this shift:    PE: Abd-soft and nontender  Lab Results:  Basename 07/31/12 0530 07/30/12 0520 07/29/12 1146  WBC 12.7* 10.1 18.3*  HGB 8.5* 8.3* 9.5*  HCT 28.1* 27.1* 31.2*  PLT 424* 357 474*   BMET  Basename 08/01/12 0600 07/31/12 0530 07/30/12 0520 07/29/12 1146  NA 140 137 139 132*  K 3.5 3.6 3.3* 3.3*  CL 105 103 103 95*  CO2 24 29 30 26   CREATININE 0.80 0.75 0.65 0.76   LFT No results found for this basename: PROT:3ALBUMIN:3,AST:3,ALT:3,ALKPHOS:3,BILITOT:3,BILIDIR:3,IBILI:3 in the last 72 hours PT/INR No results found for this basename: LABPROT:3,INR:3 in the last 72 hours PANCREAS No results found for this basename: LIPASE:3 in the last 72 hours       Studies/Results: No results found.  Medications: I have reviewed the patient's current medications.  Assessment/Plan: 1. Crohns with microperf. Doing ok with AB and tolerating the diet. Continue to follow after feeding, AB and steroids may need to repeat the CT in near future.   Laniqua Torrens JR,Kahlee Metivier L 08/01/2012, 8:51 AM

## 2012-08-01 NOTE — Progress Notes (Signed)
  Subjective: Continues to feel better, gassy,and some loose stools, but no pain in abdomen this AM.  Objective: Vital signs in last 24 hours: Temp:  [97.8 F (36.6 C)-98.4 F (36.9 C)] 98.4 F (36.9 C) (09/20 0622) Pulse Rate:  [65-75] 70  (09/20 0622) Resp:  [18] 18  (09/20 0622) BP: (107-117)/(48-58) 114/53 mmHg (09/20 0622) SpO2:  [99 %-100 %] 99 % (09/20 0622) Weight:  [113.1 kg (249 lb 5.4 oz)] 113.1 kg (249 lb 5.4 oz) (09/20 0622) Last BM Date: 07/31/12  Afebrile, VSS,BMP OK, no WBC today  Intake/Output from previous day: 09/19 0701 - 09/20 0700 In: 2438.2 [P.O.:60; I.V.:2278.2; IV Piggyback:100] Out: 3 [Urine:3] Intake/Output this shift:    General appearance: alert, cooperative and no distress GI: soft, non-tender; bowel sounds normal; no masses,  no organomegaly  Lab Results:   [facility] 07/31/12 0530 07/30/12 0520  WBC 12.7* 10.1  HGB 8.5* 8.3*  HCT 28.1* 27.1*  PLT 424* 357    BMET  Basename 08/01/12 0600 07/31/12 0530  NA 140 137  K 3.5 3.6  CL 105 103  CO2 24 29  GLUCOSE 96 116*  BUN 5* 5*  CREATININE 0.80 0.75  CALCIUM 9.1 9.4   PT/INR No results found for this basename: LABPROT:2,INR:2 in the last 72 hours  No results found for this basename: AST:5,ALT:5,ALKPHOS:5,BILITOT:5,PROT:5,ALBUMIN:5 in the last 168 hours   Lipase     Component Value Date/Time   LIPASE 26 06/28/2012 1538     Studies/Results: No results found.  Medications:    . antiseptic oral rinse  15 mL Mouth Rinse q12n4p  . chlorhexidine  15 mL Mouth Rinse BID  . heparin  5,000 Units Subcutaneous Q8H  . methylPREDNISolone (SOLU-MEDROL) injection  20 mg Intravenous Daily  . pantoprazole (PROTONIX) IV  40 mg Intravenous Q24H  . piperacillin-tazobactam (ZOSYN)  IV  3.375 g Intravenous Q8H  . DISCONTD: methylPREDNISolone (SOLU-MEDROL) injection  30 mg Intravenous Q12H    Assessment/Plan  Probable Crohn's disease with microperforation On Steroids, dose being tapered by  DR. Ganem down to 20mg  solumedrol today. Diet advanced to full liquids/Dr. Evette Cristal.   Plan:  Continue medical management.    LOS: 3 days    Gina Gonzalez 08/01/2012

## 2012-08-01 NOTE — Progress Notes (Signed)
Patient ID: Gina Gonzalez, female   DOB: Apr 12, 1981, 31 y.o.   MRN: [id number] Family Medicine Teaching Service Daily Progress Note Service Page: [phone]  Patient Assessment: 31 yo female with probable crohns disease found to have microperforations on CT abdomen admitted for bowel rest and antibiotic therapy.   Subjective: No complaints this AM. Denies abdominal pain. Tolerating clear liquid diet without nausea. Has loose stools without bleeding.   Objective: Temp:  [97.8 F (36.6 C)-98.4 F (36.9 C)] 98.4 F (36.9 C) (09/20 0622) Pulse Rate:  [65-75] 70  (09/20 0622) Resp:  [18] 18  (09/20 0622) BP: (107-117)/(48-58) 114/53 mmHg (09/20 0622) SpO2:  [99 %-100 %] 99 % (09/20 0622) Weight:  [249 lb 5.4 oz (113.1 kg)] 249 lb 5.4 oz (113.1 kg) (09/20 0622) Exam: General: NAD, walking around room.  Cardiovascular: rrr, no murmurs, rubs, or gallops Respiratory: CTAB, no wheezes or crackles Abdomen: soft, NT, ND, +BS, no masses Extremities: no edema  I have reviewed the patient's medications, labs, imaging, and diagnostic testing.  Notable results are summarized below.  CBC BMET   Lab 08/01/12 0929 07/31/12 0530 07/30/12 0520  WBC 13.3* 12.7* 10.1  HGB 9.7* 8.5* 8.3*  HCT 32.1* 28.1* 27.1*  PLT 480* 424* 357    Lab 08/01/12 0600 07/31/12 0530 07/30/12 0520  NA 140 137 139  K 3.5 3.6 3.3*  CL 105 103 103  CO2 24 29 30   BUN 5* 5* 7  CREATININE 0.80 0.75 0.65  GLUCOSE 96 116* 82  CALCIUM 9.1 9.4 9.0     Results for orders placed during the hospital encounter of 07/29/12 (from the past 24 hour(s))  BASIC METABOLIC PANEL     Status: Abnormal   Collection Time   08/01/12  6:00 AM      Component Value Range   Sodium 140  135 - 145 mEq/L   Potassium 3.5  3.5 - 5.1 mEq/L   Chloride 105  96 - 112 mEq/L   CO2 24  19 - 32 mEq/L   Glucose, Bld 96  70 - 99 mg/dL   BUN 5 (*) 6 - 23 mg/dL   Creatinine, Ser 8.46  0.50 - 1.10 mg/dL   Calcium 9.1  8.4 - 96.2 mg/dL   GFR calc non Af  Amer >90  >90 mL/min   GFR calc Af Amer >90  >90 mL/min  GLUCOSE, CAPILLARY     Status: Normal   Collection Time   08/01/12  7:41 AM      Component Value Range   Glucose-Capillary 96  70 - 99 mg/dL   Comment 1 Notify RN    CBC     Status: Abnormal   Collection Time   08/01/12  9:29 AM      Component Value Range   WBC 13.3 (*) 4.0 - 10.5 K/uL   RBC 4.16  3.87 - 5.11 MIL/uL   Hemoglobin 9.7 (*) 12.0 - 15.0 g/dL   HCT 95.2 (*) 84.1 - 32.4 %   MCV 77.2 (*) 78.0 - 100.0 fL   MCH 23.3 (*) 26.0 - 34.0 pg   MCHC 30.2  30.0 - 36.0 g/dL   RDW 40.1 (*) 02.7 - 25.3 %   Platelets 480 (*) 150 - 400 K/uL    Imaging/Diagnostic Tests: CTA abd:  Microperforation of small bowel. Marked wall thickening of the terminal ileum noted; suspected Crohn's disease with transmural terminal ileal inflammation, marked surrounding inflammatory findings, and localized microperforation.  Assessment and Plan: Gina Gonzalez is  a 31 y.o. year old with a 1 month history of abdominal pain presented to the ED for abdominal pain and fever, after her outpatient visit from Dr. Madilyn Fireman (GI) office. She was seen in the office, where she was noted to be febrile with abdominal pain. She was sent for CT of the abdomen. CT of abdomen revealed microperforation of the small bowel, probable Chron's Disease Diagnosis vs. Ulcerative colitis vs smoldering appendix.   1. Abdominal Pain: likely a result of microperforation related to new crohns diagnosis. A: Improving. Pain free. Afebrile. WBC trending down.  P: - methylprednisolone 20 mg IV daily-will transition to equivalent dose of prednisone 30-20 mg at time of discharge.  - ABX: Ciprofloxacin IV and Flagyl IV d/c'd and surgery transitioned to zosyn IV. Will transition back to Cipo and Flagyl to complete a 7 day course at time of discharge.  - For pain and nausea control:  Morphine IV and Zofran IV prn.  - Surgery and GI is following and we appreciate their help. I have page general  surgery regarding timing of repeat CT given normal clinical exam. Plan per General surgery is to repeat the abdominal CT scan on 08/03/12. The general surgery team recommends that the patient waits in the hospital until the repeat CT scan is obtained.   2. Anemia: likely related to crohns, will continue to monitor - anemia panel provides mixed picture-iron 14, TIBC 197, Ferritin 51, retic count 1.8, folate 14.1, B12 242 - FOBT negative - Hb stable overnight - will continue iron replacement  3. Hypokalemia: repleted with KCl IV 10 mEq x3. Now 3.5. Ordered an additional 40 mEq PO.   4. FEN/GI:  F: decreased IV to 75 ml/hr E: hypokalemic as per above.  N: full liquid diet.   Code status: Full code   PPX: Lovenox, protonix IV   Dispo: Pending continued normal vitals and clinical exam and decision regarding repeat abdominal CT scan.  Dessa Phi, MD 08/01/2012, 1:09 PM

## 2012-08-01 NOTE — Progress Notes (Signed)
Patient seen and examined.  She has developed some mild RLQ pain once the steroid dose was decreased.  WBC up some.  If she continues to have more pain and the WBC continues to rise, will need CT tomorrow and if worse, will probably need an ileocecectomy.

## 2012-08-02 LAB — BASIC METABOLIC PANEL
BUN: 3 mg/dL — ABNORMAL LOW (ref 6–23)
CO2: 27 mEq/L (ref 19–32)
Chloride: 105 mEq/L (ref 96–112)
Creatinine, Ser: 0.77 mg/dL (ref 0.50–1.10)
GFR calc Af Amer: >90 mL/min (ref >90)
Glucose, Bld: 90 mg/dL (ref 70–99)
Potassium: 3.3 mEq/L — ABNORMAL LOW (ref 3.5–5.1)

## 2012-08-02 LAB — CBC
HCT: 29.5 % — ABNORMAL LOW (ref 36.0–46.0)
Hemoglobin: 8.8 g/dL — ABNORMAL LOW (ref 12.0–15.0)
MCH: 22.7 pg — ABNORMAL LOW (ref 26.0–34.0)
MCHC: 29.8 g/dL — ABNORMAL LOW (ref 30.0–36.0)
MCV: 76 fL — ABNORMAL LOW (ref 78.0–100.0)
RDW: 16.3 % — ABNORMAL HIGH (ref 11.5–15.5)

## 2012-08-02 MED ORDER — POTASSIUM CHLORIDE CRYS ER 20 MEQ PO TBCR
40.0000 meq | EXTENDED_RELEASE_TABLET | Freq: Once | ORAL | Status: AC
  Administered 2012-08-02: 40 meq via ORAL
  Filled 2012-08-02 (×2): qty 2

## 2012-08-02 MED ORDER — POTASSIUM CHLORIDE 10 MEQ/100ML IV SOLN: 10.0000 meq | INTRAVENOUS | Status: DC

## 2012-08-02 MED ORDER — METHYLPREDNISOLONE SODIUM SUCC 40 MG IJ SOLR
10.0000 mg | Freq: Once | INTRAMUSCULAR | Status: AC
  Administered 2012-08-02: 10 mg via INTRAVENOUS
  Filled 2012-08-02: qty 0.25

## 2012-08-02 NOTE — Progress Notes (Signed)
FMTS Attending Note Patient seen and examined by me, discussed with Dr Birdie Sons and I agree with his assess/plan. Patient is well appearing, ambulating easily and with no tenderness to palpation of abdomen. Tolerating full liq diet well.    Plan for repeat CT in am for reassessment of microperforation. Appreciate Surgery service consult opinion.  Paula Compton, MD

## 2012-08-02 NOTE — Progress Notes (Signed)
Patient ID: Gina Gonzalez, female   DOB: 29-May-1981, 31 y.o.   MRN: [id number] Family Medicine Teaching Service Daily Progress Note Service Page: [phone]  Patient Assessment: 31 yo female with probable crohns disease found to have microperforations on CT abdomen admitted for bowel rest and antibiotic therapy.   Subjective: No complaints this AM. Denies abdominal pain. Tolerating full liquid diet.   Objective: Temp:  [98.4 F (36.9 C)-98.6 F (37 C)] 98.5 F (36.9 C) (09/21 1478) Pulse Rate:  [70-76] 70  (09/21 0632) Resp:  [18] 18  (09/21 2956) BP: (110-114)/(46-62) 111/62 mmHg (09/21 0632) SpO2:  [97 %-99 %] 97 % (09/21 2130) Exam: General: NAD, walking around room.  Cardiovascular: rrr, no murmurs, rubs, or gallops Respiratory: CTAB, no wheezes or crackles Abdomen: soft, NT, ND, +BS, no masses Extremities: no edema  I have reviewed the patient's medications, labs, imaging, and diagnostic testing.  Notable results are summarized below.  CBC BMET   Lab 08/02/12 0615 08/01/12 0929 07/31/12 0530  WBC 10.5 13.3* 12.7*  HGB 8.8* 9.7* 8.5*  HCT 29.5* 32.1* 28.1*  PLT 431* 480* 424*    Lab 08/02/12 0615 08/01/12 0600 07/31/12 0530  NA 142 140 137  K 3.3* 3.5 3.6  CL 105 105 103  CO2 27 24 29   BUN 3* 5* 5*  CREATININE 0.77 0.80 0.75  GLUCOSE 90 96 116*  CALCIUM 9.3 9.1 9.4     Results for orders placed during the hospital encounter of 07/29/12 (from the past 24 hour(s))  CBC     Status: Abnormal   Collection Time   08/01/12  9:29 AM      Component Value Range   WBC 13.3 (*) 4.0 - 10.5 K/uL   RBC 4.16  3.87 - 5.11 MIL/uL   Hemoglobin 9.7 (*) 12.0 - 15.0 g/dL   HCT 86.5 (*) 78.4 - 69.6 %   MCV 77.2 (*) 78.0 - 100.0 fL   MCH 23.3 (*) 26.0 - 34.0 pg   MCHC 30.2  30.0 - 36.0 g/dL   RDW 29.5 (*) 28.4 - 13.2 %   Platelets 480 (*) 150 - 400 K/uL  BASIC METABOLIC PANEL     Status: Abnormal   Collection Time   08/02/12  6:15 AM      Component Value Range   Sodium 142  135  - 145 mEq/L   Potassium 3.3 (*) 3.5 - 5.1 mEq/L   Chloride 105  96 - 112 mEq/L   CO2 27  19 - 32 mEq/L   Glucose, Bld 90  70 - 99 mg/dL   BUN 3 (*) 6 - 23 mg/dL   Creatinine, Ser 4.40  0.50 - 1.10 mg/dL   Calcium 9.3  8.4 - 10.2 mg/dL   GFR calc non Af Amer >90  >90 mL/min   GFR calc Af Amer >90  >90 mL/min  CBC     Status: Abnormal   Collection Time   08/02/12  6:15 AM      Component Value Range   WBC 10.5  4.0 - 10.5 K/uL   RBC 3.88  3.87 - 5.11 MIL/uL   Hemoglobin 8.8 (*) 12.0 - 15.0 g/dL   HCT 72.5 (*) 36.6 - 44.0 %   MCV 76.0 (*) 78.0 - 100.0 fL   MCH 22.7 (*) 26.0 - 34.0 pg   MCHC 29.8 (*) 30.0 - 36.0 g/dL   RDW 34.7 (*) 42.5 - 95.6 %   Platelets 431 (*) 150 - 400 K/uL  Imaging/Diagnostic Tests: CTA abd:  Microperforation of small bowel. Marked wall thickening of the terminal ileum noted; suspected Crohn's disease with transmural terminal ileal inflammation, marked surrounding inflammatory findings, and localized microperforation.  Assessment and Plan: Gina Gonzalez is a 31 y.o. year old with a 1 month history of abdominal pain presented to the ED for abdominal pain and fever, after her outpatient visit from Dr. Madilyn Fireman (GI) office. She was seen in the office, where she was noted to be febrile with abdominal pain. She was sent for CT of the abdomen. CT of abdomen revealed microperforation of the small bowel, probable Chron's Disease Diagnosis vs. Ulcerative colitis vs smoldering appendix.   1. Abdominal Pain: likely a result of microperforation related to new crohns diagnosis. A: Improving. Pain free. Afebrile. WBC trending down.  P: - methylprednisolone 20 mg IV daily-will transition to equivalent dose of prednisone 30-20 mg at time of discharge.  - ABX: Ciprofloxacin IV and Flagyl IV d/c'd and surgery transitioned to zosyn IV. Will transition back to Cipro and Flagyl to complete a 7 day course at time of discharge.  - For pain and nausea control:  Morphine IV and Zofran IV  prn.  - Surgery and GI is following and we appreciate their help. Plan per General surgery is to repeat the abdominal CT scan on 08/03/12. The general surgery team recommends that the patient waits in the hospital until the repeat CT scan is obtained. If worse on CT, will possibly need ileocecectomy.  2. Anemia: likely related to crohns, will continue to monitor - anemia panel provides mixed picture-iron 14, TIBC 197, Ferritin 51, retic count 1.8, folate 14.1, B12 242 - FOBT negative - Hb 8.8 from 9.7 - will continue iron replacement  3. Hypokalemia: repleted with KCl IV 10 mEq x3 and dose of 40 mEq PO. Now 3.3. Ordered an additional 40 mEq PO.   4. FEN/GI:  F: decreased IV to 75 ml/hr E: hypokalemic as per above.  N: full liquid diet.   Code status: Full code   PPX: Lovenox, protonix IV   Dispo: Pending continued normal vitals and clinical exam and repeat abdominal CT scan.  Marikay Alar, MD 08/02/2012, 6:45 AM

## 2012-08-02 NOTE — Progress Notes (Signed)
  Subjective: She feels better. Almost no pain. Passing flatus and having loose stools. Tolerating full liquids. No fever or chills.  She has been on steroids, but intermittently for the past 30 days. Her Solu-Medrol was down to 20 mg a day, and because of her infection I think this should be rapidly tapered.  WBC down from 13.3-10.5. Potassium 3.3.  Objective: Vital signs in last 24 hours: Temp:  [98.4 F (36.9 C)-98.6 F (37 C)] 98.5 F (36.9 C) (09/21 8295) Pulse Rate:  [70-76] 70  (09/21 0632) Resp:  [18] 18  (09/21 6213) BP: (110-114)/(46-62) 111/62 mmHg (09/21 0632) SpO2:  [97 %-99 %] 97 % (09/21 0632) Last BM Date: 08/01/12  Intake/Output from previous day: 09/20 0701 - 09/21 0700 In: 2381.8 [P.O.:480; I.V.:1701.8; IV Piggyback:200] Out: -  Intake/Output this shift:       General appearance: alert. Ambulating. Pleasant. In no distress. Spirits good. GI: abdomen is somewhat obese but soft, nondistended, nontender, no mass, no guarding.  Lab R esults:   Basename 08/02/12 0615 08/01/12 0929  WBC 10.5 13.3*  HGB 8.8* 9.7*  HCT 29.5* 32.1*  PLT 431* 480*   BMET  Basename 08/02/12 0615 08/01/12 0600  NA 142 140  K 3.3* 3.5  CL 105 105  CO2 27 24  GLUCOSE 90 96  BUN 3* 5*  CREATININE 0.77 0.80  CALCIUM 9.3 9.1   PT/INR No results found for this basename: LABPROT:2,INR:2 in the last 72 hours ABG No results found for this basename: PHART:2,PCO2:2,PO2:2,HCO3:2 in the last 72 hours  Studies/Results: No results found.  Anti-infectives: Anti-infectives     Start     Dose/Rate Route Frequency Ordered Stop   07/30/12 0900   ciprofloxacin (CIPRO) IVPB 400 mg  Status:  Discontinued        400 mg 200 mL/hr over 60 Minutes Intravenous Every 12 hours 07/30/12 0011 07/30/12 0726   07/30/12 0730  piperacillin-tazobactam (ZOSYN) IVPB 3.375 g       3.375 g 12.5 mL/hr over 240 Minutes Intravenous 3 times per day 07/30/12 0726     07/30/12 0600   metroNIDAZOLE  (FLAGYL) IVPB 500 mg  Status:  Discontinued        500 mg 100 mL/hr over 60 Minutes Intravenous Every 8 hours 07/30/12 0011 07/30/12 0726   07/29/12 2045   ciprofloxacin (CIPRO) IVPB 400 mg        400 mg 200 mL/hr over 60 Minutes Intravenous  Once 07/29/12 2042 07/29/12 2219   07/29/12 2045   metroNIDAZOLE (FLAGYL) IVPB 500 mg        500 mg 100 mL/hr over 60 Minutes Intravenous  Once 07/29/12 2042 07/29/12 2222          Assessment/Plan:  Presumed Crohn's disease complicated by microperforation with infectious phlegmon right lower quadrant. Appendicitis, Meckel's diverticulum, cecal diverticulitis felt to be much less likely, and  no evidence of bowel obstruction.  Clinically she appears to be responding to antibiotics  Will rapidly taper steroids  Continue full liquids  No indication for CT at this time. No acute need for surgical intervention.   LOS: 4 days    Keiry Kowal M. Derrell Lolling, M.D., [facility] General and Minimally invasive Surgery Breast and Colorectal Surgery Office:   [phone] Pager:   [phone]  08/02/2012

## 2012-08-02 NOTE — Progress Notes (Signed)
Chirstine Lumadue 4:10 PM  Subjective: Patient is feeling much better and her computer chart was reviewed and she is tolerating full liquids and we discussed her clinical course as well as family history  Objective: Vital signs stable afebrile no acute distress abdomen is soft nontender labs stable white count normal  Assessment: Probable Crohn's disease  Plan: We discussed how she'll need close outpatient GI followup and we discussed her diet and probably we can get her some samples of apriso from the office and I would continue antibiotics then slowly wean steroids and either Dr. Madilyn Fireman or Evette Cristal  are happy to see back when necessary  [facility]

## 2012-08-03 LAB — CBC
HCT: 28.1 % — ABNORMAL LOW (ref 36.0–46.0)
Hemoglobin: 8.4 g/dL — ABNORMAL LOW (ref 12.0–15.0)
MCH: 23 pg — ABNORMAL LOW (ref 26.0–34.0)
MCHC: 29.9 g/dL — ABNORMAL LOW (ref 30.0–36.0)

## 2012-08-03 LAB — BASIC METABOLIC PANEL
BUN: 3 mg/dL — ABNORMAL LOW (ref 6–23)
Chloride: 101 mEq/L (ref 96–112)
GFR calc non Af Amer: >90 mL/min (ref >90)
Glucose, Bld: 88 mg/dL (ref 70–99)
Potassium: 3.7 mEq/L (ref 3.5–5.1)

## 2012-08-03 MED ORDER — FLUCONAZOLE 150 MG PO TABS
150.0000 mg | ORAL_TABLET | Freq: Once | ORAL | Status: AC
  Administered 2012-08-03: 150 mg via ORAL
  Filled 2012-08-03: qty 1

## 2012-08-03 NOTE — Progress Notes (Signed)
  Subjective: Subjectively doing well. Denies pain, nausea, vomiting. She states she is hungry and would like more to eat. She had a loose bowel movement this morning and is passing flatus.  WBC 9800, hemoglobin 8.4 potassium 3.7.  Objective: Vital signs in last 24 hours: Temp:  [97.8 F (36.6 C)-98.6 F (37 C)] 98.6 F (37 C) (09/22 0558) Pulse Rate:  [54-66] 66  (09/22 0558) Resp:  [18] 18  (09/22 0558) BP: (107-127)/(55-66) 107/57 mmHg (09/22 0558) SpO2:  [98 %-100 %] 100 % (09/22 0558) Last BM Date: 08/02/12  Intake/Output from previous day: 09/21 0701 - 09/22 0700 In: 792 [P.O.:240; I.V.:552] Out: -  Intake/Output this shift:    General appearance:  Alert. No distress. Ambulating. Positive. Mental status normal.  GI: abdomen soft, nontender, no mass, no guarding  Lab Results:   Basename 08/03/12 0510 08/02/12 0615  WBC 9.8 10.5  HGB 8.4* 8.8*  HCT 28.1* 29.5*  PLT 392 431*   BMET  Basename 08/03/12 0510 08/02/12 0615  NA 137 142  K 3.7 3.3*  CL 101 105  CO2 26 27  GLUCOSE 88 90  BUN 3* 3*  CREATININE 0.84 0.77  CALCIUM 9.3 9.3   PT/INR No results found for this basename: LABPROT:2,INR:2 in the last 72 hours ABG No results found for this basename: PHART:2,PCO2:2,PO2:2,HCO3:2 in the last 72 hours  Studies/Results: No results found.  Anti-infectives: Anti-infectives     Start     Dose/Rate Route Frequency Ordered Stop   07/30/12 0900   ciprofloxacin (CIPRO) IVPB 400 mg  Status:  Discontinued        400 mg 200 mL/hr over 60 Minutes Intravenous Every 12 hours 07/30/12 0011 07/30/12 0726   07/30/12 0730  piperacillin-tazobactam (ZOSYN) IVPB 3.375 g       3.375 g 12.5 mL/hr over 240 Minutes Intravenous 3 times per day 07/30/12 0726     07/30/12 0600   metroNIDAZOLE (FLAGYL) IVPB 500 mg  Status:  Discontinued        500 mg 100 mL/hr over 60 Minutes Intravenous Every 8 hours 07/30/12 0011 07/30/12 0726   07/29/12 2045   ciprofloxacin (CIPRO) IVPB  400 mg        400 mg 200 mL/hr over 60 Minutes Intravenous  Once 07/29/12 2042 07/29/12 2219   07/29/12 2045   metroNIDAZOLE (FLAGYL) IVPB 500 mg        500 mg 100 mL/hr over 60 Minutes Intravenous  Once 07/29/12 2042 07/29/12 2222          Assessment/Plan:  Presumed Crohn's disease complicated by microperforation with infectious phlegmon right lower quadrant. Appendicitis, Meckel's diverticulum, cecal diverticulitis felt to be much less likely, and no evidence of bowel obstruction.   Clinically she appears to be responding to antibiotics   Discontinue  steroids today  Advanced to low residue diet.  Repeat CT scan tomorrow. No acute need for surgical intervention.     LOS: 5 days    Taria Castrillo M. Derrell Lolling, M.D., [facility] General and Minimally invasive Surgery Breast and Colorectal Surgery Office:   [phone] Pager:   [phone]  08/03/2012

## 2012-08-03 NOTE — Progress Notes (Signed)
FMTS Attending Note Patient seen and examined by me, discussed with resident Dr Birdie Sons and I agree with his assessment/plan. Patient is advancing diet and tolerating well.  For repeat CT tomorrow; to address plan for antibiotics and for Crohns regimen with surgery and GI, respectively, before discharge.  Paula Compton, MD

## 2012-08-03 NOTE — Progress Notes (Signed)
Patient ID: Gina Gonzalez, female   DOB: 06/13/1981, 31 y.o.   MRN: [id number] Family Medicine Teaching Service Daily Progress Note Service Page: [phone]  Patient Assessment: 31 yo female with probable crohns disease found to have microperforations on CT abdomen admitted for bowel rest and antibiotic therapy.   Subjective: No complaints this AM. Denies abdominal pain. Tolerating full liquid diet.   Objective: Temp:  [97.8 F (36.6 C)-98.6 F (37 C)] 98.6 F (37 C) (09/22 0558) Pulse Rate:  [54-66] 66  (09/22 0558) Resp:  [18] 18  (09/22 0558) BP: (107-127)/(55-66) 107/57 mmHg (09/22 0558) SpO2:  [98 %-100 %] 100 % (09/22 0558) Exam: General: NAD, resting comfortably in bed.  Cardiovascular: rrr, no murmurs, rubs, or gallops Respiratory: CTAB, no wheezes or crackles Abdomen: soft, NT, ND, +BS, no masses Extremities: no edema  I have reviewed the patient's medications, labs, imaging, and diagnostic testing.  Notable results are summarized below.  CBC BMET   Lab 08/03/12 0510 08/02/12 0615 08/01/12 0929  WBC 9.8 10.5 13.3*  HGB 8.4* 8.8* 9.7*  HCT 28.1* 29.5* 32.1*  PLT 392 431* 480*    Lab 08/03/12 0510 08/02/12 0615 08/01/12 0600  NA 137 142 140  K 3.7 3.3* 3.5  CL 101 105 105  CO2 26 27 24   BUN 3* 3* 5*  CREATININE 0.84 0.77 0.80  GLUCOSE 88 90 96  CALCIUM 9.3 9.3 9.1     Results for orders placed during the hospital encounter of 07/29/12 (from the past 24 hour(s))  BASIC METABOLIC PANEL     Status: Abnormal   Collection Time   08/03/12  5:10 AM      Component Value Range   Sodium 137  135 - 145 mEq/L   Potassium 3.7  3.5 - 5.1 mEq/L   Chloride 101  96 - 112 mEq/L   CO2 26  19 - 32 mEq/L   Glucose, Bld 88  70 - 99 mg/dL   BUN 3 (*) 6 - 23 mg/dL   Creatinine, Ser 1.47  0.50 - 1.10 mg/dL   Calcium 9.3  8.4 - 82.9 mg/dL   GFR calc non Af Amer >90  >90 mL/min   GFR calc Af Amer >90  >90 mL/min  CBC     Status: Abnormal   Collection Time   08/03/12  5:10 AM     Component Value Range   WBC 9.8  4.0 - 10.5 K/uL   RBC 3.65 (*) 3.87 - 5.11 MIL/uL   Hemoglobin 8.4 (*) 12.0 - 15.0 g/dL   HCT 56.2 (*) 13.0 - 86.5 %   MCV 77.0 (*) 78.0 - 100.0 fL   MCH 23.0 (*) 26.0 - 34.0 pg   MCHC 29.9 (*) 30.0 - 36.0 g/dL   RDW 78.4 (*) 69.6 - 29.5 %   Platelets 392  150 - 400 K/uL    Imaging/Diagnostic Tests: CTA abd:  Microperforation of small bowel. Marked wall thickening of the terminal ileum noted; suspected Crohn's disease with transmural terminal ileal inflammation, marked surrounding inflammatory findings, and localized microperforation.  Assessment and Plan: Jonte Yanes is a 31 y.o. year old with a 1 month history of abdominal pain presented to the ED for abdominal pain and fever, after her outpatient visit from Dr. Madilyn Fireman (GI) office. She was seen in the office, where she was noted to be febrile with abdominal pain. She was sent for CT of the abdomen. CT of abdomen revealed microperforation of the small bowel, probable Chron's Disease Diagnosis  vs. Ulcerative colitis vs smoldering appendix.   1. Abdominal Pain: likely a result of microperforation related to new crohns diagnosis. A: Improving. Pain free. Afebrile. WBC trending down.  P: - methylprednisolone 10 mg IV daily-discontinued by surgery today  - ABX: Ciprofloxacin IV and Flagyl IV d/c'd and surgery transitioned to zosyn IV.  - For pain and nausea control:  Morphine IV and Zofran IV prn.  - Surgery and GI is following and we appreciate their help. General surgery recs repeat CT abd tomorrow. Rec continuing antibiotics, low residue diet, and discontinuing steroids. Will need to know if planning on transitioning to PO antibiotics or stopping antibiotics all together after CT.  2. Anemia: likely related to crohns, will continue to monitor - anemia panel provides mixed picture-iron 14, TIBC 197, Ferritin 51, retic count 1.8, folate 14.1, B12 242 - FOBT negative - Hb 8.4 from 8.8 - will continue  iron replacement  3. Hypokalemia: repleted with KCl IV 10 mEq x3 and dose of 40 mEq PO x2. Now 3.7.   4. FEN/GI:  F: decreased IV to 75 ml/hr E: hypokalemic as per above.  N: full liquid diet.   Code status: Full code   PPX: Lovenox, protonix IV   Dispo: Pending continued normal vitals and clinical exam.  Marikay Alar, MD 08/03/2012, 9:05 AM

## 2012-08-03 NOTE — Progress Notes (Signed)
Gina Gonzalez 10:47 AM  Subjective: The patient is asymptomatic and seems to be tolerating regular food and moving her bowels without pain although she did have a little indigestion which went away on its own  Objective: Vital signs stable afebrile no acute distress abdomen is soft nontender labs stable  Assessment: Probable Crohn's disease  Plan: Await repeat CT tomorrow and I discussed the case with the surgical team and probable discharge tomorrow on antibiotic and samples of apriso from our office and close GI followup as an outpatient including possibly a colonoscopy in one to 2 months to try to confirm the diagnosis  [facility]

## 2012-08-04 ENCOUNTER — Inpatient Hospital Stay (HOSPITAL_COMMUNITY): Payer: MEDICAID

## 2012-08-04 LAB — BASIC METABOLIC PANEL
BUN: 4 mg/dL — ABNORMAL LOW (ref 6–23)
Calcium: 9 mg/dL (ref 8.4–10.5)
Creatinine, Ser: 0.91 mg/dL (ref 0.50–1.10)
GFR calc non Af Amer: 84 mL/min — ABNORMAL LOW (ref >90)
Glucose, Bld: 91 mg/dL (ref 70–99)

## 2012-08-04 LAB — CBC
MCH: 23.8 pg — ABNORMAL LOW (ref 26.0–34.0)
MCHC: 31 g/dL (ref 30.0–36.0)
Platelets: 379 10*3/uL (ref 150–400)

## 2012-08-04 MED ORDER — IOHEXOL 300 MG/ML  SOLN
20.0000 mL | INTRAMUSCULAR | Status: AC
  Administered 2012-08-04 (×2): 20 mL via ORAL

## 2012-08-04 MED ORDER — IOHEXOL 300 MG/ML  SOLN
100.0000 mL | Freq: Once | INTRAMUSCULAR | Status: AC | PRN
  Administered 2012-08-04: 100 mL via INTRAVENOUS

## 2012-08-04 MED ORDER — POTASSIUM CHLORIDE CRYS ER 20 MEQ PO TBCR
40.0000 meq | EXTENDED_RELEASE_TABLET | Freq: Two times a day (BID) | ORAL | Status: AC
  Administered 2012-08-04 (×2): 40 meq via ORAL
  Filled 2012-08-04 (×2): qty 2

## 2012-08-04 MED ORDER — HYDROCODONE-ACETAMINOPHEN 5-325 MG PO TABS
1.0000 | ORAL_TABLET | ORAL | Status: DC | PRN
  Administered 2012-08-04 – 2012-08-05 (×3): 1 via ORAL
  Filled 2012-08-04 (×3): qty 1

## 2012-08-04 NOTE — Progress Notes (Signed)
Patient ID: Gina Gonzalez, female   DOB: Jul 16, 1981, 31 y.o.   MRN: [id number] Family Medicine Teaching Service Daily Progress Note Service Page: [phone]  Patient Assessment: 31 yo female with probable crohns disease found to have microperforations on CT abdomen admitted for bowel rest and antibiotic therapy.   Subjective: Patient complains of some increased abdominal discomfort last night.  States this occurred following eating a meal of bacon and eggs and then later salisbury steak with gravy and mac and cheese.  Required 2 doses of morphine for this pain.   Objective: Temp:  [98.8 F (37.1 C)-99.4 F (37.4 C)] 99.4 F (37.4 C) (09/23 0602) Pulse Rate:  [66-88] 88  (09/23 0602) Resp:  [16-18] 18  (09/23 0602) BP: (109-120)/(52-59) 120/52 mmHg (09/23 0602) SpO2:  [95 %-100 %] 95 % (09/23 0602) Exam: General: NAD, resting comfortably in bed.  Cardiovascular: rrr, no murmurs, rubs, or gallops Respiratory: CTAB, no wheezes or crackles Abdomen: soft, mildly tender in RLQ, ND, +BS, no masses Extremities: no edema  I have reviewed the patient's medications, labs, imaging, and diagnostic testing.  Notable results are summarized below.  CBC BMET   Lab 08/04/12 0700 08/03/12 0510 08/02/12 0615  WBC 13.5* 9.8 10.5  HGB 9.3* 8.4* 8.8*  HCT 30.0* 28.1* 29.5*  PLT 379 392 431*    Lab 08/04/12 0700 08/03/12 0510 08/02/12 0615  NA 136 137 142  K 3.3* 3.7 3.3*  CL 101 101 105  CO2 24 26 27   BUN 4* 3* 3*  CREATININE 0.91 0.84 0.77  GLUCOSE 91 88 90  CALCIUM 9.0 9.3 9.3     Results for orders placed during the hospital encounter of 07/29/12 (from the past 24 hour(s))  BASIC METABOLIC PANEL     Status: Abnormal   Collection Time   08/04/12  7:00 AM      Component Value Range   Sodium 136  135 - 145 mEq/L   Potassium 3.3 (*) 3.5 - 5.1 mEq/L   Chloride 101  96 - 112 mEq/L   CO2 24  19 - 32 mEq/L   Glucose, Bld 91  70 - 99 mg/dL   BUN 4 (*) 6 - 23 mg/dL   Creatinine, Ser 1.32  0.50  - 1.10 mg/dL   Calcium 9.0  8.4 - 44.0 mg/dL   GFR calc non Af Amer 84 (*) >90 mL/min   GFR calc Af Amer >90  >90 mL/min  CBC     Status: Abnormal   Collection Time   08/04/12  7:00 AM      Component Value Range   WBC 13.5 (*) 4.0 - 10.5 K/uL   RBC 3.91  3.87 - 5.11 MIL/uL   Hemoglobin 9.3 (*) 12.0 - 15.0 g/dL   HCT 10.2 (*) 72.5 - 36.6 %   MCV 76.7 (*) 78.0 - 100.0 fL   MCH 23.8 (*) 26.0 - 34.0 pg   MCHC 31.0  30.0 - 36.0 g/dL   RDW 44.0 (*) 34.7 - 42.5 %   Platelets 379  150 - 400 K/uL    Imaging/Diagnostic Tests: CTA abd:  Microperforation of small bowel. Marked wall thickening of the terminal ileum noted; suspected Crohn's disease with transmural terminal ileal inflammation, marked surrounding inflammatory findings, and localized microperforation.  Assessment and Plan: Gina Gonzalez is a 31 y.o. year old with a 1 month history of abdominal pain presented to the ED for abdominal pain and fever, after her outpatient visit from Dr. Madilyn Fireman (GI) office. She was  seen in the office, where she was noted to be febrile with abdominal pain. She was sent for CT of the abdomen. CT of abdomen revealed microperforation of the small bowel, probable Chron's Disease Diagnosis vs. Ulcerative colitis vs smoldering appendix.   1. Abdominal Pain: likely a result of microperforation related to new crohns diagnosis. A: Improving. Pain free. Afebrile. WBC trending down.  P: - methylprednisolone-discontinued by surgery  - ABX: Ciprofloxacin IV and Flagyl IV d/c'd and surgery transitioned to zosyn IV.  - For pain and nausea control:  Morphine IV and Zofran IV prn. Added vicodin 5/325 prn. - Surgery and GI is following and we appreciate their help. General surgery recs repeat CT abd today. Rec continuing antibiotics, low residue diet, and discontinuing steroids. Patient will be sent out on antibiotics. - WBC increased overnight, will continue to follow this - will await CT read this morning and surgery recs  regarding dispo  2. Anemia: likely related to crohns, will continue to monitor - anemia panel provides mixed picture-iron 14, TIBC 197, Ferritin 51, retic count 1.8, folate 14.1, B12 242 - FOBT negative - Hb 9.3 from 8.4 - will continue iron replacement  3. Hypokalemia: repleted with KCl IV 10 mEq x3 and dose of 40 mEq PO x2. Now 3.3. Will replete with 40 mEq KCl.  4. FEN/GI:  F: decreased IV to 75 ml/hr E: hypokalemic as per above.  N: low fiber diet.   Code status: Full code   PPX: Lovenox, protonix IV   Dispo: Pending continued normal vitals and clinical exam and CT abdomen.  Marikay Alar, MD 08/04/2012, 8:49 AM

## 2012-08-04 NOTE — Progress Notes (Signed)
I discussed with Dr Hiram Gash.  I agree with their plans documented in their progress note for today.

## 2012-08-04 NOTE — Progress Notes (Signed)
Patient ID: Gina Gonzalez, female   DOB: [birth year], 31 y.o.   MRN: [id number]    Subjective: Pt c/o some increase in pain today after starting on a regular diet yesterday.  Some nausea overnight.  Objective: Vital signs in last 24 hours: Temp:  [98.8 F (37.1 C)-99.4 F (37.4 C)] 99.4 F (37.4 C) (09/23 0602) Pulse Rate:  [66-88] 88  (09/23 0602) Resp:  [16-18] 18  (09/23 0602) BP: (109-120)/(52-59) 120/52 mmHg (09/23 0602) SpO2:  [95 %-100 %] 95 % (09/23 0602) Last BM Date: 08/03/12  Intake/Output from previous day: 09/22 0701 - 09/23 0700 In: 2662.8 [P.O.:840; I.V.:1822.8] Out: 200 [Emesis/NG output:200] Intake/Output this shift:    PE: Abd: soft, NT, ND, +BS  Lab Results:   Basename 08/04/12 0700 08/03/12 0510  WBC 13.5* 9.8  HGB 9.3* 8.4*  HCT 30.0* 28.1*  PLT 379 392   BMET  Basename 08/04/12 0700 08/03/12 0510  NA 136 137  K 3.3* 3.7  CL 101 101  CO2 24 26  GLUCOSE 91 88  BUN 4* 3*  CREATININE 0.91 0.84  CALCIUM 9.0 9.3   PT/INR No results found for this basename: LABPROT:2,INR:2 in the last 72 hours CMP     Component Value Date/Time   NA 136 08/04/2012 0700   K 3.3* 08/04/2012 0700   CL 101 08/04/2012 0700   CO2 24 08/04/2012 0700   GLUCOSE 91 08/04/2012 0700   BUN 4* 08/04/2012 0700   CREATININE 0.91 08/04/2012 0700   CALCIUM 9.0 08/04/2012 0700   PROT 8.0 06/28/2012 1538   ALBUMIN 3.2* 06/28/2012 1538   AST 16 06/28/2012 1538   ALT 8 06/28/2012 1538   ALKPHOS 60 06/28/2012 1538   BILITOT 0.1* 06/28/2012 1538   GFRNONAA 84* 08/04/2012 0700   GFRAA >90 08/04/2012 0700   Lipase     Component Value Date/Time   LIPASE 26 06/28/2012 1538       Studies/Results: No results found.  Anti-infectives: Anti-infectives     Start     Dose/Rate Route Frequency Ordered Stop   08/03/12 2215   fluconazole (DIFLUCAN) tablet 150 mg        150 mg Oral  Once 08/03/12 2211 08/03/12 2231   07/30/12 0900   ciprofloxacin (CIPRO) IVPB 400 mg  Status:  Discontinued         400 mg 200 mL/hr over 60 Minutes Intravenous Every 12 hours 07/30/12 0011 07/30/12 0726   07/30/12 0730  piperacillin-tazobactam (ZOSYN) IVPB 3.375 g       3.375 g 12.5 mL/hr over 240 Minutes Intravenous 3 times per day 07/30/12 0726     07/30/12 0600   metroNIDAZOLE (FLAGYL) IVPB 500 mg  Status:  Discontinued        500 mg 100 mL/hr over 60 Minutes Intravenous Every 8 hours 07/30/12 0011 07/30/12 0726   07/29/12 2045   ciprofloxacin (CIPRO) IVPB 400 mg        400 mg 200 mL/hr over 60 Minutes Intravenous  Once 07/29/12 2042 07/29/12 2219   07/29/12 2045   metroNIDAZOLE (FLAGYL) IVPB 500 mg        500 mg 100 mL/hr over 60 Minutes Intravenous  Once 07/29/12 2042 07/29/12 2222           Assessment/Plan  1. SB microperforation 2. Presumed Crohn's 3. Increasing leukocytosis  Plan: 1. Will await CT scan today to re-evaluate this area and make sure that it is continuing to improve.   2. Follow WBC as it  has trended back up today and patient did complain of increasing pain yesterday with solid diet.    LOS: 6 days    Lelynd Poer E 08/04/2012, 8:43 AM Pager: [phone]

## 2012-08-05 LAB — CBC
MCH: 23.2 pg — ABNORMAL LOW (ref 26.0–34.0)
Platelets: 403 10*3/uL — ABNORMAL HIGH (ref 150–400)
RBC: 3.96 MIL/uL (ref 3.87–5.11)
WBC: 13.7 10*3/uL — ABNORMAL HIGH (ref 4.0–10.5)

## 2012-08-05 LAB — BASIC METABOLIC PANEL
Calcium: 9.5 mg/dL (ref 8.4–10.5)
GFR calc non Af Amer: >90 mL/min (ref >90)
Sodium: 138 mEq/L (ref 135–145)

## 2012-08-05 MED ORDER — METRONIDAZOLE 500 MG PO TABS: 500.0000 mg | ORAL_TABLET | Freq: Three times a day (TID) | ORAL | Status: DC

## 2012-08-05 MED ORDER — CIPROFLOXACIN HCL 500 MG PO TABS: 500.0000 mg | ORAL_TABLET | Freq: Two times a day (BID) | ORAL | Status: AC

## 2012-08-05 MED ORDER — AMOXICILLIN-POT CLAVULANATE 875-125 MG PO TABS
1.0000 | ORAL_TABLET | Freq: Two times a day (BID) | ORAL | Status: DC
  Administered 2012-08-05: 1 via ORAL
  Filled 2012-08-05 (×2): qty 1

## 2012-08-05 MED ORDER — HYDROCODONE-ACETAMINOPHEN 5-325 MG PO TABS
1.0000 | ORAL_TABLET | ORAL | Status: DC | PRN
Start: 2012-08-05 — End: 2013-07-30

## 2012-08-05 NOTE — Discharge Summary (Signed)
Physician Discharge Summary  Patient ID: Gina Gonzalez MRN: [id number] DOB: [birth year] Age: 32 y.o.  Admit date: 07/29/2012 Discharge date: 08/05/2012 Admitting Physician: Tobey Grim, MD  PCP: Pcp Not In System  Consultants:GI, Dr. Madilyn Fireman Gen Surg, Dr. Abbey Chatters     Discharge Diagnosis: Perforated small intestine Principal Problem:  *Perforated small intestine Active Problems:  Abdominal pain, acute, bilateral lower quadrant  Fever    Hospital Course Gina Gonzalez is a 31 y.o. year old with a 1 month history of abdominal pain presented to the ED for abdominal pain and fever, after her outpatient visit from Dr. Madilyn Fireman (GI) office. She was seen in the office, where she was noted to be febrile with abdominal pain. She was sent for CT of the abdomen. CT of abdomen revealed microperforation of the small bowel, probable Chron's Disease Diagnosis vs. Ulcerative colitis vs smoldering appendix.   1. Abdominal Pain: likely a result of microperforation related to new crohns diagnosis.  Patient initially managed with IV methylprednisolone, cipro/flagyl, and morphine for pain control.  Surgery was consulted and recommended switching antibiotics to zosyn.  The patients WBC continued to climb on the steroids and these were gradually tapered off prior to discharge.  Patient initially had improvement in abdominal pain, but as her diet was advanced she began to have more abdominal pain with eating bacon, eggs, mac and cheese, and salisbury steak. Prior to discharge her WBC has trended back up, but is stable at 13.7. Patient was transitioned to vicodin 5/500 prn for pain prior to discharge. Repeat CT showed no change in inflammatory process in RLQ. Surgery and GI both state patient is ok to d/c with good PO pain control, surgery recs 10-14 day course of cipro 500 BID and flagyl 500 TID   2. Anemia: likely related to crohns. Anemia panel provides mixed picture-iron 14, TIBC 197, Ferritin 51, retic  count 1.8, folate 14.1, B12 242. FOBT negative. Hb stable at 9.2. Continued iron replacement.  3. Hypokalemia: Patient with potassium of 3.3 on admission.  Was repleted several times. 3.7 on day of discharge.   Problem List 1. Abdominal pain 2. Microperforation of small bowel 3. Anemia 4. Hypokalemia         Discharge PE   Filed Vitals:   08/05/12 0521  BP: 119/60  Pulse: 90  Temp: 99.1 F (37.3 C)  Resp: 18   General: NAD, resting comfortably in bed.  Cardiovascular: rrr, no murmurs, rubs, or gallops  Respiratory: CTAB, no wheezes or crackles  Abdomen: soft, mildly tender in RLQ, ND, +BS, no masses  Extremities: no edema   Procedures/Imaging:  Ct Abdomen Pelvis W Contrast  08/04/2012   IMPRESSION: Findings remain most in keeping with inflammatory bowel disease with contained microperforation, though again appendiceal pathology not excluded.  There is evidence of delayed transit through the inflamed ileal segments though no evidence for complete obstruction.     Ct Abdomen Pelvis W Contrast  07/29/2012  IMPRESSION:  1.  Prominent infiltrative process in the right lower quadrant is again noted, now with locules of extraluminal gas internally indicating microperforation.  Marked wall thickening of the terminal ileum noted.  The appendix is completely obscured.  Given the overall appearance, I suspect that this represents Crohn's disease with transmural terminal ileal inflammation, marked surrounding inflammatory findings, and localized microperforation. The appendix remains completely obscured, and accordingly a smoldering appendicitis is not readily excluded although the terminal ileum appears to be the epicenter of the process.  There is also a  small amount of potentially complex free pelvic fluid in the cul-de-sac.  Orally administered contrast does extend through to the colon. 2.  3.9 cm left ovarian cyst. 3.  The presence of bilateral symmetric sacroiliitis is commonly associated  with inflammatory bowel disease/Crohn's disease. 4.  Stable likely incidental hypodense lesion in the anterior spleen.       Labs  CBC  Lab 08/05/12 0540 08/04/12 0700 08/03/12 0510  WBC 13.7* 13.5* 9.8  HGB 9.2* 9.3* 8.4*  HCT 30.4* 30.0* 28.1*  PLT 403* 379 392   BMET  Lab 08/05/12 0540 08/04/12 0700 08/03/12 0510  NA 138 136 137  K 3.7 3.3* 3.7  CL 101 101 101  CO2 26 24 26   BUN 3* 4* 3*  CREATININE 0.81 0.91 0.84  CALCIUM 9.5 9.0 9.3  PROT -- -- --  BILITOT -- -- --  ALKPHOS -- -- --  ALT -- -- --  AST -- -- --  GLUCOSE 100* 91 88   Results for orders placed during the hospital encounter of 07/29/12 (from the past 72 hour(s))  BASIC METABOLIC PANEL     Status: Abnormal   Collection Time   08/03/12  5:10 AM      Component Value Range Comment   Sodium 137  135 - 145 mEq/L    Potassium 3.7  3.5 - 5.1 mEq/L    Chloride 101  96 - 112 mEq/L    CO2 26  19 - 32 mEq/L    Glucose, Bld 88  70 - 99 mg/dL    BUN 3 (*) 6 - 23 mg/dL    Creatinine, Ser 1.61  0.50 - 1.10 mg/dL    Calcium 9.3  8.4 - 09.6 mg/dL    GFR calc non Af Amer >90  >90 mL/min    GFR calc Af Amer >90  >90 mL/min   CBC     Status: Abnormal   Collection Time   08/03/12  5:10 AM      Component Value Range Comment   WBC 9.8  4.0 - 10.5 K/uL    RBC 3.65 (*) 3.87 - 5.11 MIL/uL    Hemoglobin 8.4 (*) 12.0 - 15.0 g/dL    HCT 04.5 (*) 40.9 - 46.0 %    MCV 77.0 (*) 78.0 - 100.0 fL    MCH 23.0 (*) 26.0 - 34.0 pg    MCHC 29.9 (*) 30.0 - 36.0 g/dL    RDW 81.1 (*) 91.4 - 15.5 %    Platelets 392  150 - 400 K/uL   BASIC METABOLIC PANEL     Status: Abnormal   Collection Time   08/04/12  7:00 AM      Component Value Range Comment   Sodium 136  135 - 145 mEq/L    Potassium 3.3 (*) 3.5 - 5.1 mEq/L    Chloride 101  96 - 112 mEq/L    CO2 24  19 - 32 mEq/L    Glucose, Bld 91  70 - 99 mg/dL    BUN 4 (*) 6 - 23 mg/dL    Creatinine, Ser 7.82  0.50 - 1.10 mg/dL    Calcium 9.0  8.4 - 95.6 mg/dL    GFR calc non Af Amer 84  (*) >90 mL/min    GFR calc Af Amer >90  >90 mL/min   CBC     Status: Abnormal   Collection Time   08/04/12  7:00 AM      Component Value Range Comment  WBC 13.5 (*) 4.0 - 10.5 K/uL    RBC 3.91  3.87 - 5.11 MIL/uL    Hemoglobin 9.3 (*) 12.0 - 15.0 g/dL    HCT 45.4 (*) 09.8 - 46.0 %    MCV 76.7 (*) 78.0 - 100.0 fL    MCH 23.8 (*) 26.0 - 34.0 pg    MCHC 31.0  30.0 - 36.0 g/dL    RDW 11.9 (*) 14.7 - 15.5 %    Platelets 379  150 - 400 K/uL   BASIC METABOLIC PANEL     Status: Abnormal   Collection Time   08/05/12  5:40 AM      Component Value Range Comment   Sodium 138  135 - 145 mEq/L    Potassium 3.7  3.5 - 5.1 mEq/L    Chloride 101  96 - 112 mEq/L    CO2 26  19 - 32 mEq/L    Glucose, Bld 100 (*) 70 - 99 mg/dL    BUN 3 (*) 6 - 23 mg/dL    Creatinine, Ser 8.29  0.50 - 1.10 mg/dL    Calcium 9.5  8.4 - 56.2 mg/dL    GFR calc non Af Amer >90  >90 mL/min    GFR calc Af Amer >90  >90 mL/min   CBC     Status: Abnormal   Collection Time   08/05/12  5:40 AM      Component Value Range Comment   WBC 13.7 (*) 4.0 - 10.5 K/uL    RBC 3.96  3.87 - 5.11 MIL/uL    Hemoglobin 9.2 (*) 12.0 - 15.0 g/dL    HCT 13.0 (*) 86.5 - 46.0 %    MCV 76.8 (*) 78.0 - 100.0 fL    MCH 23.2 (*) 26.0 - 34.0 pg    MCHC 30.3  30.0 - 36.0 g/dL    RDW 78.4 (*) 69.6 - 15.5 %    Platelets 403 (*) 150 - 400 K/uL        Patient condition at time of discharge/disposition: stable  Disposition-home   Follow up issues: 1. Anemia: possibly related to crohns disease, will likely need iron replacement therapy 2. Hypokalemia: potassium continued to drop and need repletion while in the hospital, may need out patient monitoring of this and supplement 3. Crohns: abdominal pain and diet advancement, patient was hesitant to advance diet given associated abdominal pain. Make sure pain is adequately controlled and that patient moves towards resuming a well balanced diet as outline in discharge instructions.  Discharge follow up:   Follow-up Information    Follow up with HAYES,JOHN C, MD. In 3 weeks.   Contact information:   [address] Jaynie Crumble [place] [address] [phone]       Follow up with ROSENBOWER,TODD J, MD. In 3 weeks.   Contact information:   [address] [place] [address] [phone]       Follow up with Kevin Fenton, MD. On 08/15/2012. (1:30 pm)    Contact information:   [address] [place] [address] [phone]          Discharge Instructions: Please refer to Patient Instructions section of EMR for full details.  Patient was counseled important signs and symptoms that should prompt return to medical care, changes in medications, dietary instructions, activity restrictions, and follow up appointments.  Significant instructions noted below:  Discharge Orders    Future Appointments: Provider: Department: Dept Phone:  Center:   08/15/2012 1:30 PM Elenora Gamma, MD [facility] [phone] [facility]       Discharge Medications   Medication List     As of 08/06/2012  4:32 PM    START taking these medications         ciprofloxacin 500 MG tablet   Commonly known as: CIPRO   Take 1 tablet (500 mg total) by mouth 2 (two) times daily.      HYDROcodone-acetaminophen 5-325 MG per tablet   Commonly known as: NORCO/VICODIN   Take 1 tablet by mouth every 4 (four) hours as needed.      metroNIDAZOLE 500 MG tablet   Commonly known as: FLAGYL   Take 1 tablet (500 mg total) by mouth 3 (three) times daily.      CONTINUE taking these medications         ANTI GAS PO      ibuprofen 200 MG tablet   Commonly known as: ADVIL,MOTRIN      promethazine 25 MG tablet   Commonly known as: PHENERGAN      STOP taking these medications         predniSONE 20 MG tablet   Commonly known as: DELTASONE          Where to get your medications    These are the prescriptions that you need to pick up. We  sent them to a specific pharmacy, so you will need to go there to get them.   [id number] - [place], [place] - [address] AT [facility] [address]    [address] [place] [address]    Phone: [phone]        ciprofloxacin 500 MG tablet   metroNIDAZOLE 500 MG tablet         You may get these medications from any pharmacy.         HYDROcodone-acetaminophen 5-325 MG per tablet           Marikay Alar, MD of [facility] 08/06/2012 4:46 PM

## 2012-08-05 NOTE — Progress Notes (Signed)
Subjective: Some post-prandial right lower quadrant pain with the diet she's currently receiving. Abdominal pain better. Wants to go home.  Objective: Vital signs in last 24 hours: Temp:  [98.3 F (36.8 C)-99.1 F (37.3 C)] 99.1 F (37.3 C) (09/24 0521) Pulse Rate:  [85-90] 90  (09/24 0521) Resp:  [18] 18  (09/24 0521) BP: (117-122)/(56-73) 119/60 mmHg (09/24 0521) SpO2:  [95 %-99 %] 95 % (09/24 0521) Weight change:  Last BM Date: 08/04/12  PE: GEN:  Overweight, NAD ABD:  Soft, mild right lower quadrant tenderness, active bowel sounds.  Lab Results: CBC    Component Value Date/Time   WBC 13.7* 08/05/2012 0540   RBC 3.96 08/05/2012 0540   HGB 9.2* 08/05/2012 0540   HCT 30.4* 08/05/2012 0540   PLT 403* 08/05/2012 0540   MCV 76.8* 08/05/2012 0540   MCH 23.2* 08/05/2012 0540   MCHC 30.3 08/05/2012 0540   RDW 16.9* 08/05/2012 0540   LYMPHSABS 1.7 07/29/2012 1146   MONOABS 1.5* 07/29/2012 1146   EOSABS 0.1 07/29/2012 1146   BASOSABS 0.0 07/29/2012 1146   CT abd/pelvis:  No significant change (microperforation, and neighboring inflammatory changes and reactive-appearing adenopathy of right lower quadrant persists, but not worse)  Assessment:  1.  Presumed Crohn's ileocolitis with microperforation.  Improving. 2.  Abdominal pain, likely due to #1 above, improving.  Plan:  1.  Low residue diet. 2.  No steroids, given microperforation. 3.  Transition to oral analgesics as needed. 4.  Agree with 2 week course of outpatient antibiotics; would transition to po formulation. 5.  Doubt utility of mesalamine agent in making any appreciable difference in this case; would defer this for the time-being. 5.  Once patient's pain able to be controlled with oral medication, patient is OK from GI perspective to be discharged.  She will need follow-up with her main gastroenterologist, Dr. Madilyn Fireman, and will likely need a colonoscopy in the next couple months. 6.  Will sign-off; please call with  questions.   Gina Gonzalez 08/05/2012, 9:15 AM

## 2012-08-05 NOTE — Progress Notes (Signed)
I discussed with Dr Birdie Sons.  I agree with their plans documented in their progress note for today. Pt is stable for discharge home with cipro and flagyl orally for 10 days. Hydrocodone/APAP tablets as needed for pain.  Follow up schedule with General Surgery and with gastroenterology. Pt stable for discharge home.

## 2012-08-05 NOTE — Progress Notes (Signed)
Patient ID: Gina Gonzalez, female   DOB: [birth year], 31 y.o.   MRN: [id number] Family Medicine Teaching Service Daily Progress Note Service Page: [phone]  Patient Assessment: 31 yo female with probable crohns disease found to have microperforations on CT abdomen admitted for bowel rest and antibiotic therapy.   Subjective: patient continues to complain of RLQ abdominal pain.  States it is sharp pain when she gets up from bed.   States vicodin helps. She is watching her food intake, as her meals yesterday upset her stoamch.    Objective: Temp:  [98.3 F (36.8 C)-99.1 F (37.3 C)] 99.1 F (37.3 C) (09/24 0521) Pulse Rate:  [85-90] 90  (09/24 0521) Resp:  [18] 18  (09/24 0521) BP: (117-122)/(56-73) 119/60 mmHg (09/24 0521) SpO2:  [95 %-99 %] 95 % (09/24 0521) Exam: General: NAD, resting comfortably in bed.  Cardiovascular: rrr, no murmurs, rubs, or gallops Respiratory: CTAB, no wheezes or crackles Abdomen: soft, mildly tender in RLQ, ND, +BS, no masses Extremities: no edema  I have reviewed the patient's medications, labs, imaging, and diagnostic testing.  Notable results are summarized below.  CBC BMET   Lab 08/05/12 0540 08/04/12 0700 08/03/12 0510  WBC 13.7* 13.5* 9.8  HGB 9.2* 9.3* 8.4*  HCT 30.4* 30.0* 28.1*  PLT 403* 379 392    Lab 08/05/12 0540 08/04/12 0700 08/03/12 0510  NA 138 136 137  K 3.7 3.3* 3.7  CL 101 101 101  CO2 26 24 26   BUN 3* 4* 3*  CREATININE 0.81 0.91 0.84  GLUCOSE 100* 91 88  CALCIUM 9.5 9.0 9.3     Results for orders placed during the hospital encounter of 07/29/12 (from the past 24 hour(s))  BASIC METABOLIC PANEL     Status: Abnormal   Collection Time   08/05/12  5:40 AM      Component Value Range   Sodium 138  135 - 145 mEq/L   Potassium 3.7  3.5 - 5.1 mEq/L   Chloride 101  96 - 112 mEq/L   CO2 26  19 - 32 mEq/L   Glucose, Bld 100 (*) 70 - 99 mg/dL   BUN 3 (*) 6 - 23 mg/dL   Creatinine, Ser 1.30  0.50 - 1.10 mg/dL   Calcium 9.5  8.4 -  86.5 mg/dL   GFR calc non Af Amer >90  >90 mL/min   GFR calc Af Amer >90  >90 mL/min  CBC     Status: Abnormal   Collection Time   08/05/12  5:40 AM      Component Value Range   WBC 13.7 (*) 4.0 - 10.5 K/uL   RBC 3.96  3.87 - 5.11 MIL/uL   Hemoglobin 9.2 (*) 12.0 - 15.0 g/dL   HCT 78.4 (*) 69.6 - 29.5 %   MCV 76.8 (*) 78.0 - 100.0 fL   MCH 23.2 (*) 26.0 - 34.0 pg   MCHC 30.3  30.0 - 36.0 g/dL   RDW 28.4 (*) 13.2 - 44.0 %   Platelets 403 (*) 150 - 400 K/uL    Imaging/Diagnostic Tests: CTA abd:  Microperforation of small bowel. Marked wall thickening of the terminal ileum noted; suspected Crohn's disease with transmural terminal ileal inflammation, marked surrounding inflammatory findings, and localized microperforation.  Assessment and Plan: Sierria Gonzalez is a 31 y.o. year old with a 1 month history of abdominal pain presented to the ED for abdominal pain and fever, after her outpatient visit from Dr. Madilyn Fireman (GI) office. She was seen in  the office, where she was noted to be febrile with abdominal pain. She was sent for CT of the abdomen. CT of abdomen revealed microperforation of the small bowel, probable Chron's Disease Diagnosis vs. Ulcerative colitis vs smoldering appendix.   1. Abdominal Pain: likely a result of microperforation related to new crohns diagnosis. Continues to have pain in RLQ.  WBC has trended back up, but is stable at 13.7 - methylprednisolone-discontinued by surgery  - ABX: Ciprofloxacin IV and Flagyl IV d/c'd and surgery transitioned to zosyn IV.  - For pain and nausea control: Zofran IV prn. Added vicodin 5/325 prn. - Surgery and GI is following and we appreciate their help.  -Repeat CT showed no change in inflammatory process in RLQ - WBC increased overnight, will continue to follow this - surgery and GI both state patient is ok to d/c with good PO pain control, surgery recs 10-14 day course of cipro 500 BID and flagyl 500 TID  2. Anemia: likely related to  crohns, will continue to monitor - anemia panel provides mixed picture-iron 14, TIBC 197, Ferritin 51, retic count 1.8, folate 14.1, B12 242 - FOBT negative - Hb stable at 9.2 - will continue iron replacement  3. Hypokalemia: 3.7 today. Has been repleted several times.  4. FEN/GI:  F: decreased IV to 75 ml/hr E: hypokalemic as per above.  N: low fiber diet.   Code status: Full code   PPX: Lovenox, protonix IV   Dispo: Discharge today.  Marikay Alar, MD 08/05/2012, 8:48 AM

## 2012-08-05 NOTE — Progress Notes (Signed)
Patient ID: Gina Gonzalez, female   DOB: [birth year], 31 y.o.   MRN: [id number]    Subjective: Pt c/o bad night sweat last night.  Tolerating diet, still with some pain, but controlled by oral pain med  Objective: Vital signs in last 24 hours: Temp:  [98.3 F (36.8 C)-99.1 F (37.3 C)] 99.1 F (37.3 C) (09/24 0521) Pulse Rate:  [85-90] 90  (09/24 0521) Resp:  [18] 18  (09/24 0521) BP: (117-122)/(56-73) 119/60 mmHg (09/24 0521) SpO2:  [95 %-99 %] 95 % (09/24 0521) Last BM Date: 08/04/12  Intake/Output from previous day: 09/23 0701 - 09/24 0700 In: 1379 [P.O.:360; I.V.:1019] Out: -  Intake/Output this shift:    PE: Abd: soft, currently NT, ND, +BS  Lab Results:   Basename 08/05/12 0540 08/04/12 0700  WBC 13.7* 13.5*  HGB 9.2* 9.3*  HCT 30.4* 30.0*  PLT 403* 379   BMET  Basename 08/05/12 0540 08/04/12 0700  NA 138 136  K 3.7 3.3*  CL 101 101  CO2 26 24  GLUCOSE 100* 91  BUN 3* 4*  CREATININE 0.81 0.91  CALCIUM 9.5 9.0   PT/INR No results found for this basename: LABPROT:2,INR:2 in the last 72 hours CMP     Component Value Date/Time   NA 138 08/05/2012 0540   K 3.7 08/05/2012 0540   CL 101 08/05/2012 0540   CO2 26 08/05/2012 0540   GLUCOSE 100* 08/05/2012 0540   BUN 3* 08/05/2012 0540   CREATININE 0.81 08/05/2012 0540   CALCIUM 9.5 08/05/2012 0540   PROT 8.0 06/28/2012 1538   ALBUMIN 3.2* 06/28/2012 1538   AST 16 06/28/2012 1538   ALT 8 06/28/2012 1538   ALKPHOS 60 06/28/2012 1538   BILITOT 0.1* 06/28/2012 1538   GFRNONAA >90 08/05/2012 0540   GFRAA >90 08/05/2012 0540   Lipase     Component Value Date/Time   LIPASE 26 06/28/2012 1538       Studies/Results: Ct Abdomen Pelvis W Contrast  08/04/2012  *RADIOLOGY REPORT*  Clinical Data: Right lower quadrant inflammatory mass follow-up.  CT ABDOMEN AND PELVIS WITH CONTRAST  Technique:  Multidetector CT imaging of the abdomen and pelvis was performed following the standard protocol during bolus administration of  intravenous contrast.  Contrast: OMNIPAQUE IOHEXOL 300 MG/ML  SOLN  Comparison: 07/29/2012  Findings: Lung bases are clear.  Heart size within normal limits. No pleural or pericardial effusion.  Unremarkable liver, biliary system, spleen, pancreas, adrenal glands, kidneys.  No hydronephrosis or hydroureter.  Again appreciated is extensive mesenteric fat stranding and associated thickened loops of bowel within the right lower quadrant.  The majority of the inflammation centers about the terminal ileum though there are skipped segments with involved and unvolved small bowel bowel proximal to the TI.  There are locules of extraluminal gas within the inflammation, in keeping with contained perforation. Developing fistulas not excluded however no extraluminal contrast.  The bowel just proximal to the inflamed segments is mildly dilated at 3.2 cm however nonobstructed. Appendix again obscured.  No complete obstruction as contrast is noted within colon distal to the inflammatory process.  Prominent ileocolic lymph nodes, measuring up to a centimeter short axis.  Small amount of fluid within the right paracolic gutter, extends inferiorly into the pelvis measures higher than simple fluid.  No extravasated ingested contrast visualized.  Normal caliber aorta and branch vessels.  Decompressed bladder.  Left adnexal cyst.  No acute osseous finding.  IMPRESSION: Findings remain most in keeping with inflammatory bowel  disease with contained microperforation, though again appendiceal pathology not excluded.  There is evidence of delayed transit through the inflamed ileal segments though no evidence for complete obstruction.   Original Report Authenticated By: Waneta Martins, M.D.     Anti-infectives: Anti-infectives     Start     Dose/Rate Route Frequency Ordered Stop   08/03/12 2215   fluconazole (DIFLUCAN) tablet 150 mg        150 mg Oral  Once 08/03/12 2211 08/03/12 2231   07/30/12 0900   ciprofloxacin (CIPRO)  IVPB 400 mg  Status:  Discontinued        400 mg 200 mL/hr over 60 Minutes Intravenous Every 12 hours 07/30/12 0011 07/30/12 0726   07/30/12 0730  piperacillin-tazobactam (ZOSYN) IVPB 3.375 g       3.375 g 12.5 mL/hr over 240 Minutes Intravenous 3 times per day 07/30/12 0726     07/30/12 0600   metroNIDAZOLE (FLAGYL) IVPB 500 mg  Status:  Discontinued        500 mg 100 mL/hr over 60 Minutes Intravenous Every 8 hours 07/30/12 0011 07/30/12 0726   07/29/12 2045   ciprofloxacin (CIPRO) IVPB 400 mg        400 mg 200 mL/hr over 60 Minutes Intravenous  Once 07/29/12 2042 07/29/12 2219   07/29/12 2045   metroNIDAZOLE (FLAGYL) IVPB 500 mg        500 mg 100 mL/hr over 60 Minutes Intravenous  Once 07/29/12 2042 07/29/12 2222           Assessment/Plan  1. Presumed crohn's disease 2. microperf of SB  Plan: 1. CT scan is stable.  No acute surgical indications at this time.  Patient will need close GI follow up for management of presumed crohn's disease.  GI's note says that she will be started on samples of crohn's medication. 2. No other recommendations at this time, except she will still need 10-14 days more of oral abx therapy. 3. Stable for dc home from our standpoint.   LOS: 7 days    Kati Riggenbach E 08/05/2012, 8:59 AM Pager: [phone]

## 2012-08-05 NOTE — Progress Notes (Signed)
DC home with mom. Verbally understood dc instructions. No verbal questions asked

## 2012-08-07 NOTE — Discharge Summary (Signed)
I discussed with Dr Birdie Sons.  I agree with their plans documented in their Discharge note.

## 2012-08-15 ENCOUNTER — Encounter: Payer: Self-pay | Admitting: Family Medicine

## 2012-08-15 ENCOUNTER — Ambulatory Visit (INDEPENDENT_AMBULATORY_CARE_PROVIDER_SITE_OTHER): Payer: Self-pay | Admitting: Family Medicine

## 2012-08-15 VITALS — BP 136/60 | HR 89 | Temp 98.4°F | Ht 67.0 in | Wt 245.7 lb

## 2012-08-15 DIAGNOSIS — K5090 Crohn's disease, unspecified, without complications: Secondary | ICD-10-CM

## 2012-08-15 DIAGNOSIS — D649 Anemia, unspecified: Secondary | ICD-10-CM | POA: Insufficient documentation

## 2012-08-15 HISTORY — DX: Crohn's disease, unspecified, without complications: K50.90

## 2012-08-15 NOTE — Patient Instructions (Signed)
Thanks for coming in today!  Continue to advance your diet, just be sure to go slow.   Be sure to call the surgeon and make your appointment.

## 2012-08-15 NOTE — Progress Notes (Signed)
  Subjective:    Patient ID: Gina Gonzalez, female    DOB: 09-Jul-1981, 31 y.o.   MRN: [id number]  HPI 31 y/o female here for hospital follow up, hospitalized after microperforation of bowel thought to be secondary to new onset chron's.   Pain has improved, only needed vicoden once since discharge after eating a 4-6 oz portion of beef.  Was eating soft food and liquids only until the last couple of days, now eating chicken and beef too, no pain with the chicken.  Nausea resolved Good appetite Some weakness, no syncope/pre-syncope No black or bloody stools Has appt with GI going to call for surgery f/u     Review of Systems Constitutional: No anorexia, fever, chills, sweats  CV: No chest pain, edema, palpitations Resp: No  cough, dyspnea  GI: No melena/hematochezia, abdominal pain, GU: No dysuria, hematuria Neuro: No syncope, focal weakness, + weakness/fatigue Derm: No concerning skin lesions,      Objective:   Physical Exam  Gen: NAD, alert, cooperative with exam HEENT: NCAT, EOMI, PERRL, no thyromegaly CV: RRR, no murmur, good s1/s2 Resp: CTABL, no wheezes, non-labored Abd: S/ND, BS present, no guarding or organomegaly, mild tenderness to palp in RLQ Ext: No edema  Neuro: Alert and oriented, No gross deficits     Assessment & Plan:

## 2012-08-15 NOTE — Assessment & Plan Note (Signed)
Stable, Some feelings of weakness but largely unchanged from baseline.  Likely combination of iron deficiency anemia and anemia or chronic disease.  Anemia panel with low Iron, low TIBC, low ferritin.   I'd like to replace her iron but am unsure because of her current chron's flare and recent microperf. Will send her GI, Dr. Madilyn Fireman,  a letter and ask to coordinate care.   Defer CBC today as she doesn't appear to be acutely anemic and doesn't currently have funding, she agrees for financial reasons.

## 2012-08-15 NOTE — Assessment & Plan Note (Signed)
Flare improving, Recently diagnosed and hospitalized for microperf of bowel. Still taking regimen of Cipro and flagyl agreed upon by GI and surgery inpatient. She's been advancing her diet and pain has improved well.   She has an appt with the GI that she saw previously and he will be managing disease after this antibiotic course is finished.   Encouraged advancing as tolerate with smaller portions and finishing all of her antibiotics.

## 2012-08-18 ENCOUNTER — Telehealth: Payer: Self-pay | Admitting: Family Medicine

## 2012-08-18 MED ORDER — FLUCONAZOLE 150 MG PO TABS: 150.0000 mg | ORAL_TABLET | Freq: Once | ORAL | Status: DC

## 2012-08-18 NOTE — Telephone Encounter (Signed)
Pt states her sx are burning and itching in vaginal area, same as previous-diflucan works well for her. Pt states she is eating yogurt-it its not helping.

## 2012-08-18 NOTE — Telephone Encounter (Signed)
Advised pt of med at pharmacy.

## 2012-08-18 NOTE — Telephone Encounter (Signed)
Patient is taking an abx and has developed symptoms of a yeast infection and would like something sent to her pharmacy.  She uses [facility] on [address].

## 2012-08-18 NOTE — Telephone Encounter (Signed)
Would you mind calling this patient and telling her I sent one diflucan pill that will treat her yeast infection.   Thanks so much!  Sam

## 2013-05-29 ENCOUNTER — Encounter (HOSPITAL_COMMUNITY): Payer: Self-pay | Admitting: Family Medicine

## 2013-05-29 ENCOUNTER — Emergency Department (HOSPITAL_COMMUNITY)
Admission: EM | Admit: 2013-05-29 | Discharge: 2013-05-30 | Disposition: A | Discharge status: Home / Self Care | Payer: Self-pay | Attending: Emergency Medicine | Admitting: Emergency Medicine

## 2013-05-29 DIAGNOSIS — Z3202 Encounter for pregnancy test, result negative: Secondary | ICD-10-CM | POA: Insufficient documentation

## 2013-05-29 DIAGNOSIS — R109 Unspecified abdominal pain: Secondary | ICD-10-CM

## 2013-05-29 DIAGNOSIS — K5000 Crohn's disease of small intestine without complications: Secondary | ICD-10-CM | POA: Insufficient documentation

## 2013-05-29 DIAGNOSIS — G8929 Other chronic pain: Secondary | ICD-10-CM | POA: Insufficient documentation

## 2013-05-29 DIAGNOSIS — K5090 Crohn's disease, unspecified, without complications: Secondary | ICD-10-CM | POA: Insufficient documentation

## 2013-05-29 HISTORY — DX: Crohn's disease, unspecified, without complications: K50.90

## 2013-05-29 LAB — URINALYSIS, ROUTINE W REFLEX MICROSCOPIC
Bilirubin Urine: NEGATIVE
Glucose, UA: NEGATIVE mg/dL
Ketones, ur: NEGATIVE mg/dL
Protein, ur: NEGATIVE mg/dL
Urobilinogen, UA: 0.2 mg/dL (ref 0.0–1.0)

## 2013-05-29 LAB — CBC WITH DIFFERENTIAL/PLATELET
Basophils Absolute: 0.1 10*3/uL (ref 0.0–0.1)
HCT: 33.7 % — ABNORMAL LOW (ref 36.0–46.0)
Lymphocytes Relative: 22 % (ref 12–46)
Monocytes Absolute: 0.7 10*3/uL (ref 0.1–1.0)
Neutro Abs: 6.5 10*3/uL (ref 1.7–7.7)
Neutrophils Relative %: 66 % (ref 43–77)
RDW: 13 % (ref 11.5–15.5)
WBC: 9.9 10*3/uL (ref 4.0–10.5)

## 2013-05-29 LAB — COMPREHENSIVE METABOLIC PANEL
ALT: 9 U/L (ref 0–35)
AST: 9 U/L (ref 0–37)
Alkaline Phosphatase: 57 U/L (ref 39–117)
CO2: 29 mEq/L (ref 19–32)
Chloride: 102 mEq/L (ref 96–112)
Creatinine, Ser: 0.77 mg/dL (ref 0.50–1.10)
GFR calc non Af Amer: >90 mL/min (ref >90)
Potassium: 3.6 mEq/L (ref 3.5–5.1)
Sodium: 138 mEq/L (ref 135–145)
Total Bilirubin: 0.2 mg/dL — ABNORMAL LOW (ref 0.3–1.2)

## 2013-05-29 LAB — URINE MICROSCOPIC-ADD ON

## 2013-05-29 NOTE — ED Provider Notes (Signed)
History    CSN: [id number] Arrival date & time 05/29/13  1635  First MD Initiated Contact with Patient 05/29/13 2353     Chief Complaint  Patient presents with  . Abdominal Pain   (Consider location/radiation/quality/duration/timing/severity/associated sxs/prior Treatment) HPI Gina Gonzalez is a very pleasant 32 yo woman who was diagnosed with Crohn's disease in Sept 2013. She presents with complaints of daily RLQ abdominal pain which seems to come on after meals. The pain is intermittent. Sometimes it wakes her at night. Patient is currently pain free. She denies fever. Rates pain 6/10 at worst. Patient is sometimes nauseated with intense pain and has occasional NBNB emesis. None x 2 days.   The patient denies history of abdominal surgeries.  Crohn's disease has been diagnosed based on CT findings and the patient has not undergone colonoscopy. She was admitted in Sept 2013 with concern for microperforation of the terminal ileus. She says that, at that time, she felt much worse than she has in the past few days. In September she was in severe pain and had a fever and night sweats.   Patient denies any GU sx. NO bloody stools. Normal BMs. Last GI appointment was in Oct 2013. The patient is not currently on any medications.  Past Medical History  Diagnosis Date  . Crohn disease    Past Surgical History  Procedure Laterality Date  . Tonsillectomy     Family History  Problem Relation Age of Onset  . Thyroid disease Mother   . Cancer Maternal Grandmother    History  Substance Use Topics  . Smoking status: Never Smoker   . Smokeless tobacco: Not on file  . Alcohol Use: No   OB History   Grav Para Term Preterm Abortions TAB SAB Ect Mult Living                 Review of Systems Gen: no weight loss, fevers, chills, night sweats Eyes: no discharge or drainage, no occular pain or visual changes Nose: no epistaxis or rhinorrhea Mouth: no dental pain, no sore throat Neck: no neck  pain Lungs: no SOB, cough, wheezing CV: no chest pain, palpitations, dependent edema or orthopnea Abd: As per history of present illness, otherwise negative GU: no dysuria or gross hematuria MSK: no myalgias or arthralgias Neuro: no headache, no focal neurologic deficits Skin: no rash Psyche: negative.  Allergies  Review of patient's allergies indicates no known allergies.  Home Medications   Current Outpatient Rx  Name  Route  Sig  Dispense  Refill  . Aspirin-Acetaminophen-Caffeine (PAMPRIN MAX PO)   Oral   Take 1 tablet by mouth daily as needed. For pain         . HYDROcodone-acetaminophen (NORCO/VICODIN) 5-325 MG per tablet   Oral   Take 1 tablet by mouth every 4 (four) hours as needed.   30 tablet   0   . ibuprofen (ADVIL,MOTRIN) 200 MG tablet   Oral   Take 600 mg by mouth every 6 (six) hours as needed. For pain         . promethazine (PHENERGAN) 25 MG tablet   Oral   Take 25 mg by mouth every 6 (six) hours as needed. For nausea         . Simethicone (ANTI GAS PO)   Oral   Take 1 capsule by mouth daily.          BP 119/56  Pulse 72  Temp(Src) 98 F (36.7 C) (Oral)  Resp  18  SpO2 98%  LMP 05/25/2013 Physical Exam Gen: well developed and well nourished appearing, very cheerful, smiling and laughing. Head: NCAT Eyes: PERL, EOMI Nose: no epistaixis or rhinorrhea Mouth/throat: mucosa is moist and pink Neck: supple, no stridor Lungs: CTA B, no wheezing, rhonchi or rales CV: RRR, no murmur Abd: obese, soft, tenderness over the right lower quadrant without rebound or guarding nondistended Back: no ttp, no cva ttp Skin: no rashese, wnl Neuro: CN ii-xii grossly intact, no focal deficits Psyche; normal affect,  calm and cooperative.   ED Course  Procedures (including critical care time) Results for orders placed during the hospital encounter of 05/29/13 (from the past 24 hour(s))  URINALYSIS, ROUTINE W REFLEX MICROSCOPIC     Status: Abnormal    Collection Time    05/29/13  5:20 PM      Result Value Range   Color, Urine YELLOW  YELLOW   APPearance HAZY (*) CLEAR   Specific Gravity, Urine 1.035 (*) 1.005 - 1.030   pH 6.0  5.0 - 8.0   Glucose, UA NEGATIVE  NEGATIVE mg/dL   Hgb urine dipstick TRACE (*) NEGATIVE   Bilirubin Urine NEGATIVE  NEGATIVE   Ketones, ur NEGATIVE  NEGATIVE mg/dL   Protein, ur NEGATIVE  NEGATIVE mg/dL   Urobilinogen, UA 0.2  0.0 - 1.0 mg/dL   Nitrite NEGATIVE  NEGATIVE   Leukocytes, UA MODERATE (*) NEGATIVE  URINE MICROSCOPIC-ADD ON     Status: Abnormal   Collection Time    05/29/13  5:20 PM      Result Value Range   Squamous Epithelial / LPF MANY (*) RARE   WBC, UA 7-10  <3 WBC/hpf   RBC / HPF 0-2  <3 RBC/hpf   Bacteria, UA FEW (*) RARE   Urine-Other MUCOUS PRESENT    CBC WITH DIFFERENTIAL     Status: Abnormal   Collection Time    05/29/13  5:25 PM      Result Value Range   WBC 9.9  4.0 - 10.5 K/uL   RBC 3.97  3.87 - 5.11 MIL/uL   Hemoglobin 11.2 (*) 12.0 - 15.0 g/dL   HCT 16.1 (*) 09.6 - 04.5 %   MCV 84.9  78.0 - 100.0 fL   MCH 28.2  26.0 - 34.0 pg   MCHC 33.2  30.0 - 36.0 g/dL   RDW 40.9  81.1 - 91.4 %   Platelets 363  150 - 400 K/uL   Neutrophils Relative % 66  43 - 77 %   Neutro Abs 6.5  1.7 - 7.7 K/uL   Lymphocytes Relative 22  12 - 46 %   Lymphs Abs 2.2  0.7 - 4.0 K/uL   Monocytes Relative 7  3 - 12 %   Monocytes Absolute 0.7  0.1 - 1.0 K/uL   Eosinophils Relative 4  0 - 5 %   Eosinophils Absolute 0.4  0.0 - 0.7 K/uL   Basophils Relative 1  0 - 1 %   Basophils Absolute 0.1  0.0 - 0.1 K/uL  COMPREHENSIVE METABOLIC PANEL     Status: Abnormal   Collection Time    05/29/13  5:25 PM      Result Value Range   Sodium 138  135 - 145 mEq/L   Potassium 3.6  3.5 - 5.1 mEq/L   Chloride 102  96 - 112 mEq/L   CO2 29  19 - 32 mEq/L   Glucose, Bld 99  70 - 99 mg/dL   BUN  14  6 - 23 mg/dL   Creatinine, Ser 1.61  0.50 - 1.10 mg/dL   Calcium 9.0  8.4 - 09.6 mg/dL   Total Protein 7.5  6.0 -  8.3 g/dL   Albumin 3.2 (*) 3.5 - 5.2 g/dL   AST 9  0 - 37 U/L   ALT 9  0 - 35 U/L   Alkaline Phosphatase 57  39 - 117 U/L   Total Bilirubin 0.2 (*) 0.3 - 1.2 mg/dL   GFR calc non Af Amer >90  >90 mL/min   GFR calc Af Amer >90  >90 mL/min  PREGNANCY, URINE     Status: None   Collection Time    05/30/13 12:33 AM      Result Value Range   Preg Test, Ur NEGATIVE  NEGATIVE   CT ABDOMEN AND PELVIS WITH CONTRAST  Technique: Multidetector CT imaging of the abdomen and pelvis was performed following the standard protocol during bolus administration of intravenous contrast.  Contrast: OMNIPAQUE IOHEXOL 300 MG/ML SOLN  Comparison: 08/04/2012  Findings: Examination is degraded by patient motion. Lung bases are clear in their otherwise visualized aspects.  Liver, gallbladder, adrenal glands, kidneys, spleen, and pancreas are normal. No free air. A small amount of low density free pelvic fluid is identified. Uterus and ovaries are normal.  There is extensive terminal ileal wall thickening, surrounding stranding, and right lower quadrant fluid tracking to the pelvis. Numerous right lower quadrant enlarged mesenteric lymph nodes are noted, representative node 1 cm in short axis diameter image 50. Compared to the prior exam, there is increased terminal ileal wall thickening. A small amount fluid tracking along the small bowel, image 52, measures 3.0 cm maximally. This is present on the previous study as well. It appears slightly larger with relatively more convex margins.  No acute osseous finding.  IMPRESSION: Increase in terminal ileal wall thickening, surrounding inflammatory change, and right lower quadrant lymphadenopathy, compatible with the provided history of Crohn's disease. Slightly increased interloop fluid could reflect inflammatory change although a developing interloop abscess could have a similar appearance.   MDM  CT scan reviewed with Dr. Georg Ruddle, [facility] on  call. He does not appreciate any significant change when compared to Sept 2013 study. The patient is afebrile, nontoxic appearing, has a normal WBC, normal VS and is tolerating po. No peritoneal signs on exam.   Case discussed with on call gastroenterologist in with [facility]. He recommends empiric tx with Cipro and Flagyl and tx with prednisone 60mg  qd with plan to f/u in clinic on Monday.   The patient is amenable to this plan. She is tolerating juice and crackers in the emergency department. We have given first dose of Cipro and Flagyl. The patient is counseled to return promptly to the emergency department should she have worsening symptoms, fever, persistent vomiting or any other urgent health concerns. She will call [facility] on Monday for an appointment time.  Brandt Loosen, MD 05/30/13 [phone]

## 2013-05-29 NOTE — ED Notes (Signed)
Per pt sts 1 month of abdominal pain intermittently. sts her chrohns is flared up. sts that she has changed her diet and still not better. sts some diarrhea.

## 2013-05-30 ENCOUNTER — Emergency Department (HOSPITAL_COMMUNITY): Payer: Self-pay

## 2013-05-30 MED ORDER — METRONIDAZOLE 500 MG PO TABS: 500.0000 mg | ORAL_TABLET | Freq: Two times a day (BID) | ORAL | Status: DC

## 2013-05-30 MED ORDER — IOHEXOL 300 MG/ML  SOLN: 25.0000 mL | Freq: Once | INTRAMUSCULAR | Status: DC | PRN

## 2013-05-30 MED ORDER — SODIUM CHLORIDE 0.9 % IV SOLN
Freq: Once | INTRAVENOUS | Status: AC
  Administered 2013-05-30: 04:00:00 via INTRAVENOUS

## 2013-05-30 MED ORDER — OXYCODONE-ACETAMINOPHEN 5-325 MG PO TABS: 2.0000 | ORAL_TABLET | ORAL | Status: DC | PRN

## 2013-05-30 MED ORDER — METRONIDAZOLE IN NACL 5-0.79 MG/ML-% IV SOLN
500.0000 mg | Freq: Once | INTRAVENOUS | Status: AC
  Administered 2013-05-30: 500 mg via INTRAVENOUS
  Filled 2013-05-30: qty 100

## 2013-05-30 MED ORDER — ONDANSETRON HCL 4 MG PO TABS: 4.0000 mg | ORAL_TABLET | Freq: Four times a day (QID) | ORAL | Status: DC

## 2013-05-30 MED ORDER — PREDNISONE 20 MG PO TABS: 60.0000 mg | ORAL_TABLET | Freq: Every day | ORAL | Status: DC

## 2013-05-30 MED ORDER — CIPROFLOXACIN HCL 500 MG PO TABS: 500.0000 mg | ORAL_TABLET | Freq: Two times a day (BID) | ORAL | Status: DC

## 2013-05-30 MED ORDER — IOHEXOL 300 MG/ML  SOLN
100.0000 mL | Freq: Once | INTRAMUSCULAR | Status: AC | PRN
  Administered 2013-05-30: 100 mL via INTRAVENOUS

## 2013-05-30 MED ORDER — CIPROFLOXACIN IN D5W 400 MG/200ML IV SOLN
400.0000 mg | Freq: Once | INTRAVENOUS | Status: AC
  Administered 2013-05-30: 400 mg via INTRAVENOUS
  Filled 2013-05-30: qty 200

## 2013-05-30 MED ORDER — FLUCONAZOLE 150 MG PO TABS: 150.0000 mg | ORAL_TABLET | Freq: Once | ORAL | Status: DC

## 2013-05-31 LAB — URINE CULTURE: Colony Count: >=100000

## 2013-07-30 ENCOUNTER — Ambulatory Visit (INDEPENDENT_AMBULATORY_CARE_PROVIDER_SITE_OTHER): Payer: Self-pay | Admitting: Family Medicine

## 2013-07-30 ENCOUNTER — Encounter: Payer: Self-pay | Admitting: Family Medicine

## 2013-07-30 VITALS — BP 116/82 | HR 93 | Temp 98.8°F | Ht 67.0 in | Wt 243.0 lb

## 2013-07-30 DIAGNOSIS — K5090 Crohn's disease, unspecified, without complications: Secondary | ICD-10-CM

## 2013-07-30 DIAGNOSIS — R1032 Left lower quadrant pain: Secondary | ICD-10-CM

## 2013-07-30 DIAGNOSIS — R1031 Right lower quadrant pain: Secondary | ICD-10-CM

## 2013-07-30 DIAGNOSIS — R52 Pain, unspecified: Secondary | ICD-10-CM

## 2013-07-30 MED ORDER — METRONIDAZOLE 500 MG PO TABS: 500.0000 mg | ORAL_TABLET | Freq: Two times a day (BID) | ORAL | Status: DC

## 2013-07-30 MED ORDER — CIPROFLOXACIN HCL 500 MG PO TABS: 500.0000 mg | ORAL_TABLET | Freq: Two times a day (BID) | ORAL | Status: DC

## 2013-07-30 MED ORDER — HYDROCODONE-ACETAMINOPHEN 10-325 MG PO TABS: 1.0000 | ORAL_TABLET | Freq: Three times a day (TID) | ORAL | Status: DC | PRN

## 2013-07-30 MED ORDER — PREDNISONE 50 MG PO TABS: 50.0000 mg | ORAL_TABLET | Freq: Every day | ORAL | Status: DC

## 2013-07-30 MED ORDER — PREDNISONE 10 MG PO TABS: ORAL_TABLET | ORAL | Status: DC

## 2013-07-30 NOTE — Patient Instructions (Addendum)
Thank you for coming in, today!  I think you are in the middle of another flare of Crohn's disease.  I am prescribing the same two antibiotics you took before - Cipro and Flagyl  You should take both pills twice per day for 2 weeks.  I am also prescribing a course of prednisone.  You should take 50 mg once a day for 2 weeks.   After that, you should slowly taper your dose down by 5 mg, every week.  Follow the instructions on your prescription.  DO NOT start taking this until after you are done with 50 mg for 2 weeks.  Then you should take 45 mg for 7 days, 40 for 7 days, 35 for 7 days, etc.  Make an appointment to see Dr. Ermalinda Memos in about 2 weeks. Talk to him about going back to see your GI doctor, in the next 1-2 months. If you have any worse pain, start running fevers or have blood in your stool, or have other new symptoms, come back sooner rather than later. Please feel free to call with any questions or concerns at any time, at [phone]. --Dr. Casper Harrison

## 2013-07-30 NOTE — Progress Notes (Signed)
  Subjective:    Patient ID: Gina Gonzalez, female    DOB: 1981/08/24, 32 y.o.   MRN: [id number]  HPI: PT presents to clinic with complaint of abdominal pain, concerned that she is having a flare of crohn's disease -last flare was in July, given Cipro and Flagyl and prednisone from the ED, which helped for "a few weeks" -pt finished those meds and took some "leftover prednisone" which also helped for a few weeks after that -pt had some "hot/cold flashes," sweating, and nausea after finishing/stopping/running out of this "extra" prednisone, for a few days last month -current pain is 1/10 this morning, with only some right-sided abdominal tenderness; pt has "good days and bad days," with pain sometimes 9/10 at night -pt took ibuprofen this morning; current symptoms have been present "of and on" for at least a few weeks -was seen in the ED as above and has taken Percocet and Norco in the past for pain --> Norco helps more, doesn't make her as drowsy -pt notes "this is a new diagnosis, I only got it last year about this time"  -has seen GI, but "not in a while" due to having no insurance  -has NOT had any surgeries  -generally felt well for 6-8 months after her initial hospitalization/diagnosis  Review of Systems: As above. Generally denies fevers, no constipation, but does endorse diarrhea which has been "normal" or "usual" since onset of Crohn's. -no blood in stool, no melena -some nausea as above; few episodes of emesis with worse diarrhea about 2 weeks ago, self-limited/resolved, helped with dramamine over the counter     Objective:   Physical Exam BP 116/82  Pulse 93  Temp(Src) 98.8 F (37.1 C) (Oral)  Ht 5\' 7"  (1.702 m)  Wt 243 lb (110.224 kg)  BMI 38.05 kg/m2  LMP 07/24/2013  Gen: well-appearing adult female in NAD, pleasant and cooperative HEENT: [place]/AT, MMM, PERRLA, EOMI, sclerae/conjunctivae/TMs clear bilaterally, posterior oropharynx clear Neck: no cervical lymph adenopathy,  thyroid smooth/non-enlarged/nontender Cardio: RRR, no murmur Pulm: CTAB, no wheezes, normal WOB Abd: soft, obese, mild tenderness to deep palpation on the right lower quadrant, some diffuse mild tenderness otherwise  No flank or CVA tenderness, no suprapubic or epigastric tenderness; mild periumbilical tenderness  No Murphy or Rosving signs, no peritoneal signs; pt able to stand/walk, move, sit up unassisted without great pain Ext: warm, well-perfused, no LE edema, redness, or tenderness     Assessment & Plan:

## 2013-07-30 NOTE — Assessment & Plan Note (Addendum)
A: Symptoms consistent with mild/moderate flare, and/or incompletely resolved flare from several weeks ago. No peritoneal signs or vital sign abnormalities to suggest frank peritonitis or other severe intra-abdominal process. Note pt has not seen GI in at least several months due to lack of insurance. Also out of pain medication stronger than ibuprofen.  P: Rx for ciprofloxacin 500 mg BID, metronidazole 500 mg BID, and prednisone 50 mg BID, all for 14 days (similar to her previous treatment). After that, plan for long steroid taper (decrease by 5 mg every 7 days until stopping). Rx for Norco for pain. Strongly advised pt not to abruptly stop prednisone (or to take steroids without provider evaluation) and to finish steroid prescriptions strictly as instructed to help avoid withdrawal/adrenal insufficiency symptoms. Will need follow up with GI in the relatively near future, if nothing else to discuss chronic/maintenance medications-- definitely want to avoid over-treating with steroids, and to avoid inappropriate use of abx/development of resistance, etc. Red flags/warning signs for worsening disease or frank infection reviewed. Pt to f/u with PCP Dr. Ermalinda Memos in 2 weeks. I discussed her care with him, as well.

## 2013-10-23 ENCOUNTER — Encounter: Payer: Self-pay | Admitting: Family Medicine

## 2013-10-23 ENCOUNTER — Ambulatory Visit (INDEPENDENT_AMBULATORY_CARE_PROVIDER_SITE_OTHER): Payer: Self-pay | Admitting: Family Medicine

## 2013-10-23 VITALS — BP 126/79 | HR 93 | Temp 98.2°F | Ht 67.5 in | Wt 243.0 lb

## 2013-10-23 DIAGNOSIS — K5090 Crohn's disease, unspecified, without complications: Secondary | ICD-10-CM

## 2013-10-23 DIAGNOSIS — K5000 Crohn's disease of small intestine without complications: Secondary | ICD-10-CM

## 2013-10-23 DIAGNOSIS — R109 Unspecified abdominal pain: Secondary | ICD-10-CM | POA: Insufficient documentation

## 2013-10-23 MED ORDER — PREDNISONE 10 MG PO TABS: ORAL_TABLET | ORAL | Status: DC

## 2013-10-23 MED ORDER — PREDNISONE 5 MG PO TABS: ORAL_TABLET | ORAL | Status: DC

## 2013-10-23 MED ORDER — PREDNISONE 20 MG PO TABS: 40.0000 mg | ORAL_TABLET | Freq: Every day | ORAL | Status: DC

## 2013-10-23 MED ORDER — HYDROCODONE-ACETAMINOPHEN 10-325 MG PO TABS: 1.0000 | ORAL_TABLET | Freq: Three times a day (TID) | ORAL | Status: DC | PRN

## 2013-10-23 NOTE — Patient Instructions (Addendum)
Make an appointment to see if you call DSS to apply for medicaid  Call [facility] in our clinic for help applying for the exchanges  After you do not qualify you can apply for the orange card.   SEEK IMMEDIATE MEDICAL CARE IF:  You have increased pain not controlled by your medications.   You vomit blood.  You have uncontrolled vomiting or nausea.  You cannot drink fluids due to vomiting or pain.  You develop confusion.  You begin feeling very dry or thirsty (dehydrated).  You have severe bloating.  You have chills.  You have a fever.  You feel extremely weak or you faint.

## 2013-10-23 NOTE — Progress Notes (Signed)
Patient ID: Gina Gonzalez, female   DOB: [birth year], 32 y.o.   MRN: [id number]  Kevin Fenton, MD Phone: [phone]  Subjective:  Chief complaint-noted  # Same-day for Crohn's disease  32 year old female with Crohn's disease diagnosed after a hospitalization about one year and 3 months ago. She was seen approximately 3 months ago in our clinic with abdominal pain and treated for an acute flare that time and possible and dissection source with Cipro/Flagyl and a very long course of prednisone. She states that whenever her prednisone dose tapered beneath approximately 30 mg her symptoms of abdominal pain return. She's now been off the steroids completely for about one week.  She states approximately 5 days ago she began having "flulike" symptoms. She complains of chills, nausea, 2 episodes of vomiting (nonbloody nonbilious), night sweats, and increased abdominal pain. She states that her abdominal pain today is approximately 5/10 right lower quadrant pain that radiating slightly upwards. In general it is sharp in character and wavers between a 1/10-9/10 pain. Her pain seems to be only alleviated by ibuprofen or Norco. Her pain is aggravated by specific foods and by pushing on it. She did not use any pain medications that were prescribed last visit until after she got down to about 30 mg of prednisone when her abdominal pain returned. She's been using one half pill. It is down to one half pill this morning. She states that she has not been seen anywhere else since September for this pain. - ROS- Positive objective fever, chills, and sweats No dyspnea No chest pain Positive abdominal pain No melena or hematochezia No dysuria No tenderness of the abdomen with movement. Past Medical History Patient Active Problem List   Diagnosis Date Noted  . Crohn's disease 08/15/2012  . Anemia 08/15/2012  . Abdominal pain, acute, bilateral lower quadrant 07/30/2012  . Fever 07/30/2012  . Perforated small  intestine 07/29/2012    Medications- reviewed and updated Current Outpatient Prescriptions  Medication Sig Dispense Refill  . Aspirin-Acetaminophen-Caffeine (PAMPRIN MAX PO) Take 1 tablet by mouth daily as needed. For pain      . ciprofloxacin (CIPRO) 500 MG tablet Take 1 tablet (500 mg total) by mouth 2 (two) times daily.  28 tablet  0  . HYDROcodone-acetaminophen (NORCO) 10-325 MG per tablet Take 1 tablet by mouth every 8 (eight) hours as needed.  45 tablet  0  . ibuprofen (ADVIL,MOTRIN) 200 MG tablet Take 600 mg by mouth every 6 (six) hours as needed. For pain      . metroNIDAZOLE (FLAGYL) 500 MG tablet Take 1 tablet (500 mg total) by mouth 2 (two) times daily.  28 tablet  0  . predniSONE (DELTASONE) 20 MG tablet Take 2 tablets (40 mg total) by mouth daily. Reduce dose after week one as directed.  35 tablet  0  . predniSONE (DELTASONE) 5 MG tablet Start on Week two  Add 3 pills daily to 20 mg tab to make 35 mg for 7 days 2 pills daily to get 30 mg for the next two weeks.  Make appt for further Rx  49 tablet  0  . Simethicone (ANTI GAS PO) Take 1 capsule by mouth daily.       No current facility-administered medications for this visit.    Objective: BP 126/79  Pulse 93  Temp(Src) 98.2 F (36.8 C) (Oral)  Ht 5' 7.5" (1.715 m)  Wt 243 lb (110.224 kg)  BMI 37.48 kg/m2  LMP 09/26/2013 Gen: NAD, alert, cooperative with exam HEENT:  NCAT, EOMI, PERRL, no oral lesions CV: RRR, good S1/S2, no murmur Resp: CTABL, no wheezes, non-labored Abd: Soft, tenderness to palpation of the right lower quadrant and the lower part of right upper quadrant, positive bowel sounds, no rebound tenderness, tenderness is mostly on deep palpation. No guarding Ext: No edema, warm Neuro: Alert and oriented, No gross deficits, normal gait   Assessment/Plan:  Crohn's disease Symptoms consistent with persistent Crohn's disease pains associated with recent stopping of her prednisone. As her symptoms were  flulike and have her alleviated somewhat in the last 2 days will consider possibility of her having flulike illness, or actual flu, on top of the and return symptoms being off the steroids.  Discussed at length the need to see GI, however she has no access without insurance for now. I do not prefer to that at this time I think there is no other way to manage her without putting her on a long course of steroids. Also given the same Norco prescription that she was given 3 months ago which is 4510 mg tablets. We'll treat with prednisone 40 mg for 7 days, 35 mg for 7 days, 30 mg for 2 weeks. I asked her to call me after 3 weeks and will reassess her pain and try to continue decreasing his dose versus continuing at 30 mg if this is what she requires.  CBC, CMP today. She did refuse it she states she cannot afford it. Advised to call DSS to apply for Medicaid, also has to talk to Abundio Miu in our clinic to help her coordinate applying for the exchanges, and orange card if she fails those measures. Considered CT abdomen today however her abdomen is nonacute and I don't think that it would change our management. She needs she GI referral however she cannot afford this at this time.    Orders Placed This Encounter  Procedures  . CBC with Differential  . Comprehensive metabolic panel    Meds ordered this encounter  Medications  . DISCONTD: predniSONE (DELTASONE) 10 MG tablet    Sig: Start on Week two  Add 3 pills daily to 20 mg tab to make 35 mg for 7 days 2 pills daily to get 30 mg for the next two weeks.  Make appt for further Rx    Dispense:  49 tablet    Refill:  0  . predniSONE (DELTASONE) 20 MG tablet    Sig: Take 2 tablets (40 mg total) by mouth daily. Reduce dose after week one as directed.    Dispense:  35 tablet    Refill:  0  . HYDROcodone-acetaminophen (NORCO) 10-325 MG per tablet    Sig: Take 1 tablet by mouth every 8 (eight) hours as needed.    Dispense:  45 tablet     Refill:  0  . predniSONE (DELTASONE) 5 MG tablet    Sig: Start on Week two  Add 3 pills daily to 20 mg tab to make 35 mg for 7 days 2 pills daily to get 30 mg for the next two weeks.  Make appt for further Rx    Dispense:  49 tablet    Refill:  0

## 2013-10-23 NOTE — Assessment & Plan Note (Signed)
Symptoms consistent with persistent Crohn's disease pains associated with recent stopping of her prednisone. As her symptoms were flulike and have her alleviated somewhat in the last 2 days will consider possibility of her having flulike illness, or actual flu, on top of the and return symptoms being off the steroids.  Discussed at length the need to see GI, however she has no access without insurance for now. I do not prefer to that at this time I think there is no other way to manage her without putting her on a long course of steroids. Also given the same Norco prescription that she was given 3 months ago which is 4510 mg tablets. We'll treat with prednisone 40 mg for 7 days, 35 mg for 7 days, 30 mg for 2 weeks. I asked her to call me after 3 weeks and will reassess her pain and try to continue decreasing his dose versus continuing at 30 mg if this is what she requires.  CBC, CMP today. She did refuse it she states she cannot afford it. Advised to call DSS to apply for Medicaid, also has to talk to Abundio Miu in our clinic to help her coordinate applying for the exchanges, and orange card if she fails those measures. Considered CT abdomen today however her abdomen is nonacute and I don't think that it would change our management. She needs she GI referral however she cannot afford this at this time.

## 2013-11-09 ENCOUNTER — Telehealth: Payer: Self-pay | Admitting: Family Medicine

## 2013-11-09 DIAGNOSIS — K5090 Crohn's disease, unspecified, without complications: Secondary | ICD-10-CM

## 2013-11-09 MED ORDER — MESALAMINE ER 500 MG PO CPCR: 1000.0000 mg | ORAL_CAPSULE | Freq: Four times a day (QID) | ORAL | Status: DC

## 2013-11-09 MED ORDER — PREDNISONE 20 MG PO TABS: 40.0000 mg | ORAL_TABLET | Freq: Every day | ORAL | Status: DC

## 2013-11-09 MED ORDER — PROMETHAZINE HCL 12.5 MG PO TABS: 12.5000 mg | ORAL_TABLET | Freq: Three times a day (TID) | ORAL | Status: DC | PRN

## 2013-11-09 NOTE — Telephone Encounter (Signed)
Pt called increasing her dosage of prednisone to 60 mg a day. She said it is not helping when she takes 40 mg. Gina Gonzalez

## 2013-11-09 NOTE — Telephone Encounter (Signed)
Will fwd to MD for advice.  Nancylee Gaines L, CMA  

## 2013-11-09 NOTE — Telephone Encounter (Signed)
Called patient to discuss her prednisone use.   She states her pain never improved with starting 40 mg prednisone after our last visit. She is now on 30 mg prednisone daily and has had increased pain at this dose. Her pain meds are helping marginally.   I explained that pain meds and increasing doses of prednisone are not a permanent solution. I explained the repercussions of chronic prednisone treatment including cushinoid body habitus, DM2, HTN, osteoporosis etc.   Since our visit she has only attempted to get insurance through the exchanges. She has not formally applied for medicade. She states that she cannot afford the insurance. I advised her to call Abundio Miu for advice today.   I will continue her at 40 mg daily for the next 3 weeks. I also prescribed 1 g mesalamine QID for 3 weeks. I asked her to follow up with me in 3 weeks to see where she is in her pain.   She states that she has been throwing up since this morning and has not been able to tolerate PO since then. Her last BM was 1 day ago, she is passing flatus, and her emesis is NBNB. She states that she feels so poorly taht she cant make phone calls and she considered being seen in the ED b/c she had to wait for me to return her call. She notes a new sharp abd pain this am. I reviewed in detail teh signs of obstruction and advised her to seek help in the ED if she is having these.   Will plan to follow up in 3 weeks and re-evaluate then.   Murtis Sink, MD [facility] Resident, PGY-2 11/09/2013, 1:50 PM

## 2013-11-10 ENCOUNTER — Inpatient Hospital Stay (HOSPITAL_COMMUNITY)
Admission: EM | Admit: 2013-11-10 | Discharge: 2013-11-17 | DRG: 385 | Disposition: A | Discharge status: Home / Self Care | Payer: Self-pay | Attending: Family Medicine | Admitting: Family Medicine

## 2013-11-10 ENCOUNTER — Encounter (HOSPITAL_COMMUNITY): Payer: Self-pay | Admitting: Emergency Medicine

## 2013-11-10 ENCOUNTER — Emergency Department (HOSPITAL_COMMUNITY): Payer: Self-pay

## 2013-11-10 DIAGNOSIS — K648 Other hemorrhoids: Secondary | ICD-10-CM | POA: Diagnosis present

## 2013-11-10 DIAGNOSIS — R1031 Right lower quadrant pain: Secondary | ICD-10-CM

## 2013-11-10 DIAGNOSIS — R509 Fever, unspecified: Secondary | ICD-10-CM

## 2013-11-10 DIAGNOSIS — K630 Abscess of intestine: Secondary | ICD-10-CM

## 2013-11-10 DIAGNOSIS — E876 Hypokalemia: Secondary | ICD-10-CM | POA: Diagnosis present

## 2013-11-10 DIAGNOSIS — K651 Peritoneal abscess: Secondary | ICD-10-CM | POA: Diagnosis present

## 2013-11-10 DIAGNOSIS — K5000 Crohn's disease of small intestine without complications: Secondary | ICD-10-CM

## 2013-11-10 DIAGNOSIS — D649 Anemia, unspecified: Secondary | ICD-10-CM

## 2013-11-10 DIAGNOSIS — K5010 Crohn's disease of large intestine without complications: Principal | ICD-10-CM | POA: Diagnosis present

## 2013-11-10 DIAGNOSIS — K560 Paralytic ileus: Secondary | ICD-10-CM | POA: Diagnosis present

## 2013-11-10 DIAGNOSIS — K644 Residual hemorrhoidal skin tags: Secondary | ICD-10-CM | POA: Diagnosis present

## 2013-11-10 DIAGNOSIS — K50114 Crohn's disease of large intestine with abscess: Secondary | ICD-10-CM

## 2013-11-10 DIAGNOSIS — D72829 Elevated white blood cell count, unspecified: Secondary | ICD-10-CM

## 2013-11-10 DIAGNOSIS — R109 Unspecified abdominal pain: Secondary | ICD-10-CM

## 2013-11-10 HISTORY — DX: Family history of other specified conditions: Z84.89

## 2013-11-10 HISTORY — DX: Other complications of anesthesia, initial encounter: T88.59XA

## 2013-11-10 HISTORY — DX: Adverse effect of unspecified anesthetic, initial encounter: T41.45XA

## 2013-11-10 HISTORY — DX: Anemia, unspecified: D64.9

## 2013-11-10 HISTORY — DX: Headache: R51

## 2013-11-10 HISTORY — DX: Abscess of intestine: K63.0

## 2013-11-10 LAB — URINALYSIS, ROUTINE W REFLEX MICROSCOPIC
Glucose, UA: NEGATIVE mg/dL
Protein, ur: NEGATIVE mg/dL
Specific Gravity, Urine: 1.025 (ref 1.005–1.030)

## 2013-11-10 LAB — CBC WITH DIFFERENTIAL/PLATELET
Basophils Absolute: 0 10*3/uL (ref 0.0–0.1)
Basophils Relative: 0 % (ref 0–1)
Eosinophils Absolute: 0.1 10*3/uL (ref 0.0–0.7)
Eosinophils Relative: 0 % (ref 0–5)
HCT: 34.8 % — ABNORMAL LOW (ref 36.0–46.0)
MCH: 28.1 pg (ref 26.0–34.0)
MCHC: 32.8 g/dL (ref 30.0–36.0)
MCV: 85.9 fL (ref 78.0–100.0)
Monocytes Absolute: 1.6 10*3/uL — ABNORMAL HIGH (ref 0.1–1.0)
Platelets: 487 10*3/uL — ABNORMAL HIGH (ref 150–400)
RDW: 14.6 % (ref 11.5–15.5)

## 2013-11-10 LAB — COMPREHENSIVE METABOLIC PANEL
AST: 8 U/L (ref 0–37)
Albumin: 3.2 g/dL — ABNORMAL LOW (ref 3.5–5.2)
CO2: 28 mEq/L (ref 19–32)
Calcium: 9 mg/dL (ref 8.4–10.5)
Creatinine, Ser: 0.72 mg/dL (ref 0.50–1.10)
GFR calc non Af Amer: >90 mL/min (ref >90)
Total Protein: 7.8 g/dL (ref 6.0–8.3)

## 2013-11-10 LAB — POCT PREGNANCY, URINE: Preg Test, Ur: NEGATIVE

## 2013-11-10 LAB — URINE MICROSCOPIC-ADD ON

## 2013-11-10 LAB — OCCULT BLOOD, POC DEVICE: Fecal Occult Bld: NEGATIVE

## 2013-11-10 MED ORDER — MESALAMINE ER 250 MG PO CPCR
1000.0000 mg | ORAL_CAPSULE | Freq: Three times a day (TID) | ORAL | Status: DC
  Administered 2013-11-11: 1000 mg via ORAL
  Filled 2013-11-10 (×2): qty 4

## 2013-11-10 MED ORDER — IOHEXOL 300 MG/ML  SOLN
25.0000 mL | INTRAMUSCULAR | Status: DC
  Administered 2013-11-10: 25 mL via ORAL

## 2013-11-10 MED ORDER — SODIUM CHLORIDE 0.9 % IV BOLUS (SEPSIS)
1000.0000 mL | Freq: Once | INTRAVENOUS | Status: AC
  Administered 2013-11-10: 1000 mL via INTRAVENOUS

## 2013-11-10 MED ORDER — MORPHINE SULFATE 4 MG/ML IJ SOLN
INTRAMUSCULAR | Status: AC
  Filled 2013-11-10: qty 1

## 2013-11-10 MED ORDER — MORPHINE SULFATE 4 MG/ML IJ SOLN
4.0000 mg | INTRAMUSCULAR | Status: DC | PRN
  Administered 2013-11-10 – 2013-11-13 (×6): 4 mg via INTRAVENOUS
  Filled 2013-11-10 (×5): qty 1

## 2013-11-10 MED ORDER — POLYETHYLENE GLYCOL 3350 17 G PO PACK: 17.0000 g | PACK | Freq: Every day | ORAL | Status: DC | PRN

## 2013-11-10 MED ORDER — INFLUENZA VAC SPLIT QUAD 0.5 ML IM SUSP
0.5000 mL | INTRAMUSCULAR | Status: AC
  Administered 2013-11-11: 0.5 mL via INTRAMUSCULAR
  Filled 2013-11-10: qty 0.5

## 2013-11-10 MED ORDER — MORPHINE SULFATE 4 MG/ML IJ SOLN
4.0000 mg | Freq: Once | INTRAMUSCULAR | Status: AC
  Administered 2013-11-10: 4 mg via INTRAVENOUS
  Filled 2013-11-10: qty 1

## 2013-11-10 MED ORDER — METRONIDAZOLE IN NACL 5-0.79 MG/ML-% IV SOLN
500.0000 mg | Freq: Once | INTRAVENOUS | Status: AC
  Administered 2013-11-10: 500 mg via INTRAVENOUS
  Filled 2013-11-10: qty 100

## 2013-11-10 MED ORDER — IOHEXOL 300 MG/ML  SOLN
100.0000 mL | Freq: Once | INTRAMUSCULAR | Status: AC | PRN
  Administered 2013-11-10: 100 mL via INTRAVENOUS

## 2013-11-10 MED ORDER — HEPARIN SODIUM (PORCINE) 5000 UNIT/ML IJ SOLN
5000.0000 [IU] | Freq: Three times a day (TID) | INTRAMUSCULAR | Status: DC
  Filled 2013-11-10 (×22): qty 1

## 2013-11-10 MED ORDER — SODIUM CHLORIDE 0.9 % IV SOLN
INTRAVENOUS | Status: AC
  Administered 2013-11-10 – 2013-11-11 (×2): via INTRAVENOUS

## 2013-11-10 MED ORDER — ONDANSETRON HCL 4 MG/2ML IJ SOLN
4.0000 mg | Freq: Once | INTRAMUSCULAR | Status: AC
  Administered 2013-11-10: 4 mg via INTRAVENOUS
  Filled 2013-11-10: qty 2

## 2013-11-10 MED ORDER — MESALAMINE ER 250 MG PO CPCR: 1000.0000 mg | ORAL_CAPSULE | Freq: Two times a day (BID) | ORAL | Status: DC

## 2013-11-10 MED ORDER — MESALAMINE ER 250 MG PO CPCR
1000.0000 mg | ORAL_CAPSULE | Freq: Four times a day (QID) | ORAL | Status: DC
  Filled 2013-11-10: qty 4

## 2013-11-10 MED ORDER — CIPROFLOXACIN IN D5W 400 MG/200ML IV SOLN
400.0000 mg | Freq: Once | INTRAVENOUS | Status: AC
  Administered 2013-11-10: 400 mg via INTRAVENOUS
  Filled 2013-11-10: qty 200

## 2013-11-10 MED ORDER — CIPROFLOXACIN HCL 500 MG PO TABS
500.0000 mg | ORAL_TABLET | Freq: Two times a day (BID) | ORAL | Status: DC
  Administered 2013-11-11: 500 mg via ORAL
  Filled 2013-11-10 (×3): qty 1

## 2013-11-10 MED ORDER — PREDNISONE 20 MG PO TABS
40.0000 mg | ORAL_TABLET | Freq: Every day | ORAL | Status: DC
  Administered 2013-11-11 – 2013-11-17 (×7): 40 mg via ORAL
  Filled 2013-11-10 (×14): qty 2

## 2013-11-10 MED ORDER — ONDANSETRON HCL 4 MG/2ML IJ SOLN: 4.0000 mg | Freq: Three times a day (TID) | INTRAMUSCULAR | Status: AC | PRN

## 2013-11-10 MED ORDER — METRONIDAZOLE 500 MG PO TABS
500.0000 mg | ORAL_TABLET | Freq: Two times a day (BID) | ORAL | Status: DC
  Administered 2013-11-11: 500 mg via ORAL
  Filled 2013-11-10 (×3): qty 1

## 2013-11-10 NOTE — ED Notes (Signed)
Pt instructed to call out when she completes contrast.

## 2013-11-10 NOTE — ED Provider Notes (Signed)
CSN: [id number]     Arrival date & time 11/10/13  1252 History   First MD Initiated Contact with Patient 11/10/13 1655     Chief Complaint  Patient presents with  . Abdominal Pain   (Consider location/radiation/quality/duration/timing/severity/associated sxs/prior Treatment) HPI Gina Gonzalez is a 32 y.o. female with history of crohn's disease who presents to ER with complaint of right lower quadrant abdominal pain. States pain began yesterday. States it is worse than she has ever had before. States was admitted with similar pain a year ago, states was colitis flare, but reports this pain being different. Pain worsened with movement and walking. Denies fever, admits to chills. Denis prior abdominal surgeries. States has had associated nausea and vomiting. Denies diarrhea or blood in stool. No urinary symptoms, no vagina complaints. Not pregnant. Currently taking prednisone for her crohns, unable to afford mesalamine which was prescribed to her by her doctor.   Past Medical History  Diagnosis Date  . Crohn disease    Past Surgical History  Procedure Laterality Date  . Tonsillectomy     Family History  Problem Relation Age of Onset  . Thyroid disease Mother   . Cancer Maternal Grandmother    History  Substance Use Topics  . Smoking status: Never Smoker   . Smokeless tobacco: Not on file  . Alcohol Use: No   OB History   Grav Para Term Preterm Abortions TAB SAB Ect Mult Living                 Review of Systems  Constitutional: Negative for fever and chills.  Respiratory: Negative for cough, chest tightness and shortness of breath.   Cardiovascular: Negative for chest pain, palpitations and leg swelling.  Gastrointestinal: Positive for nausea, vomiting and abdominal pain. Negative for diarrhea and blood in stool.  Genitourinary: Negative for dysuria, flank pain, vaginal bleeding, vaginal discharge, vaginal pain and pelvic pain.  Musculoskeletal: Negative for arthralgias,  myalgias, neck pain and neck stiffness.  Skin: Negative for rash.  Neurological: Negative for dizziness, weakness and headaches.  All other systems reviewed and are negative.    Allergies  Review of patient's allergies indicates no known allergies.  Home Medications   Current Outpatient Rx  Name  Route  Sig  Dispense  Refill  . Aspirin-Acetaminophen-Caffeine (PAMPRIN MAX PO)   Oral   Take 1 tablet by mouth daily as needed. For pain         . HYDROcodone-acetaminophen (NORCO) 10-325 MG per tablet   Oral   Take 1 tablet by mouth every 8 (eight) hours as needed.   45 tablet   0   . ibuprofen (ADVIL,MOTRIN) 200 MG tablet   Oral   Take 600 mg by mouth every 6 (six) hours as needed. For pain         . mesalamine (PENTASA) 500 MG CR capsule   Oral   Take 2 capsules (1,000 mg total) by mouth 4 (four) times daily.   168 capsule   0     Please disregard previous Rx for mesalamine, needs ...   . predniSONE (DELTASONE) 20 MG tablet   Oral   Take 2 tablets (40 mg total) by mouth daily. Reduce dose after week one as directed.   42 tablet   0   . promethazine (PHENERGAN) 12.5 MG tablet   Oral   Take 1 tablet (12.5 mg total) by mouth every 8 (eight) hours as needed for nausea or vomiting.   30 tablet  0   . Simethicone (ANTI GAS PO)   Oral   Take 1 capsule by mouth daily.          BP 106/51  Pulse 79  Temp(Src) 98.6 F (37 C) (Oral)  Resp 18  Wt 239 lb 11.2 oz (108.727 kg)  SpO2 95%  LMP 09/26/2013 Physical Exam  Nursing note and vitals reviewed. Constitutional: She appears well-developed and well-nourished. No distress.  HENT:  Head: Normocephalic.  Eyes: Conjunctivae are normal.  Neck: Neck supple.  Cardiovascular: Normal rate, regular rhythm and normal heart sounds.   Pulmonary/Chest: Effort normal and breath sounds normal. No respiratory distress. She has no wheezes. She has no rales.  Abdominal: Soft. Bowel sounds are normal. She exhibits no  distension. There is tenderness. There is no rebound.  RLQ tenderness. No guarding or rebound tenderness.  Musculoskeletal: She exhibits no edema.  Neurological: She is alert.  Skin: Skin is warm and dry.  Psychiatric: She has a normal mood and affect. Her behavior is normal.    ED Course  Procedures (including critical care time) Labs Review Labs Reviewed  CBC WITH DIFFERENTIAL - Abnormal; Notable for the following:    WBC 20.5 (*)    Hemoglobin 11.4 (*)    HCT 34.8 (*)    Platelets 487 (*)    Neutro Abs 15.7 (*)    Monocytes Absolute 1.6 (*)    All other components within normal limits  COMPREHENSIVE METABOLIC PANEL - Abnormal; Notable for the following:    Sodium 135 (*)    Potassium 3.5 (*)    Chloride 92 (*)    Albumin 3.2 (*)    All other components within normal limits  URINALYSIS, ROUTINE W REFLEX MICROSCOPIC - Abnormal; Notable for the following:    Color, Urine AMBER (*)    Bilirubin Urine SMALL (*)    Ketones, ur 15 (*)    Leukocytes, UA SMALL (*)    All other components within normal limits  URINE MICROSCOPIC-ADD ON - Abnormal; Notable for the following:    Bacteria, UA MANY (*)    All other components within normal limits  LIPASE, BLOOD  POCT PREGNANCY, URINE   Imaging Review Ct Abdomen Pelvis W Contrast  11/10/2013   CLINICAL DATA:  Patient complains of abdominal pain with nausea and vomiting for 2 days, with history of Crohn's disease, pain primarily right lower quadrant  EXAM: CT ABDOMEN AND PELVIS WITH CONTRAST  TECHNIQUE: Multidetector CT imaging of the abdomen and pelvis was performed using the standard protocol following bolus administration of intravenous contrast.  CONTRAST:  OMNIPAQUE IOHEXOL 300 MG/ML  SOLN  COMPARISON:  Multiple prior studies the most recent of which is 05/30/13  FINDINGS: The visualized portions of the lung bases are clear.  The liver, gallbladder, and spleen are normal except for a 3 mm subcapsular low-attenuation lesion in  the anterior superior spleen likely not of clinical consequence. The pancreas, kidneys, and adrenal glands are normal. Bladder is normal. Uterus appears normal. Right ovary is normal. Left ovary demonstrates a 20 x 28 mm low-attenuation oval lesions likely a cyst. There is free fluid in the cul-de-sac.  The terminal ileum shows severe wall thickening. There is an intervening section of relatively normal ileum, with more proximal ileum also showing severe wall thickening she entire a mass of inflamed small bowel measures approximately 13 x 9 cm in cross-section on image number 56. There are several fluid collections associated with this area of abnormal bowel. One such  collection measures 33 x 50 mm and is seen at in the midline over the lumbar spine. This is seen best on image number 61. Another collection measures about 1.5 cm and is seen in the anterior right aspect of the inflamed area. This is seen best on image number 58. Of 3rd fluid collection measures about 2 cm, and is located on image number 53 anterior to the right psoas muscle. It appears to contain a few foci of gas within it. Within this area of inflamed matted bowel, there are numerous enlarged lymph nodes. There are also tract of attenuation that would suggest developing fistula, as seen on image number 61 to the left of the conglomerated inflammatory process.  Bowel gas pattern is nonobstructive. There are no acute musculoskeletal findings. There is moderate sclerotic change involving both sacroiliac joints.  IMPRESSION: 1. Findings again consistent with provided history of Crohn's disease, with extensive distal small bowel inflammatory change. Most of the ileum is inflamed with matted loops of bowel and multiple interloop abscesses. There is adenopathy and there is concern for developing fistula. 2. Findings consistent with left ovarian ruptured hemorrhagic cyst 3. Sacroiliitis associated with inflammatory bowel disease.   Electronically Signed    By: Esperanza Heir M.D.   On: 11/10/2013 19:42    EKG Interpretation   None       MDM   1. Crohn's disease of colon with abscess     Patient with right lower quadrant abdominal pain history of Crohn's disease. She's afebrile emergency department. There is no guarding on exam. Differential includes Crohn's disease colitis and possible complications versus appendicitis. There is no adnexal tenderness at this time. She has no vaginal urinary symptoms. Will get labs and CT of the abdomen and pelvis.    8:29 PM Patient's abdominal CT as described above. There is extensive distal small bowel inflammatory change with possible interloop abscess ceased. Also concerning for developing fistula. I started patient on Cipro and Flagyl IV. She is currently n.p.o. Pain is under control with 4 mg of morphine. Nausea improved with 4 mg of Zofran. I spoken with her primary care Dr., family practice, they will admit patient.   Filed Vitals:   11/10/13 1301 11/10/13 1556 11/10/13 1727 11/10/13 1730  BP: 103/69 106/51 108/56 123/62  Pulse: 142 79 113 105  Temp: 98 F (36.7 C) 98.6 F (37 C)    TempSrc: Oral Oral    Resp: 18 18    Weight: 239 lb 11.2 oz (108.727 kg)     SpO2: 96% 95% 100% 99%       Lottie Mussel, PA-C 11/10/13 2032

## 2013-11-10 NOTE — ED Notes (Signed)
Pt in c/o abd pain with n/v since Sunday, states she has a history of crohns disease, but states this pain is worse than normal, c/o pain to RLQ

## 2013-11-10 NOTE — ED Notes (Signed)
Pt states she feels like she has some indigestion.

## 2013-11-10 NOTE — ED Notes (Signed)
Internal medicine at bedside

## 2013-11-10 NOTE — ED Provider Notes (Signed)
Medical screening examination/treatment/procedure(s) were performed by non-physician practitioner and as supervising physician I was immediately available for consultation/collaboration.  Flint Melter, MD 11/10/13 [phone]

## 2013-11-10 NOTE — ED Notes (Addendum)
Pt c/o RLQ pain that radiates to back, and n/v since Sunday. Pt states when she bends or anything she feels the pain. Pt described pain as "charlie horse" pt states this does not feel like a crohn's flair up, she states she normally can take pain meds and the pain will go away. Pt states she took some hydrocodone this morning around 7am. Pt states she was prescribed zofran and prednisone yesterday by Dr. Ermalinda Memos for sx but she has been unable to pick up prescription.

## 2013-11-10 NOTE — ED Notes (Signed)
Pt returned from CT °

## 2013-11-10 NOTE — H&P (Signed)
Family Medicine Teaching [facility] Admission History and Physical Service Pager: [phone]  Patient name: Gina Gonzalez Medical record number: [id number] Date of birth: 1981/05/26 Age: 32 y.o. Gender: female  Primary Care Provider: Kevin Fenton, MD Consultants: None Code Status: FULL  Chief Complaint: Severe abdominal pain  Assessment and Plan: Aolani Walstad is a 32 y.o. female presenting with likely Crohn's Disease flare with ileitis seen on CT scan. PMH is significant for Crohn's Disease diagnosed ~1.5 years ago with 1 prior hospitalization Sept 2013 with perforated small bowel.  # Crohn's Ileitis - H/o Crohn's Disease with ileitis seen on CT scan and reported hx of worsening episodic abdominal pain that became constant 1 day ago; also concern for abscesses and fistula on CT, but nonobstructive bowel gas pattern. FOBT negative. Pt stable but in considerable pain. Sacroiliitis also seen. - Will admit to [facility] inpatient, attending Dr. Lum Babe. - Will continue prednisone 40mg  PO daily. - For ileitis, ciprofloxacin and flagyl PO. - Morphine IV prn, zofran IV prn - IV fluids, clear liquid diet. - Discussed with GI tonight given severe abdominal pain and abscesses and fistula on CT scan with h/o small bowel perforation - pt has also been unable to f/u as outpatient with Dr Madilyn Fireman or get colonoscopy. Spoke with Dr Elnoria Howard (unassigned GI since she is in collections w/Dr Madilyn Fireman' office, per their on-call MD) who recommends NOT starting mesalamine, consulting surgery in AM for eval if need to drain abscesses, and raises possibility of needing IR to drain these. They will officially consult in AM. - BMET in AM to follow very mild hypokalemia, likely from emesis.  # Leukocytosis - Likely secondary to prednisone use vs active Crohn's ileitis. - Recheck CBC in AM. - Monitor for fevers, currently afebrile. - Started ciprofloxacin and flagyl PO 12/30.  # UA with many bacteria - Appears dehydrated  with 15 ketones and small bili, also however has many bacteria and small leukocytes, rare squamous cells, nitrite neg - Given pt is asymptomatic with no dysuria, back pain, or suprapubic tenderness, will hold off on abx at this time and monitor.  # Social - Pt reports difficulty affording mesalamine. Has also not seen GI as outpatient due to concern about cost and no insurance, per PCP notes. - Consulted SW to help with insurance options and med affordability, which is even difficult after trying discount coupon for pentASA 50% off.  FEN/GI: Clear liquid diet, transition to NPO if increases pain; NS at 125cc/hr x 12 hours - Miralax PRN  Prophylaxis: SQ heparin  Disposition: Admit to inpatient, discharge pending improvement in symptoms and GI recommendations.  History of Present Illness: Gina Gonzalez is a 32 y.o. female presenting with severe abdominal pain with h/o Crohn's Disease. She reports she has had nightly intermittent abdominal pain since 12/19. Four nights ago she developed severe abdominal pain that lasted a few hours and went away with hydrocodone. Yesterday, she developed abdominal pain that felt crampy like a "charlie horse" with stomach knotting. Pain was so severe she took  to get out of bed, as pain was worse when she bent at the hips. She has had night sweats, felt warm, emesis 6 times yesterday with every attempt to eat (none today), constant nausea today, and feeling dehydrated now. She felt dizzy earlier today but did not faint. She denies rash, diarrhea, chills, rectal bleeding (has had occasionally from hemorrhoids), syncope, rectal pain, rectal fistula, and sick contacts. She thinks she has lost about 4 lbs since the 19th.  She was diagnosed 1.5 years ago with Crohn's Disease. She was admitted Sep 2013 with flare and small bowel perforation. She started prednisone July 2014 and abx Oct 2014. She has been decreasing and increasing prednisone since then, and her last  dose was 30mg  2 days ago (none since due to nausea). PCP prescribed mesalamine but patient has been unable to purchase this due to cost ($539 at [facility]).   In the ED, she received zofran, 4mg  morphine (5:30PM), flagyl, ciprofloxacin, and 2L bolus. Pain initially relieved but is returning. CT abdomien shows inflamed ileum with abscesses and ?fistula.  Review Of Systems: Per HPI. Otherwise 12 point review of systems was performed and was unremarkable.  Patient Active Problem List   Diagnosis Date Noted  . Crohn's colitis 11/10/2013  . Crohn's disease of ileum 10/23/2013  . Abdominal  pain, other specified site 10/23/2013  . Crohn's disease 08/15/2012  . Anemia 08/15/2012  . Abdominal pain, acute, bilateral lower quadrant 07/30/2012  . Fever 07/30/2012  . Perforated small intestine 07/29/2012   Past Medical History: Past Medical History  Diagnosis Date  . Crohn disease    Past Surgical History: Past Surgical History  Procedure Laterality Date  . Tonsillectomy    Tubes in ears.  Social History: History  Substance Use Topics  . Smoking status: Never Smoker   . Smokeless tobacco: Not on file  . Alcohol Use: No   Additional social history: Rarely uses alcohol. Lives with mom and brother.  Please also refer to relevant sections of EMR.  Family History: Family History  Problem Relation Age of Onset  . Thyroid disease (Grave's disease) Mother   . Cancer Lung, passed Maternal Grandmother   Aunt has colitis on father's side. Mother has IBS. Paternal GM has stomach issues. Uncles with heart conditions. Mat uncle passed away with CHF 2-3 years ago.  Allergies and Medications: Allergies  Allergen Reactions  . Adhesive [Tape] Other (See Comments)    redness   No current facility-administered medications on file prior to encounter.   Current Outpatient Prescriptions on File Prior to Encounter  Medication Sig Dispense Refill  . ibuprofen (ADVIL,MOTRIN) 200 MG tablet  Take 800-1,000 mg by mouth 2 (two) times daily as needed (daily). For pain      . mesalamine (PENTASA) 500 MG CR capsule Take 2 capsules (1,000 mg total) by mouth 4 (four) times daily.  168 capsule  0  . promethazine (PHENERGAN) 12.5 MG tablet Take 1 tablet (12.5 mg total) by mouth every 8 (eight) hours as needed for nausea or vomiting.  30 tablet  0  Has not started mesalamine. Prednisone daily with breakfast 20-40mg  daily.  Objective: BP 123/62  Pulse 105  Temp(Src) 98.6 F (37 C) (Oral)  Resp 18  Wt 239 lb 11.2 oz (108.727 kg)  SpO2 99%  LMP 09/26/2013 Exam: General: NAD, pleasant, occasionally winces in pain when moves HEENT: AT/[place], sclera clear, EOMI, PERRL, o/p with mild erythema back of throat and hard palate with mild erythema and exudate Lymph: No palpable PAD Neck: Supple Cardiovascular: RRR, no m/r/g Respiratory: CTAB, normal effort, no wheezes or crackles Abdomen: Soft, tender generally but worst in RLQ, mild distension, no rebound, no guarding; NABS Extremities: No LE edema or calf tenderness Skin: No rash or cyanosis Neuro: Awake, alert, grossly nonfocal, normal speech and tone GI: DRE with palpable hemorrhoids, no active bleeding, external hemorrhoid seen, no tenderness, no fistula seen  Labs and Imaging: CBC BMET   Recent Labs Lab 11/10/13 1710  WBC 20.5*  HGB 11.4*  HCT 34.8*  PLT 487*    Recent Labs Lab 11/10/13 1710  NA 135*  K 3.5*  CL 92*  CO2 28  BUN 10  CREATININE 0.72  GLUCOSE 92  CALCIUM 9.0     CT abdomen and pelvis: IMPRESSION:  1. Findings again consistent with provided history of Crohn's  disease, with extensive distal small bowel inflammatory change. Most  of the ileum is inflamed with matted loops of bowel and multiple  interloop abscesses. There is adenopathy and there is concern for  developing fistula.  2. Findings consistent with left ovarian ruptured hemorrhagic cyst  3. Sacroiliitis associated with inflammatory bowel  disease.  UPT neg  Urinalysis    Component Value Date/Time   COLORURINE AMBER* 11/10/2013 1730   APPEARANCEUR CLEAR 11/10/2013 1730   LABSPEC 1.025 11/10/2013 1730   PHURINE 7.0 11/10/2013 1730   GLUCOSEU NEGATIVE 11/10/2013 1730   HGBUR NEGATIVE 11/10/2013 1730   BILIRUBINUR SMALL* 11/10/2013 1730   KETONESUR 15* 11/10/2013 1730   PROTEINUR NEGATIVE 11/10/2013 1730   UROBILINOGEN 1.0 11/10/2013 1730   NITRITE NEGATIVE 11/10/2013 1730   LEUKOCYTESUR SMALL* 11/10/2013 1730       Leona Singleton, MD 11/10/2013, 8:14 PM PGY-2, [facility] FPTS Intern pager: [phone], text pages welcome

## 2013-11-11 ENCOUNTER — Encounter (HOSPITAL_COMMUNITY): Payer: Self-pay | Admitting: General Surgery

## 2013-11-11 DIAGNOSIS — R1032 Left lower quadrant pain: Secondary | ICD-10-CM

## 2013-11-11 DIAGNOSIS — K5090 Crohn's disease, unspecified, without complications: Secondary | ICD-10-CM

## 2013-11-11 DIAGNOSIS — R1031 Right lower quadrant pain: Secondary | ICD-10-CM

## 2013-11-11 DIAGNOSIS — R52 Pain, unspecified: Secondary | ICD-10-CM

## 2013-11-11 DIAGNOSIS — D649 Anemia, unspecified: Secondary | ICD-10-CM

## 2013-11-11 DIAGNOSIS — R509 Fever, unspecified: Secondary | ICD-10-CM

## 2013-11-11 LAB — CBC
HCT: 32.8 % — ABNORMAL LOW (ref 36.0–46.0)
Hemoglobin: 10.3 g/dL — ABNORMAL LOW (ref 12.0–15.0)
MCH: 27.2 pg (ref 26.0–34.0)
MCHC: 31.4 g/dL (ref 30.0–36.0)
MCV: 86.8 fL (ref 78.0–100.0)
Platelets: 432 10*3/uL — ABNORMAL HIGH (ref 150–400)

## 2013-11-11 LAB — BASIC METABOLIC PANEL
BUN: 6 mg/dL (ref 6–23)
CO2: 25 mEq/L (ref 19–32)
Chloride: 98 mEq/L (ref 96–112)
Glucose, Bld: 123 mg/dL — ABNORMAL HIGH (ref 70–99)
Potassium: 4.1 mEq/L (ref 3.7–5.3)

## 2013-11-11 MED ORDER — CHLORHEXIDINE GLUCONATE 0.12 % MT SOLN
15.0000 mL | Freq: Two times a day (BID) | OROMUCOSAL | Status: DC
  Administered 2013-11-11 – 2013-11-12 (×4): 15 mL via OROMUCOSAL
  Filled 2013-11-11 (×3): qty 15

## 2013-11-11 MED ORDER — PIPERACILLIN-TAZOBACTAM 3.375 G IVPB
3.3750 g | Freq: Three times a day (TID) | INTRAVENOUS | Status: DC
  Administered 2013-11-11 – 2013-11-17 (×18): 3.375 g via INTRAVENOUS
  Filled 2013-11-11 (×21): qty 50

## 2013-11-11 MED ORDER — BIOTENE DRY MOUTH MT LIQD
15.0000 mL | Freq: Two times a day (BID) | OROMUCOSAL | Status: DC
  Administered 2013-11-11 – 2013-11-12 (×4): 15 mL via OROMUCOSAL

## 2013-11-11 NOTE — Progress Notes (Signed)
Family Medicine Teaching Service Daily Progress Note Intern Pager: [phone]  Patient name: Gina Gonzalez Medical record number: [id number] Date of birth: Jan 22, 1981 Age: 32 y.o. Gender: female  Primary Care Provider: Kevin Fenton, MD Consultants: GI, surgery Code Status: FULL  Pt Overview and Major Events to Date:  12/30: Admitted due to Crohn's Ileitis with abscesses and ?fistula in ileus, GI made aware  Assessment and Plan:  Gina Gonzalez is a 32 y.o. female presenting with likely Crohn's Disease flare with ileitis seen on CT scan. PMH is significant for Crohn's Disease diagnosed ~1.5 years ago with 1 prior hospitalization Sept 2013 with perforated small bowel.   # Crohn's Ileitis - H/o Crohn's Disease with ileitis seen on CT scan and reported hx of worsening episodic abdominal pain that became constant 12/29; also concern for abscesses and fistula on CT, but nonobstructive bowel gas pattern. FOBT negative. Pt stable but in considerable pain. Sacroiliitis also seen.  - Will continue prednisone 40mg  PO daily. Monitor for hyperglycemia. - For ileitis, ciprofloxacin and flagyl PO.  - Morphine IV prn, zofran IV prn  - IV fluids, clear liquid diet.  - Per GI discussion over phone, did NOT start mesalamine. They are planning to see today - f/u recs.  - Will consult surgery per GI recs given abscesses. - Hypokalemia likely from emesis, improved.  # Leukocytosis - Likely secondary to prednisone use vs active Crohn's ileitis.  - Improving this morning. Recheck in AM. - Monitor for fevers, currently afebrile.  - Started ciprofloxacin and flagyl PO 12/30.   # UA with many bacteria - Appears dehydrated with 15 ketones and small bili, also however has many bacteria and small leukocytes, rare squamous cells, nitrite neg  - Given pt is asymptomatic with no dysuria, back pain, or suprapubic tenderness, will hold off on abx at this time and monitor.   # Anemia - Likely hemodilutional after  hemoconcentration with vomiting. Asymptomatic. - Monitor.  # Social - Pt reports difficulty affording mesalamine. Has also not seen GI as outpatient due to concern about cost and no insurance, per PCP notes.  - Consulted SW to help with insurance options and med affordability, which is even difficult after trying discount coupon for pentASA 50% off.   FEN/GI: Tolerating clear liquid diet, transition to NPO if increases pain; NS at 125cc/hr x 12 hours - okay to let expire as pt is maintaining PO. - Miralax PRN  Prophylaxis: SQ heparin   Disposition: Discharge pending improvement in symptoms and GI/surg recommendations.  Subjective: Patient feels much better today and last morphine use was 3AM; total used 4x since arrival to ED. Pain is 2/10 today, was high yesterday. Had BM yesterday.   Objective: Temp:  [97.8 F (36.6 C)-98.6 F (37 C)] 97.8 F (36.6 C) (12/31 0534) Pulse Rate:  [79-142] 83 (12/31 0534) Resp:  [18-20] 20 (12/31 0534) BP: (103-127)/(49-69) 120/68 mmHg (12/31 0534) SpO2:  [95 %-100 %] 100 % (12/31 0534) Weight:  [239 lb 11.2 oz (108.727 kg)-242 lb 8.1 oz (110 kg)] 242 lb 8.1 oz (110 kg) (12/30 2157) Physical Exam: General: NAD, pleasant, ambulating Cardiovascular: RRR, no m/r/g Respiratory: CTAB, no wheezes or crackles Abdomen: Soft, tender in RLQ mild, NABS, nondistended, tenderness markedly decreased from yesterday's exam Extremities: No rash or cyanosis, no edema or calf tenderness  Laboratory:  Recent Labs Lab 11/10/13 1710  WBC 20.5*  HGB 11.4*  HCT 34.8*  PLT 487*    Recent Labs Lab 11/10/13 1710  NA 135*  K  3.5*  CL 92*  CO2 28  BUN 10  CREATININE 0.72  CALCIUM 9.0  PROT 7.8  BILITOT 0.6  ALKPHOS 89  ALT 14  AST 8  GLUCOSE 92    Gina Singleton, MD 11/11/2013, 6:35 AM PGY-2, [facility] FPTS Intern pager: [phone], text pages welcome

## 2013-11-11 NOTE — Progress Notes (Signed)
Addendum to daily note:  Spoke with surgery PA Barnetta Chapel who recommended zosyn in stead of PO cipro and flagyl for better penetration given multiple intra-abdominal abscesses. Discontinued PO abx and ordered zosyn per pharmacy. Would at least treat for 1-2 days with IV abx.  Leona Singleton, MD

## 2013-11-11 NOTE — Progress Notes (Signed)
ANTIBIOTIC CONSULT NOTE - INITIAL  Pharmacy Consult for Zosyn Indication: Intestinal abscess  Allergies  Allergen Reactions  . Adhesive [Tape] Other (See Comments)    redness    Patient Measurements: Height: 5' 7.5" (171.5 cm) Weight: 242 lb 8.1 oz (110 kg) IBW/kg (Calculated) : 62.75 Adjusted Body Weight:    Vital Signs: Temp: 97.8 F (36.6 C) (12/31 0534) Temp src: Oral (12/31 0534) BP: 120/68 mmHg (12/31 0534) Pulse Rate: 83 (12/31 0534) Intake/Output from previous day: 12/30 0701 - 12/31 0700 In: 240 [P.O.:240] Out: -  Intake/Output from this shift: Total I/O In: 240 [P.O.:240] Out: -   Labs:  Recent Labs  11/10/13 1710 11/11/13 0717  WBC 20.5* 17.6*  HGB 11.4* 10.3*  PLT 487* 432*  CREATININE 0.72 0.63   Estimated Creatinine Clearance: 131.4 ml/min (by C-G formula based on Cr of 0.63). No results found for this basename: VANCOTROUGH, VANCOPEAK, VANCORANDOM, GENTTROUGH, GENTPEAK, GENTRANDOM, TOBRATROUGH, TOBRAPEAK, TOBRARND, AMIKACINPEAK, AMIKACINTROU, AMIKACIN,  in the last 72 hours   Microbiology: No results found for this or any previous visit (from the past 720 hour(s)).  Medical History: Past Medical History  Diagnosis Date  . Crohn disease   . Complication of anesthesia     "took a little while for me to come out of it"  . Family history of anesthesia complication     "takes Mom a little while to come out" (11/10/2013)  . Anemia 2013  . RUEAVWUJ(811.9)     "couple times/month" (11/10/2013)    Medications:  Prescriptions prior to admission  Medication Sig Dispense Refill  . Alpha-D-Galactosidase (BEANO PO) Take 1 tablet by mouth 2 (two) times daily as needed (gas).      Marland Kitchen HYDROcodone-acetaminophen (NORCO) 10-325 MG per tablet Take 0.5-1 tablets by mouth 4 (four) times daily as needed (pain).      Marland Kitchen ibuprofen (ADVIL,MOTRIN) 200 MG tablet Take 800-1,000 mg by mouth 2 (two) times daily as needed (daily). For pain      . predniSONE (DELTASONE)  20 MG tablet Take 20-40 mg by mouth daily with breakfast. As directed by md      . mesalamine (PENTASA) 500 MG CR capsule Take 2 capsules (1,000 mg total) by mouth 4 (four) times daily.  168 capsule  0  . promethazine (PHENERGAN) 12.5 MG tablet Take 1 tablet (12.5 mg total) by mouth every 8 (eight) hours as needed for nausea or vomiting.  30 tablet  0   Assessment: Severe abdominal pain CT= likely Crohn's Disease flare with ileitis. Concern for abscesses and fistula. Plan to consult surgery to assess need to drain abscesses.  Afebrile. WBC 17.6. Scr 0.63 with estimated CrCl >100  Plan:  Zosyn 3.375g IV q8hr (dose ok down to CrCl 10) Pharmacy will sign off due to no further dosage adjustment needed.   Kaiyah Eber S. Merilynn Finland, PharmD, BCPS Clinical Staff Pharmacist Pager [phone]  Misty Stanley Stillinger 11/11/2013,11:29 AM

## 2013-11-11 NOTE — Consult Note (Signed)
Gina Gonzalez [birth year]  [id number].   Primary Care MD: Dr. Ermalinda Memos Requesting MD: Dr. Janit Pagan Chief Complaint/Reason for Consult: crohn's disease with interloop abscesses HPI: This is a 32 yo female who has presumed crohn's disease based off oc CT scans.  She has never had a tissue biopsy confirmation.  Due to monetary issues, she has not followed up for a colonoscopy or GI management.  She has been seen and treated with prednisone by her PCP for the last year.  She began having new RLQ abdominal pain on Monday that she stated was different from her normal crohn's type pain.  She admits to night sweats and possible subjective fevers.  Some nausea on Monday and an inability to take in more than clear liquids.  She decided to come to the [facility] yesterday for continued pain.  She had a CT scan that revealed "Findings again consistent with provided history of Crohn's disease, with extensive distal small bowel inflammatory change. Most  of the ileum is inflamed with matted loops of bowel and multiple  interloop abscesses. There is adenopathy and there is concern for  developing fistula."  She was admitted and we have been asked to see her for further recommendations.     ROS: Please see HPI, otherwise all other systems have been reviewed and are negative.  Family History  Problem Relation Age of Onset  . Thyroid disease Mother   . Cancer Maternal Grandmother     Past Medical History  Diagnosis Date  . Crohn disease   . Complication of anesthesia     "took a little while for me to come out of it"  . Family history of anesthesia complication     "takes Mom a little while to come out" (11/10/2013)  . Anemia 2013  . VHQIONGE(952.8)     "couple times/month" (11/10/2013)    Past Surgical History  Procedure Laterality Date  . Tonsilectomy, adenoidectomy, bilateral myringotomy and tubes Bilateral 1988  . Tonsillectomy  1988    Social History:  reports that she has never smoked. She  has never used smokeless tobacco. She reports that she drinks alcohol. She reports that she does not use illicit drugs.  Allergies:  Allergies  Allergen Reactions  . Adhesive [Tape] Other (See Comments)    redness    Medications Prior to Admission  Medication Sig Dispense Refill  . Alpha-D-Galactosidase (BEANO PO) Take 1 tablet by mouth 2 (two) times daily as needed (gas).      Marland Kitchen HYDROcodone-acetaminophen (NORCO) 10-325 MG per tablet Take 0.5-1 tablets by mouth 4 (four) times daily as needed (pain).      Marland Kitchen ibuprofen (ADVIL,MOTRIN) 200 MG tablet Take 800-1,000 mg by mouth 2 (two) times daily as needed (daily). For pain      . predniSONE (DELTASONE) 20 MG tablet Take 20-40 mg by mouth daily with breakfast. As directed by md      . mesalamine (PENTASA) 500 MG CR capsule Take 2 capsules (1,000 mg total) by mouth 4 (four) times daily.  168 capsule  0  . promethazine (PHENERGAN) 12.5 MG tablet Take 1 tablet (12.5 mg total) by mouth every 8 (eight) hours as needed for nausea or vomiting.  30 tablet  0    Blood pressure 120/68, pulse 83, temperature 97.8 F (36.6 C), temperature source Oral, resp. rate 20, height 5' 7.5" (1.715 m), weight 242 lb 8.1 oz (110 kg), last menstrual period 09/26/2013, SpO2 100.00%. Physical Exam: General: pleasant, obese white female who is laying  in bed in NAD HEENT: head is normocephalic, atraumatic.  Sclera are noninjected.  PERRL.  Ears and nose without any masses or lesions.  Mouth is pink and moist Heart: regular, rate, and rhythm.  Normal s1,s2. No obvious murmurs, gallops, or rubs noted.  Palpable radial and pedal pulses bilaterally Lungs: CTAB, no wheezes, rhonchi, or rales noted.  Respiratory effort nonlabored Abd: soft, some mild tenderness in right mid and RLQ, ND, +BS, no masses, hernias, or organomegaly.  She does have some palpable hardness on her right mid and RLQ part of her abdominal wall.  Suspect this represents the phlegmon noted on CT scan. MS: all  4 extremities are symmetrical with no cyanosis, clubbing, or edema. Skin: warm and dry with no masses, lesions, or rashes Psych: A&Ox3 with an appropriate affect.    Results for orders placed during the hospital encounter of 11/10/13 (from the past 48 hour(s))  CBC WITH DIFFERENTIAL     Status: Abnormal   Collection Time    11/10/13  5:10 PM      Result Value Range   WBC 20.5 (*) 4.0 - 10.5 K/uL   RBC 4.05  3.87 - 5.11 MIL/uL   Hemoglobin 11.4 (*) 12.0 - 15.0 g/dL   HCT 11.9 (*) 14.7 - 82.9 %   MCV 85.9  78.0 - 100.0 fL   MCH 28.1  26.0 - 34.0 pg   MCHC 32.8  30.0 - 36.0 g/dL   RDW 56.2  13.0 - 86.5 %   Platelets 487 (*) 150 - 400 K/uL   Neutrophils Relative % 77  43 - 77 %   Neutro Abs 15.7 (*) 1.7 - 7.7 K/uL   Lymphocytes Relative 15  12 - 46 %   Lymphs Abs 3.1  0.7 - 4.0 K/uL   Monocytes Relative 8  3 - 12 %   Monocytes Absolute 1.6 (*) 0.1 - 1.0 K/uL   Eosinophils Relative 0  0 - 5 %   Eosinophils Absolute 0.1  0.0 - 0.7 K/uL   Basophils Relative 0  0 - 1 %   Basophils Absolute 0.0  0.0 - 0.1 K/uL  COMPREHENSIVE METABOLIC PANEL     Status: Abnormal   Collection Time    11/10/13  5:10 PM      Result Value Range   Sodium 135 (*) 137 - 147 mEq/L   Comment: Please note change in reference range.   Potassium 3.5 (*) 3.7 - 5.3 mEq/L   Comment: Please note change in reference range.   Chloride 92 (*) 96 - 112 mEq/L   CO2 28  19 - 32 mEq/L   Glucose, Bld 92  70 - 99 mg/dL   BUN 10  6 - 23 mg/dL   Creatinine, Ser 7.84  0.50 - 1.10 mg/dL   Calcium 9.0  8.4 - 69.6 mg/dL   Total Protein 7.8  6.0 - 8.3 g/dL   Albumin 3.2 (*) 3.5 - 5.2 g/dL   AST 8  0 - 37 U/L   ALT 14  0 - 35 U/L   Alkaline Phosphatase 89  39 - 117 U/L   Total Bilirubin 0.6  0.3 - 1.2 mg/dL   GFR calc non Af Amer >90  >90 mL/min   GFR calc Af Amer >90  >90 mL/min   Comment: (NOTE)     The eGFR has been calculated using the CKD EPI equation.     This calculation has not been validated in all clinical  situations.  eGFR's persistently <90 mL/min signify possible Chronic Kidney     Disease.  LIPASE, BLOOD     Status: None   Collection Time    11/10/13  5:10 PM      Result Value Range   Lipase 14  11 - 59 U/L  URINALYSIS, ROUTINE W REFLEX MICROSCOPIC     Status: Abnormal   Collection Time    11/10/13  5:30 PM      Result Value Range   Color, Urine AMBER (*) YELLOW   Comment: BIOCHEMICALS MAY BE AFFECTED BY COLOR   APPearance CLEAR  CLEAR   Specific Gravity, Urine 1.025  1.005 - 1.030   pH 7.0  5.0 - 8.0   Glucose, UA NEGATIVE  NEGATIVE mg/dL   Hgb urine dipstick NEGATIVE  NEGATIVE   Bilirubin Urine SMALL (*) NEGATIVE   Ketones, ur 15 (*) NEGATIVE mg/dL   Protein, ur NEGATIVE  NEGATIVE mg/dL   Urobilinogen, UA 1.0  0.0 - 1.0 mg/dL   Nitrite NEGATIVE  NEGATIVE   Leukocytes, UA SMALL (*) NEGATIVE  URINE MICROSCOPIC-ADD ON     Status: Abnormal   Collection Time    11/10/13  5:30 PM      Result Value Range   Squamous Epithelial / LPF RARE  RARE   WBC, UA 0-2  <3 WBC/hpf   RBC / HPF 0-2  <3 RBC/hpf   Bacteria, UA MANY (*) RARE  POCT PREGNANCY, URINE     Status: None   Collection Time    11/10/13  5:53 PM      Result Value Range   Preg Test, Ur NEGATIVE  NEGATIVE   Comment:            THE SENSITIVITY OF THIS     METHODOLOGY IS >24 mIU/mL  OCCULT BLOOD, POC DEVICE     Status: None   Collection Time    11/10/13  9:16 PM      Result Value Range   Fecal Occult Bld NEGATIVE  NEGATIVE  BASIC METABOLIC PANEL     Status: Abnormal   Collection Time    11/11/13  7:17 AM      Result Value Range   Sodium 136 (*) 137 - 147 mEq/L   Comment: Please note change in reference range.   Potassium 4.1  3.7 - 5.3 mEq/L   Comment: Please note change in reference range.   Chloride 98  96 - 112 mEq/L   CO2 25  19 - 32 mEq/L   Glucose, Bld 123 (*) 70 - 99 mg/dL   BUN 6  6 - 23 mg/dL   Creatinine, Ser 6.96  0.50 - 1.10 mg/dL   Calcium 8.7  8.4 - 29.5 mg/dL   GFR calc non Af Amer >90  >90  mL/min   GFR calc Af Amer >90  >90 mL/min   Comment: (NOTE)     The eGFR has been calculated using the CKD EPI equation.     This calculation has not been validated in all clinical situations.     eGFR's persistently <90 mL/min signify possible Chronic Kidney     Disease.  CBC     Status: Abnormal   Collection Time    11/11/13  7:17 AM      Result Value Range   WBC 17.6 (*) 4.0 - 10.5 K/uL   RBC 3.78 (*) 3.87 - 5.11 MIL/uL   Hemoglobin 10.3 (*) 12.0 - 15.0 g/dL   HCT 28.4 (*) 13.2 - 44.0 %  MCV 86.8  78.0 - 100.0 fL   MCH 27.2  26.0 - 34.0 pg   MCHC 31.4  30.0 - 36.0 g/dL   RDW 16.1  09.6 - 04.5 %   Platelets 432 (*) 150 - 400 K/uL   Ct Abdomen Pelvis W Contrast  11/10/2013   CLINICAL DATA:  Patient complains of abdominal pain with nausea and vomiting for 2 days, with history of Crohn's disease, pain primarily right lower quadrant  EXAM: CT ABDOMEN AND PELVIS WITH CONTRAST  TECHNIQUE: Multidetector CT imaging of the abdomen and pelvis was performed using the standard protocol following bolus administration of intravenous contrast.  CONTRAST:  OMNIPAQUE IOHEXOL 300 MG/ML  SOLN  COMPARISON:  Multiple prior studies the most recent of which is 05/30/13  FINDINGS: The visualized portions of the lung bases are clear.  The liver, gallbladder, and spleen are normal except for a 3 mm subcapsular low-attenuation lesion in the anterior superior spleen likely not of clinical consequence. The pancreas, kidneys, and adrenal glands are normal. Bladder is normal. Uterus appears normal. Right ovary is normal. Left ovary demonstrates a 20 x 28 mm low-attenuation oval lesions likely a cyst. There is free fluid in the cul-de-sac.  The terminal ileum shows severe wall thickening. There is an intervening section of relatively normal ileum, with more proximal ileum also showing severe wall thickening she entire a mass of inflamed small bowel measures approximately 13 x 9 cm in cross-section on image number  56. There are several fluid collections associated with this area of abnormal bowel. One such collection measures 33 x 50 mm and is seen at in the midline over the lumbar spine. This is seen best on image number 61. Another collection measures about 1.5 cm and is seen in the anterior right aspect of the inflamed area. This is seen best on image number 58. Of 3rd fluid collection measures about 2 cm, and is located on image number 53 anterior to the right psoas muscle. It appears to contain a few foci of gas within it. Within this area of inflamed matted bowel, there are numerous enlarged lymph nodes. There are also tract of attenuation that would suggest developing fistula, as seen on image number 61 to the left of the conglomerated inflammatory process.  Bowel gas pattern is nonobstructive. There are no acute musculoskeletal findings. There is moderate sclerotic change involving both sacroiliac joints.  IMPRESSION: 1. Findings again consistent with provided history of Crohn's disease, with extensive distal small bowel inflammatory change. Most of the ileum is inflamed with matted loops of bowel and multiple interloop abscesses. There is adenopathy and there is concern for developing fistula. 2. Findings consistent with left ovarian ruptured hemorrhagic cyst 3. Sacroiliitis associated with inflammatory bowel disease.   Electronically Signed   By: Esperanza Heir M.D.   On: 11/10/2013 19:42       Assessment/Plan 1. Presumed crohn's disease, no official tissue diagnosis yet 2. Terminal ileitis with several interloop abscesses  Plan: 1. The patient actually looks better than her scan does.  Given the amount of inflammation seen on her CT scan, we would recommend IV abx therapy for at least a couple of days to try to help resolve these abscesses as she is unable to be drained by IR.  This case was discussed with IR to determine if there was anything they could help with. 2. Would continue clear liquids for  now. 3. GI has been contacted to assist with her crohn's management.  Brigitte Soderberg  E 11/11/2013, 11:19 AM Pager: [phone]

## 2013-11-11 NOTE — H&P (Signed)
FMTS Attending  Note: Gina Clegg,MD I  have seen and examined this patient, reviewed their chart. I have discussed this patient with the resident. I agree with the resident's findings, assessment and care plan.  

## 2013-11-11 NOTE — Progress Notes (Signed)
Nutrition Brief Note  Patient identified on the Malnutrition Screening Tool (MST) Report  Wt Readings from Last 15 Encounters:  11/10/13 242 lb 8.1 oz (110 kg)  10/23/13 243 lb (110.224 kg)  07/30/13 243 lb (110.224 kg)  08/15/12 245 lb 11.2 oz (111.449 kg)  08/01/12 249 lb 5.4 oz (113.1 kg)    Body mass index is 37.4 kg/(m^2). Patient meets criteria for obese, class based on current BMI.   Current diet order is clear liquids, patient is consuming approximately 50% of meals at this time. Labs and medications reviewed.   Pt admitted with Crohn's flare up.  Pt known to this RD from initial dx 1 year ago.  Pt states she has been managing her Crohn's well. She has identified some foods she believes are not well-tolerated and is able to eliminate them from diet.  She feels recent flare is due to lack of medication.  RD notes ongoing process for improving access to medication.  Pt admits to "a few pounds of wt loss" over the past few weeks with most recent flare.  She denies nutrition-related concerns and feels confident managing her diet. No nutrition interventions warranted at this time. If nutrition issues arise, please consult RD.   Loyce Dys, MS RD LDN Clinical Inpatient Dietitian Pager: [phone] Weekend/After hours pager: [phone]

## 2013-11-11 NOTE — Progress Notes (Signed)
FMTS Attending Note: Gina Ruttan,MD I  have seen and examined this patient, reviewed their chart. I have discussed this patient with the resident. I agree with the resident's findings, assessment and care plan.  

## 2013-11-11 NOTE — Consult Note (Signed)
Unassigned Patient  Reason for Consult: Crohn's disease Referring Physician: Teaching Service  Gina Gonzalez HPI: This is a 32 year old female with a presumptive diagnosis of Crohn's disease by CT scan admitted for abdominal pain.  Last Friday she suffered an acute bout of RLQ pain, but improved spontaneously.  However, on Sunday her pain continued to persist.  The pain intensified to the point that she presented to the ER.  In the past she was evaluated by [facility] and she was supposed to follow up, but she lacked the funds.  When she initially presented in 2013 she had a microperforation, which precluded a colonoscopy.  Her current CT scan reveals multiple interloop abscesses.  She has only been treated with steroids in the past.    Past Medical History  Diagnosis Date  . Crohn disease   . Complication of anesthesia     "took a little while for me to come out of it"  . Family history of anesthesia complication     "takes Mom a little while to come out" (11/10/2013)  . Anemia 2013  . ZHYQMVHQ(469.6)     "couple times/month" (11/10/2013)    Past Surgical History  Procedure Laterality Date  . Tonsilectomy, adenoidectomy, bilateral myringotomy and tubes Bilateral 1988  . Tonsillectomy  1988    Family History  Problem Relation Age of Onset  . Thyroid disease Mother   . Cancer Maternal Grandmother     Social History:  reports that she has never smoked. She has never used smokeless tobacco. She reports that she drinks alcohol. She reports that she does not use illicit drugs.  Allergies:  Allergies  Allergen Reactions  . Adhesive [Tape] Other (See Comments)    redness    Medications:  Scheduled: . [COMPLETED] sodium chloride   Intravenous STAT  . antiseptic oral rinse  15 mL Mouth Rinse q12n4p  . chlorhexidine  15 mL Mouth Rinse BID  . heparin  5,000 Units Subcutaneous Q8H  . piperacillin-tazobactam (ZOSYN)  IV  3.375 g Intravenous Q8H  . predniSONE  40 mg Oral Q breakfast    Continuous:   Results for orders placed during the hospital encounter of 11/10/13 (from the past 24 hour(s))  CBC WITH DIFFERENTIAL     Status: Abnormal   Collection Time    11/10/13  5:10 PM      Result Value Range   WBC 20.5 (*) 4.0 - 10.5 K/uL   RBC 4.05  3.87 - 5.11 MIL/uL   Hemoglobin 11.4 (*) 12.0 - 15.0 g/dL   HCT 29.5 (*) 28.4 - 13.2 %   MCV 85.9  78.0 - 100.0 fL   MCH 28.1  26.0 - 34.0 pg   MCHC 32.8  30.0 - 36.0 g/dL   RDW 44.0  10.2 - 72.5 %   Platelets 487 (*) 150 - 400 K/uL   Neutrophils Relative % 77  43 - 77 %   Neutro Abs 15.7 (*) 1.7 - 7.7 K/uL   Lymphocytes Relative 15  12 - 46 %   Lymphs Abs 3.1  0.7 - 4.0 K/uL   Monocytes Relative 8  3 - 12 %   Monocytes Absolute 1.6 (*) 0.1 - 1.0 K/uL   Eosinophils Relative 0  0 - 5 %   Eosinophils Absolute 0.1  0.0 - 0.7 K/uL   Basophils Relative 0  0 - 1 %   Basophils Absolute 0.0  0.0 - 0.1 K/uL  COMPREHENSIVE METABOLIC PANEL     Status:  Abnormal   Collection Time    11/10/13  5:10 PM      Result Value Range   Sodium 135 (*) 137 - 147 mEq/L   Potassium 3.5 (*) 3.7 - 5.3 mEq/L   Chloride 92 (*) 96 - 112 mEq/L   CO2 28  19 - 32 mEq/L   Glucose, Bld 92  70 - 99 mg/dL   BUN 10  6 - 23 mg/dL   Creatinine, Ser 1.61  0.50 - 1.10 mg/dL   Calcium 9.0  8.4 - 09.6 mg/dL   Total Protein 7.8  6.0 - 8.3 g/dL   Albumin 3.2 (*) 3.5 - 5.2 g/dL   AST 8  0 - 37 U/L   ALT 14  0 - 35 U/L   Alkaline Phosphatase 89  39 - 117 U/L   Total Bilirubin 0.6  0.3 - 1.2 mg/dL   GFR calc non Af Amer >90  >90 mL/min   GFR calc Af Amer >90  >90 mL/min  LIPASE, BLOOD     Status: None   Collection Time    11/10/13  5:10 PM      Result Value Range   Lipase 14  11 - 59 U/L  URINALYSIS, ROUTINE W REFLEX MICROSCOPIC     Status: Abnormal   Collection Time    11/10/13  5:30 PM      Result Value Range   Color, Urine AMBER (*) YELLOW   APPearance CLEAR  CLEAR   Specific Gravity, Urine 1.025  1.005 - 1.030   pH 7.0  5.0 - 8.0   Glucose, UA  NEGATIVE  NEGATIVE mg/dL   Hgb urine dipstick NEGATIVE  NEGATIVE   Bilirubin Urine SMALL (*) NEGATIVE   Ketones, ur 15 (*) NEGATIVE mg/dL   Protein, ur NEGATIVE  NEGATIVE mg/dL   Urobilinogen, UA 1.0  0.0 - 1.0 mg/dL   Nitrite NEGATIVE  NEGATIVE   Leukocytes, UA SMALL (*) NEGATIVE  URINE MICROSCOPIC-ADD ON     Status: Abnormal   Collection Time    11/10/13  5:30 PM      Result Value Range   Squamous Epithelial / LPF RARE  RARE   WBC, UA 0-2  <3 WBC/hpf   RBC / HPF 0-2  <3 RBC/hpf   Bacteria, UA MANY (*) RARE  POCT PREGNANCY, URINE     Status: None   Collection Time    11/10/13  5:53 PM      Result Value Range   Preg Test, Ur NEGATIVE  NEGATIVE  OCCULT BLOOD, POC DEVICE     Status: None   Collection Time    11/10/13  9:16 PM      Result Value Range   Fecal Occult Bld NEGATIVE  NEGATIVE  BASIC METABOLIC PANEL     Status: Abnormal   Collection Time    11/11/13  7:17 AM      Result Value Range   Sodium 136 (*) 137 - 147 mEq/L   Potassium 4.1  3.7 - 5.3 mEq/L   Chloride 98  96 - 112 mEq/L   CO2 25  19 - 32 mEq/L   Glucose, Bld 123 (*) 70 - 99 mg/dL   BUN 6  6 - 23 mg/dL   Creatinine, Ser 0.45  0.50 - 1.10 mg/dL   Calcium 8.7  8.4 - 40.9 mg/dL   GFR calc non Af Amer >90  >90 mL/min   GFR calc Af Amer >90  >90 mL/min  CBC  Status: Abnormal   Collection Time    11/11/13  7:17 AM      Result Value Range   WBC 17.6 (*) 4.0 - 10.5 K/uL   RBC 3.78 (*) 3.87 - 5.11 MIL/uL   Hemoglobin 10.3 (*) 12.0 - 15.0 g/dL   HCT 16.1 (*) 09.6 - 04.5 %   MCV 86.8  78.0 - 100.0 fL   MCH 27.2  26.0 - 34.0 pg   MCHC 31.4  30.0 - 36.0 g/dL   RDW 40.9  81.1 - 91.4 %   Platelets 432 (*) 150 - 400 K/uL     Ct Abdomen Pelvis W Contrast  11/10/2013   CLINICAL DATA:  Patient complains of abdominal pain with nausea and vomiting for 2 days, with history of Crohn's disease, pain primarily right lower quadrant  EXAM: CT ABDOMEN AND PELVIS WITH CONTRAST  TECHNIQUE: Multidetector CT imaging of the  abdomen and pelvis was performed using the standard protocol following bolus administration of intravenous contrast.  CONTRAST:  OMNIPAQUE IOHEXOL 300 MG/ML  SOLN  COMPARISON:  Multiple prior studies the most recent of which is 05/30/13  FINDINGS: The visualized portions of the lung bases are clear.  The liver, gallbladder, and spleen are normal except for a 3 mm subcapsular low-attenuation lesion in the anterior superior spleen likely not of clinical consequence. The pancreas, kidneys, and adrenal glands are normal. Bladder is normal. Uterus appears normal. Right ovary is normal. Left ovary demonstrates a 20 x 28 mm low-attenuation oval lesions likely a cyst. There is free fluid in the cul-de-sac.  The terminal ileum shows severe wall thickening. There is an intervening section of relatively normal ileum, with more proximal ileum also showing severe wall thickening she entire a mass of inflamed small bowel measures approximately 13 x 9 cm in cross-section on image number 56. There are several fluid collections associated with this area of abnormal bowel. One such collection measures 33 x 50 mm and is seen at in the midline over the lumbar spine. This is seen best on image number 61. Another collection measures about 1.5 cm and is seen in the anterior right aspect of the inflamed area. This is seen best on image number 58. Of 3rd fluid collection measures about 2 cm, and is located on image number 53 anterior to the right psoas muscle. It appears to contain a few foci of gas within it. Within this area of inflamed matted bowel, there are numerous enlarged lymph nodes. There are also tract of attenuation that would suggest developing fistula, as seen on image number 61 to the left of the conglomerated inflammatory process.  Bowel gas pattern is nonobstructive. There are no acute musculoskeletal findings. There is moderate sclerotic change involving both sacroiliac joints.  IMPRESSION: 1. Findings again  consistent with provided history of Crohn's disease, with extensive distal small bowel inflammatory change. Most of the ileum is inflamed with matted loops of bowel and multiple interloop abscesses. There is adenopathy and there is concern for developing fistula. 2. Findings consistent with left ovarian ruptured hemorrhagic cyst 3. Sacroiliitis associated with inflammatory bowel disease.   Electronically Signed   By: Esperanza Heir M.D.   On: 11/10/2013 19:42    ROS:  As stated above in the HPI otherwise negative.  Blood pressure 124/67, pulse 80, temperature 98.2 F (36.8 C), temperature source Oral, resp. rate 18, height 5' 7.5" (1.715 m), weight 242 lb 8.1 oz (110 kg), last menstrual period 09/26/2013, SpO2 100.00%.  PE: Gen: NAD, Alert and Oriented HEENT:  [place]/AT, EOMI Neck: Supple, no LAD Lungs: CTA Bilaterally CV: RRR without M/G/R ABM: Soft, RLQ pain, +BS Ext: No C/C/E  Assessment/Plan: 1) Probable Crohn's disease. 2) Interloop abscesses.   The patient has a severe case of Crohn's disease.  Given her current findings Surgical intervention will be required.  I reviewed Gina Gonzalez's note and there is mention that IR cannot drain the abscesses.  Steroids are appropriate for the patient as well as a biologic agent.  5-ASA will not be of any benefit in this situation.  Given her infection a biologic agent, anti-TNF agent, cannot be used.  If surgery is pursued she can be brought into surgical remission, however, it will be important for her to start on an anti-TNF agent to maintain the remission.  Plan: 1) Steroids. 2) Antibiotics. 3) Pain control. 4) ? Surgical resection versus surgical drainage of the abscesses.  Hanley Rispoli D 11/11/2013, 3:25 PM

## 2013-11-11 NOTE — Care Management Note (Signed)
  Page 2 of 2   11/11/2013     12:11:05 PM   CARE MANAGEMENT NOTE 11/11/2013  Patient:  Gina Gonzalez,Gina Gonzalez   Account Number:  [id number]  Date Initiated:  11/11/2013  Documentation initiated by:  Ronny Flurry  Subjective/Objective Assessment:     Action/Plan:   Anticipated DC Date:     Anticipated DC Plan:  HOME/SELF CARE  In-house referral  Financial Counselor         Choice offered to / List presented to:             Status of service:   Medicare Important Message given?   (If response is "NO", the following Medicare IM given date fields will be blank) Date Medicare IM given:   Date Additional Medicare IM given:    Discharge Disposition:    Per UR Regulation:    If discussed at Long Length of Stay Meetings, dates discussed:    Comments:  11-13-13 Patient has already researched health insurance states it would cost her $130 / month and she cannot afford this , also stated she has gotten coupons for med before , then med costs $250 / month which she cannot afford . She has started process for Medicaid and was denied , states she is working with Estate manager/land agent in Clinic for orange card . I have already left message for [facility] .   Re consulted today for assistance for medication.    Again cost of med is over $1000, if received approval to assist with [facility] program , patient would only receive a month worth , nothing to help her after that month . Patient has medication assistance application in room . Paged resident with pager [phone] explained this , also explained a MD has a portion of application to complete.   Ronny Flurry RN BSN [phone]      11-11-13 Referral for assistance for Mesalamine 500 mg CR 2 cap QID . Patient followed by family medicine . Already in process of applying for orange card , left message for Carlisle Beers [phone]  in Family Medicine she is out of the office until Jan 5 , 2015 . Patient has Ms McGreger's contact information.  Provided patinet  application for assistance through drug company . MD will have to complete MD portion , then patinet mails completed application and proof of income to drug company . Patient  also has 1 800 phone number for medication assistance program , encouragement her to call same.  Cost of medicine is over $1000 / month . Patient not currently on it in hospital . Not sure if she could benefit from another medication.  Ronny Flurry RN BSN [phone]

## 2013-11-11 NOTE — Progress Notes (Signed)
FMTS Attending  Note: Dwana Garin,MD I  have seen and examined this patient, reviewed their chart. I have discussed this patient with the resident. I agree with the resident's findings, assessment and care plan.  

## 2013-11-11 NOTE — Clinical Social Work Note (Signed)
CSW received consult for financial services for pt. CSW consulted financial counseling services with [facility] to please contact pt and discuss her eligibility and options for insurance. CSW signing off. Please re consult if needed.  Darlyn Chamber, LCSWA Clinical Social Worker [phone]

## 2013-11-11 NOTE — Consult Note (Signed)
I have seen and examined the pt and agree with PA-Osborne's H&P note. Multiple intraloop abscesses that are not amenable to IR drainage. Would con't with IV abx Pt needs to follow-through with GI workup Patient feels much better today.  Would avoid surgical treatment at this time unless forced from a clinical standpoint. Call back with any questions

## 2013-11-12 DIAGNOSIS — K630 Abscess of intestine: Secondary | ICD-10-CM

## 2013-11-12 DIAGNOSIS — K5010 Crohn's disease of large intestine without complications: Principal | ICD-10-CM

## 2013-11-12 LAB — BASIC METABOLIC PANEL
BUN: 6 mg/dL (ref 6–23)
CO2: 24 mEq/L (ref 19–32)
Calcium: 8.6 mg/dL (ref 8.4–10.5)
Chloride: 99 mEq/L (ref 96–112)
Creatinine, Ser: 0.7 mg/dL (ref 0.50–1.10)
GFR calc Af Amer: >90 mL/min (ref >90)
GFR calc non Af Amer: >90 mL/min (ref >90)
Glucose, Bld: 90 mg/dL (ref 70–99)
Potassium: 3.1 mEq/L — ABNORMAL LOW (ref 3.7–5.3)
Sodium: 140 mEq/L (ref 137–147)

## 2013-11-12 LAB — CBC
HCT: 29.8 % — ABNORMAL LOW (ref 36.0–46.0)
Hemoglobin: 9.5 g/dL — ABNORMAL LOW (ref 12.0–15.0)
MCH: 27.7 pg (ref 26.0–34.0)
MCHC: 31.9 g/dL (ref 30.0–36.0)
MCV: 86.9 fL (ref 78.0–100.0)
Platelets: 397 10*3/uL (ref 150–400)
RBC: 3.43 MIL/uL — ABNORMAL LOW (ref 3.87–5.11)
RDW: 14.8 % (ref 11.5–15.5)
WBC: 15.5 10*3/uL — ABNORMAL HIGH (ref 4.0–10.5)

## 2013-11-12 MED ORDER — SODIUM CHLORIDE 0.9 % IV SOLN
INTRAVENOUS | Status: DC
  Administered 2013-11-12: 20 mL/h via INTRAVENOUS
  Administered 2013-11-13: 08:00:00 via INTRAVENOUS

## 2013-11-12 MED ORDER — IBUPROFEN 400 MG PO TABS
400.0000 mg | ORAL_TABLET | Freq: Four times a day (QID) | ORAL | Status: DC | PRN
  Administered 2013-11-12 – 2013-11-15 (×4): 400 mg via ORAL
  Filled 2013-11-12 (×5): qty 1

## 2013-11-12 MED ORDER — CYCLOBENZAPRINE HCL 5 MG PO TABS
2.5000 mg | ORAL_TABLET | Freq: Two times a day (BID) | ORAL | Status: DC | PRN
  Administered 2013-11-12: 2.5 mg via ORAL
  Filled 2013-11-12: qty 1

## 2013-11-12 MED ORDER — POTASSIUM CHLORIDE 10 MEQ/100ML IV SOLN
10.0000 meq | INTRAVENOUS | Status: AC
  Administered 2013-11-12 (×4): 10 meq via INTRAVENOUS
  Filled 2013-11-12 (×2): qty 100

## 2013-11-12 MED ORDER — SODIUM CHLORIDE 0.9 % IV SOLN: INTRAVENOUS | Status: DC

## 2013-11-12 NOTE — Progress Notes (Signed)
Progress Note   Subjective  feeling much better than when she came in.    Objective   Vital signs in last 24 hours: Temp:  [97.9 F (36.6 C)-98.1 F (36.7 C)] 98 F (36.7 C) (01/01 1350) Pulse Rate:  [75-86] 78 (01/01 1350) Resp:  [16-17] 16 (01/01 1350) BP: (122-126)/(63-66) 122/64 mmHg (01/01 1350) SpO2:  [98 %-100 %] 99 % (01/01 1350) Last BM Date: 11/11/13 General:    Pleasant white female in NAD Heart:  Regular rate and rhythm Lungs: Respirations even and unlabored, lungs CTA bilaterally Abdomen:  Soft, nondistended, mild RLQ tenderness. Normal bowel sounds. Extremities:  Without edema. Neurologic:  Alert and oriented,  grossly normal neurologically. Psych:  Cooperative. Normal mood and affect.  Lab Results:  Recent Labs  11/10/13 1710 11/11/13 0717 11/12/13 0500  WBC 20.5* 17.6* 15.5*  HGB 11.4* 10.3* 9.5*  HCT 34.8* 32.8* 29.8*  PLT 487* 432* 397   BMET  Recent Labs  11/10/13 1710 11/11/13 0717 11/12/13 0500  NA 135* 136* 140  K 3.5* 4.1 3.1*  CL 92* 98 99  CO2 28 25 24   GLUCOSE 92 123* 90  BUN 10 6 6   CREATININE 0.72 0.63 0.70  CALCIUM 9.0 8.7 8.6   LFT  Recent Labs  11/10/13 1710  PROT 7.8  ALBUMIN 3.2*  AST 8  ALT 14  ALKPHOS 89  BILITOT 0.6    Studies/Results: Ct Abdomen Pelvis W Contrast  11/10/2013   CLINICAL DATA:  Patient complains of abdominal pain with nausea and vomiting for 2 days, with history of Crohn's disease, pain primarily right lower quadrant  EXAM: CT ABDOMEN AND PELVIS WITH CONTRAST  TECHNIQUE: Multidetector CT imaging of the abdomen and pelvis was performed using the standard protocol following bolus administration of intravenous contrast.  CONTRAST:  100mL OMNIPAQUE IOHEXOL 300 MG/ML  SOLN  COMPARISON:  Multiple prior studies the most recent of which is 05/30/13  FINDINGS: The visualized portions of the lung bases are clear.  The liver, gallbladder, and spleen are normal except for a 3 mm subcapsular  low-attenuation lesion in the anterior superior spleen likely not of clinical consequence. The pancreas, kidneys, and adrenal glands are normal. Bladder is normal. Uterus appears normal. Right ovary is normal. Left ovary demonstrates a 20 x 28 mm low-attenuation oval lesions likely a cyst. There is free fluid in the cul-de-sac.  The terminal ileum shows severe wall thickening. There is an intervening section of relatively normal ileum, with more proximal ileum also showing severe wall thickening she entire a mass of inflamed small bowel measures approximately 13 x 9 cm in cross-section on image number 56. There are several fluid collections associated with this area of abnormal bowel. One such collection measures 33 x 50 mm and is seen at in the midline over the lumbar spine. This is seen best on image number 61. Another collection measures about 1.5 cm and is seen in the anterior right aspect of the inflamed area. This is seen best on image number 58. Of 3rd fluid collection measures about 2 cm, and is located on image number 53 anterior to the right psoas muscle. It appears to contain a few foci of gas within it. Within this area of inflamed matted bowel, there are numerous enlarged lymph nodes. There are also tract of attenuation that would suggest developing fistula, as seen on image number 61 to the left of the conglomerated inflammatory process.  Bowel gas pattern is nonobstructive. There are no acute  musculoskeletal findings. There is moderate sclerotic change involving both sacroiliac joints.  IMPRESSION: 1. Findings again consistent with provided history of Crohn's disease, with extensive distal small bowel inflammatory change. Most of the ileum is inflamed with matted loops of bowel and multiple interloop abscesses. There is adenopathy and there is concern for developing fistula. 2. Findings consistent with left ovarian ruptured hemorrhagic cyst 3. Sacroiliitis associated with inflammatory bowel disease.    Electronically Signed   By: Esperanza Heir M.D.   On: 11/10/2013 19:42     Assessment / Plan:    33 year old female with severe crohn's ileitis by CTscan (never had colonoscopy / biopsy) with abscess and ? fistula.  Abscesses not amenable to drainage per IR. Surgery is following along. Patient feels she has improved significantly.  Still having diarrhea but pain much better. Tolerating fulls. On Zosyn and 40mg  of prednisone. Continue present course for now. Dr. Elnoria Howard to see tomorrow.    LOS: 2 days   Willette Cluster  11/12/2013, 3:15 PM   [facility] Attending  I have also seen and assessed the patient and agree with the above note. Iva Boop, MD, [facility] [phone] (pager) 11/12/2013 4:11 PM

## 2013-11-12 NOTE — Progress Notes (Signed)
Family Medicine Teaching Service Daily Progress Note Intern Pager: [phone]  Patient name: Gina Gonzalez Medical record number: [id number] Date of birth: [birth year] Age: 33 y.o. Gender: female  Primary Care Provider: Kevin Fenton, MD Consultants: GI, surgery Code Status: FULL  Pt Overview and Major Events to Date:  12/30: Admitted due to Crohn's Ileitis with abscesses and ?fistula in ileus, GI made aware 12/31: Surgery consulted; No surgery recommended unless patient worsens.  Abx switched to Zosyn. 12/31: GI consulted.  Continue Abx and Prednisone.   IR also consulted - abscesses not amendable to drainage.   Assessment and Plan:  Gina Gonzalez is a 33 y.o. female presenting with likely Crohn's Disease flare with ileitis seen on CT scan. PMH is significant for Crohn's Disease diagnosed ~1.5 years ago with 1 prior hospitalization Sept 2013 with perforated small bowel.   # Crohn's Ileitis - H/o Crohn's Disease with ileitis seen on CT scan and reported hx of worsening episodic abdominal pain that became constant 12/29; also concern for abscesses and fistula on CT, but nonobstructive bowel gas pattern. FOBT negative.  - For ileitis - Abx Day 3; Now on Zosyn for better penetration given multiple abscesses on CT. - Morphine IV 4 mg Q 2H PRN, zofran IV prn  - Clear liquid diet currently.  Advancing to full liquid later today.  - GI Following; We appreciate their help in managing this patient.  Will continue to follow recommendations. Continuing prednisone 40mg  PO daily (Day 2). - Surgery consulted and patient has been evaluated.  Surgery stated that they would avoid surgery unless forced to by clinical picture/standpoint.  Will re-contact surgery if patient worsens/fails to improve clinically.  - IR has also been contacted and abscesses not amendable to drainage.   # Leukocytosis - Likely secondary to prednisone use vs active Crohn's ileitis.  - Continuing to improve.  WBC 15.5 this am.  -  Abx as above  # UA with many bacteria - Appears dehydrated with 15 ketones and small bili, also however has many bacteria and small leukocytes, rare squamous cells, nitrite neg  - Patient asymptomatic, thus will not pursue further workup/treatment.   # Anemia - Likely hemodilutional after hemoconcentration with vomiting.  - Hb trending downward - 9.5 today. - Will continue to monitor.   # Social - Pt reports difficulty affording mesalamine. Has also not seen GI as outpatient due to concern about cost and no insurance, per PCP notes.  - Consulted SW to help with insurance options and med affordability  FEN/GI: KVO IV fluids, clear liquid diet.  Prophylaxis: SQ heparin   Disposition: Discharge pending improvement.    Subjective:  Patient sitting up eating breakfast this am. Reports intermittent abdominal pain, currently 5/10 in severity. She also notes diarrhea x 2 this am. No other complaints.  No acute overnight events.   Objective: Temp:  [97.9 F (36.6 C)-98.2 F (36.8 C)] 98.1 F (36.7 C) (01/01 0537) Pulse Rate:  [75-86] 75 (01/01 0537) Resp:  [17-18] 17 (01/01 0537) BP: (123-126)/(63-67) 126/63 mmHg (01/01 0537) SpO2:  [98 %-100 %] 98 % (01/01 0537)  Physical Exam: General: Well appearing female in NAD.  Cardiovascular: RRR, no m/r/g Respiratory: CTAB, no rales, rhonchi, or wheezing.  Abdomen: Soft, nondistended.  Mild-moderately tender to palpation in the RLQ.  Hyperactive BS noted X 4.  Extremities: No edema noted.  Laboratory/Imaging:  Recent Labs Lab 11/10/13 1710 11/11/13 0717 11/12/13 0500  WBC 20.5* 17.6* 15.5*  HGB 11.4* 10.3* 9.5*  HCT 34.8* 32.8*  29.8*  PLT 487* 432* 397    Recent Labs Lab 11/10/13 1710 11/11/13 0717 11/12/13 0500  NA 135* 136* 140  K 3.5* 4.1 3.1*  CL 92* 98 99  CO2 28 25 24   BUN 10 6 6   CREATININE 0.72 0.63 0.70  CALCIUM 9.0 8.7 8.6  PROT 7.8  --   --   BILITOT 0.6  --   --   ALKPHOS 89  --   --   ALT 14  --   --    AST 8  --   --   GLUCOSE 92 123* 90   Ct Abdomen Pelvis W Contrast 11/10/2013   IMPRESSION: 1. Findings again consistent with provided history of Crohn's disease, with extensive distal small bowel inflammatory change. Most of the ileum is inflamed with matted loops of bowel and multiple interloop abscesses. There is adenopathy and there is concern for developing fistula. 2. Findings consistent with left ovarian ruptured hemorrhagic cyst 3. Sacroiliitis associated with inflammatory bowel disease.    Tommie SamsJayce G Kami Kube, DO 11/12/2013, 8:02 AM PGY-2, [facility] FPTS Intern pager: [phone], text pages welcome

## 2013-11-12 NOTE — Progress Notes (Signed)
Seen and examined.  Agree with Dr. Adriana Simas.  I am in no hurry to switch to PO antibiotics.  We will try to sterilize abscesses without drainage which is always dicey.  The good news is that she is feeling some better.

## 2013-11-13 LAB — CBC
HCT: 30.3 % — ABNORMAL LOW (ref 36.0–46.0)
HEMOGLOBIN: 9.5 g/dL — AB (ref 12.0–15.0)
MCH: 27.4 pg (ref 26.0–34.0)
MCHC: 31.4 g/dL (ref 30.0–36.0)
MCV: 87.3 fL (ref 78.0–100.0)
Platelets: 397 10*3/uL (ref 150–400)
RBC: 3.47 MIL/uL — ABNORMAL LOW (ref 3.87–5.11)
RDW: 14.9 % (ref 11.5–15.5)
WBC: 10.4 10*3/uL (ref 4.0–10.5)

## 2013-11-13 LAB — MAGNESIUM: Magnesium: 2 mg/dL (ref 1.5–2.5)

## 2013-11-13 LAB — BASIC METABOLIC PANEL
BUN: 5 mg/dL — ABNORMAL LOW (ref 6–23)
CO2: 26 mEq/L (ref 19–32)
Calcium: 8.8 mg/dL (ref 8.4–10.5)
Chloride: 103 mEq/L (ref 96–112)
Creatinine, Ser: 0.63 mg/dL (ref 0.50–1.10)
GFR calc non Af Amer: >90 mL/min (ref >90)
Glucose, Bld: 99 mg/dL (ref 70–99)
POTASSIUM: 3.5 meq/L — AB (ref 3.7–5.3)
Sodium: 140 mEq/L (ref 137–147)

## 2013-11-13 MED ORDER — POTASSIUM CHLORIDE 10 MEQ/100ML IV SOLN: 10.0000 meq | INTRAVENOUS | Status: AC

## 2013-11-13 MED ORDER — POTASSIUM CHLORIDE 10 MEQ/100ML IV SOLN
10.0000 meq | INTRAVENOUS | Status: DC
  Filled 2013-11-13: qty 100

## 2013-11-13 MED ORDER — POTASSIUM CHLORIDE CRYS ER 20 MEQ PO TBCR
40.0000 meq | EXTENDED_RELEASE_TABLET | Freq: Every day | ORAL | Status: DC
  Administered 2013-11-13 – 2013-11-17 (×4): 40 meq via ORAL
  Filled 2013-11-13 (×6): qty 2

## 2013-11-13 NOTE — Progress Notes (Signed)
Family Medicine Teaching Service Daily Progress Note Intern Pager: [phone]  Patient name: Gina LevanChrystal Gonzalez Medical record number: [id number] Date of birth: [birth year] Age: 33 y.o. Gender: female  Primary Care Provider: Kevin FentonBradshaw, Samuel, MD Consultants: GI, surgery Code Status: FULL  Pt Overview and Major Events to Date:  12/30: Admitted due to Crohn's Ileitis with abscesses and ?fistula in ileus, GI made aware 12/31: Surgery consulted; No surgery recommended unless patient worsens.  Abx switched to Zosyn. 12/31: GI consulted.  Continue Abx and Prednisone.   IR also consulted - abscesses not amendable to drainage.  11/12/13 - Tolerating clear liquids. Advanced diet to full liquid.  11/13/13 - Patient continuing to improve on Abx.  Assessment and Plan:  Gina Katrinka BlazingSmith is a 33 y.o. female presenting with likely Crohn's Disease flare with ileitis seen on CT scan. PMH is significant for Crohn's Disease diagnosed ~1.5 years ago with 1 prior hospitalization Sept 2013 with perforated small bowel.   # Crohn's Ileitis and multiple abscesses of the ileum: - Surgery consulted and patient has been evaluated.  Surgery stated that they would avoid surgery unless forced to by clinical picture/standpoint.  Will re-contact surgery if patient worsens/fails to improve clinically.  IR has also been contacted and abscesses not amendable to drainage.  - Abx: Zosyn (Day 3; total antibiotic course Day 4). - Pain control: Morphine IV 4 mg Q2H PRN. - Continuing full liquid diet today.  - GI Following; We appreciate their help in managing this patient.  Continuing prednisone 40mg  PO daily (Day 3).  - Will plan to repeat CT scan on Sunday (1/4)  # Leukocytosis - Likely secondary to prednisone use vs active Crohn's ileitis.  - Markedly improved. 10.4 this am.  - Abx as above - Will continue to trend via daily CBC.   # UA with many bacteria - Appears dehydrated with 15 ketones and small bili, also however has many  bacteria and small leukocytes, rare squamous cells, nitrite neg  - Patient asymptomatic, thus will not pursue further workup/treatment.   # Anemia - Likely hemodilutional after hemoconcentration with vomiting.  - Hb stable at 9.5 - Will continue to assess daily with CBC  FEN/GI: KVO IV fluids, clear liquid diet.  Prophylaxis: SQ heparin   Disposition: Discharge pending clinical improvement.  Re-consulting case management/SW to aid with lack of insurance and medication affordability.   Subjective:  Feeling better this am. Still having some abdominal pain and diarrhea/gas. No fevers, chills overnight.   Objective: Temp:  [98 F (36.7 C)-98.5 F (36.9 C)] 98.5 F (36.9 C) (01/02 0518) Pulse Rate:  [77-90] 77 (01/02 0518) Resp:  [16-19] 18 (01/02 0518) BP: (110-123)/(54-64) 110/54 mmHg (01/02 0518) SpO2:  [97 %-99 %] 97 % (01/02 0518)  Physical Exam: General: Well appearing female in NAD.  Cardiovascular: RRR, no m/r/g Respiratory: CTAB, no rales, rhonchi, or wheezing.  Abdomen: Soft, nondistended.  Mild-moderately tender to palpation in the RLQ.  Mass felt in RLQ. Hyperactive BS noted X 4.  Extremities: No edema noted.  Laboratory/Imaging:  Recent Labs Lab 11/11/13 0717 11/12/13 0500 11/13/13 0348  WBC 17.6* 15.5* 10.4  HGB 10.3* 9.5* 9.5*  HCT 32.8* 29.8* 30.3*  PLT 432* 397 397    Recent Labs Lab 11/10/13 1710 11/11/13 0717 11/12/13 0500 11/13/13 0348  NA 135* 136* 140 140  K 3.5* 4.1 3.1* 3.5*  CL 92* 98 99 103  CO2 28 25 24 26   BUN 10 6 6  5*  CREATININE 0.72 0.63 0.70 0.63  CALCIUM 9.0 8.7 8.6 8.8  PROT 7.8  --   --   --   BILITOT 0.6  --   --   --   ALKPHOS 89  --   --   --   ALT 14  --   --   --   AST 8  --   --   --   GLUCOSE 92 123* 90 99   Ct Abdomen Pelvis W Contrast 11/10/2013   IMPRESSION: 1. Findings again consistent with provided history of Crohn's disease, with extensive distal small bowel inflammatory change. Most of the ileum is  inflamed with matted loops of bowel and multiple interloop abscesses. There is adenopathy and there is concern for developing fistula. 2. Findings consistent with left ovarian ruptured hemorrhagic cyst 3. Sacroiliitis associated with inflammatory bowel disease.    Tommie Sams, DO 11/13/2013, 7:11 AM PGY-2, [facility] FPTS Intern pager: [phone], text pages welcome

## 2013-11-13 NOTE — Clinical Social Work Note (Signed)
CSW received consult for pt's self-pay status for health insurance. CSW has previously contacted financial services with [facility] on 11/11/2013 regarding pt being self-pay. Financial counselor stated that she would speak with pt regarding services available. CSW signing off. Please re consult if needed.  Darlyn Chamber, LCSWA Clinical Social Worker [phone]

## 2013-11-13 NOTE — Care Management (Addendum)
11-13-13 Revisited patient , to see if she had completed her application for medication assistance for [employer] given to her 11-11-13 . She has not  started application yet .   MD will also have to complete MD portion .   Once complete and proof of income provided , I can fax application to Drug Company .  Called Drug company spoke with Dennie Bible , once received turn around time is 2 days .  Dennie Bible stated currently there are no coupons for [employer] .   Can ask for over ride on [facility] program to provide 1 month assistance , but there must be a plan in place for after that month .   Provided patient with another application   Family Medicine aware.   Ronny Flurry RN BSN [phone]

## 2013-11-13 NOTE — Progress Notes (Signed)
Seen and examined.  Discussed with Dr. Adriana Simas.  Our plan is: Continue full liquids and antibiotics until Sun 1/4. Repeat CT scan of abd 1/4. Pending CT result and clinical improvement consider switch to PO antibiotics and advance diet post CT. If all goes very well, consider home Mon 1/5. Affording home meds hopefully can be worked out before DC.

## 2013-11-13 NOTE — Progress Notes (Signed)
Subjective: Feeling better, but she does have diarrhea.  On a baseline she will have multiple bowel movements per day.  Objective: Vital signs in last 24 hours: Temp:  [98 F (36.7 C)-98.5 F (36.9 C)] 98.5 F (36.9 C) (01/02 0518) Pulse Rate:  [77-90] 77 (01/02 0518) Resp:  [16-19] 18 (01/02 0518) BP: (110-123)/(54-64) 110/54 mmHg (01/02 0518) SpO2:  [97 %-99 %] 97 % (01/02 0518) Last BM Date: 11/12/13  Intake/Output from previous day: 01/01 0701 - 01/02 0700 In: 1730 [P.O.:1080; IV Piggyback:650] Out: -  Intake/Output this shift:    General appearance: alert and no distress GI: minimal RLQ tenderness  Lab Results:  Recent Labs  11/11/13 0717 11/12/13 0500 11/13/13 0348  WBC 17.6* 15.5* 10.4  HGB 10.3* 9.5* 9.5*  HCT 32.8* 29.8* 30.3*  PLT 432* 397 397   BMET  Recent Labs  11/11/13 0717 11/12/13 0500 11/13/13 0348  NA 136* 140 140  K 4.1 3.1* 3.5*  CL 98 99 103  CO2 25 24 26   GLUCOSE 123* 90 99  BUN 6 6 5*  CREATININE 0.63 0.70 0.63  CALCIUM 8.7 8.6 8.8   LFT  Recent Labs  11/10/13 1710  PROT 7.8  ALBUMIN 3.2*  AST 8  ALT 14  ALKPHOS 89  BILITOT 0.6   PT/INR No results found for this basename: LABPROT, INR,  in the last 72 hours Hepatitis Panel No results found for this basename: HEPBSAG, HCVAB, HEPAIGM, HEPBIGM,  in the last 72 hours C-Diff No results found for this basename: CDIFFTOX,  in the last 72 hours Fecal Lactopherrin No results found for this basename: FECLLACTOFRN,  in the last 72 hours  Studies/Results: No results found.  Medications:  Scheduled: . heparin  5,000 Units Subcutaneous Q8H  . piperacillin-tazobactam (ZOSYN)  IV  3.375 g Intravenous Q8H  . potassium chloride  10 mEq Intravenous Q1 Hr x 4  . predniSONE  40 mg Oral Q breakfast   Continuous: . sodium chloride 20 mL/hr (11/12/13 0827)    Assessment/Plan: 1) Presumed Crohn's disease. 2) Interloop abscesses.   The patient is much better clinically.  She has  responded to steroids as well as antibiotics.  Insurance is an issue for her.  I do believe she has Crohn's disease, but she cannot be started on a biologic agent without a proper diagnosis.  She told me that Social Work has seen her, but her options are limited.  Plan: 1) Continue with steroids. 2) Continue with antibiotics. 3) ? Repeat CT scan in the next few days to determine the status of the abscesses.   LOS: 3 days   Kahli Mayon D 11/13/2013, 7:43 AM

## 2013-11-14 LAB — CBC
HEMATOCRIT: 30.4 % — AB (ref 36.0–46.0)
Hemoglobin: 9.3 g/dL — ABNORMAL LOW (ref 12.0–15.0)
MCH: 26.9 pg (ref 26.0–34.0)
MCHC: 30.6 g/dL (ref 30.0–36.0)
MCV: 87.9 fL (ref 78.0–100.0)
Platelets: 421 10*3/uL — ABNORMAL HIGH (ref 150–400)
RBC: 3.46 MIL/uL — ABNORMAL LOW (ref 3.87–5.11)
RDW: 15.1 % (ref 11.5–15.5)
WBC: 10.7 10*3/uL — ABNORMAL HIGH (ref 4.0–10.5)

## 2013-11-14 LAB — BASIC METABOLIC PANEL
BUN: 4 mg/dL — AB (ref 6–23)
CHLORIDE: 103 meq/L (ref 96–112)
CO2: 26 mEq/L (ref 19–32)
CREATININE: 0.72 mg/dL (ref 0.50–1.10)
Calcium: 9 mg/dL (ref 8.4–10.5)
GFR calc non Af Amer: >90 mL/min (ref >90)
GLUCOSE: 80 mg/dL (ref 70–99)
POTASSIUM: 3.2 meq/L — AB (ref 3.7–5.3)
Sodium: 142 mEq/L (ref 137–147)

## 2013-11-14 MED ORDER — POTASSIUM CHLORIDE CRYS ER 20 MEQ PO TBCR
40.0000 meq | EXTENDED_RELEASE_TABLET | Freq: Two times a day (BID) | ORAL | Status: AC
  Administered 2013-11-14 (×2): 40 meq via ORAL
  Filled 2013-11-14: qty 2

## 2013-11-14 NOTE — Progress Notes (Signed)
Family Medicine Teaching Service Daily Progress Note Intern Pager: [phone]  Patient name: Gina LevanChrystal Gonzalez Medical record number: [id number] Date of birth: February 10, 1981 Age: 33 y.o. Gender: female  Primary Care Provider: Kevin FentonBradshaw, Samuel, MD Consultants: GI, surgery Code Status: FULL  Pt Overview and Major Events to Date:  12/30: Admitted due to Crohn's Ileitis with abscesses and ?fistula in ileus, GI made aware 12/31: Surgery consulted; No surgery recommended unless patient worsens.  Abx switched to Zosyn. 12/31: GI consulted.  Continue Abx and Prednisone.   IR also consulted - abscesses not amendable to drainage.  11/12/13 - Tolerating clear liquids. Advanced diet to full liquid.  11/13/13 - Patient continuing to improve on Abx.  Assessment and Plan:  Gina Gonzalez is a 33 y.o. female presenting with likely Crohn's Disease flare with ileitis seen on CT scan. PMH is significant for Crohn's Disease diagnosed ~1.5 years ago with 1 prior hospitalization Sept 2013 with perforated small bowel.   # Crohn's Ileitis and multiple abscesses of the ileum: - Surgery consulted and patient has been evaluated.  Surgery stated that they would avoid surgery unless forced to by clinical picture/standpoint.  Will re-contact surgery if patient worsens/fails to improve clinically.  IR has also been contacted and abscesses not amenable to drainage.  - Abx: Zosyn (Day 4; total antibiotic course Day 5). - Pain control: Morphine IV 4 mg Q2H PRN. - Continuing full liquid diet today.  - GI Following; We appreciate their help in managing this patient.  Continuing prednisone 40mg  PO daily (Day 4).  - Will plan to repeat CT scan on Sunday (1/4)  # Leukocytosis - Likely secondary to prednisone use vs active Crohn's ileitis.  - Markedly improved. 10.7 this am.  - Abx as above - Will continue to trend via daily CBC.   # UA with many bacteria - Appears dehydrated with 15 ketones and small bili, also however has many  bacteria and small leukocytes, rare squamous cells, nitrite neg  - Patient asymptomatic, thus will not pursue further workup/treatment.   # Anemia - Likely hemodilutional after hemoconcentration with vomiting.  - Hb stable at 9.3 - Will continue to assess daily with CBC  # Hypokalemia: 3.2 today. Likely related to diminished diet. -will replete with PO kdur 40 mEq x2 doses.  FEN/GI: KVO IV fluids, full liquid diet.  Prophylaxis: SQ heparin   Disposition: Discharge pending clinical improvement.  Re-consulting case management/SW to aid with lack of insurance and medication affordability.   Subjective:  Continues to feel better. No complaints of abdominal pain. States had episode of back pain last night, though this resolved with ibuprofen.  Objective: Temp:  [98.1 F (36.7 C)-98.4 F (36.9 C)] 98.1 F (36.7 C) (01/03 0536) Pulse Rate:  [67-82] 69 (01/03 0536) Resp:  [17-18] 17 (01/03 0536) BP: (101-128)/(47-60) 101/47 mmHg (01/03 0536) SpO2:  [98 %-100 %] 99 % (01/03 0536)  Physical Exam: General: Well appearing female in NAD.  Cardiovascular: RRR, no m/r/g Respiratory: CTAB, no rales, rhonchi, or wheezing.  Abdomen: Soft, nondistended.  Mild tender to palpation in the RLQ. +BS.  Extremities: No edema noted.  Laboratory/Imaging:  Recent Labs Lab 11/12/13 0500 11/13/13 0348 11/14/13 0520  WBC 15.5* 10.4 10.7*  HGB 9.5* 9.5* 9.3*  HCT 29.8* 30.3* 30.4*  PLT 397 397 421*    Recent Labs Lab 11/10/13 1710  11/12/13 0500 11/13/13 0348 11/14/13 0520  NA 135*  < > 140 140 142  K 3.5*  < > 3.1* 3.5* 3.2*  CL  92*  < > 99 103 103  CO2 28  < > 24 26 26   BUN 10  < > 6 5* 4*  CREATININE 0.72  < > 0.70 0.63 0.72  CALCIUM 9.0  < > 8.6 8.8 9.0  PROT 7.8  --   --   --   --   BILITOT 0.6  --   --   --   --   ALKPHOS 89  --   --   --   --   ALT 14  --   --   --   --   AST 8  --   --   --   --   GLUCOSE 92  < > 90 99 80  < > = values in this interval not displayed. Ct  Abdomen Pelvis W Contrast 11/10/2013   IMPRESSION: 1. Findings again consistent with provided history of Crohn's disease, with extensive distal small bowel inflammatory change. Most of the ileum is inflamed with matted loops of bowel and multiple interloop abscesses. There is adenopathy and there is concern for developing fistula. 2. Findings consistent with left ovarian ruptured hemorrhagic cyst 3. Sacroiliitis associated with inflammatory bowel disease.    Glori Luis, MD 11/14/2013, 11:32 AM PGY-2, [place] [facility] FPTS Intern pager: [phone], text pages welcome

## 2013-11-14 NOTE — Progress Notes (Signed)
FMTS Attending Daily Note:  Renold Don MD  [phone] pager  Family Practice pager:  [phone] I have seen and examined this patient and have reviewed their chart. I have discussed this patient with the resident. I agree with the resident's findings, assessment and care plan.  Additionally:  Appreciate GI/surgery input.  Awaiting CT scan results tomorrow.  Currently doing well, still minimal pain and some involuntary guarding on abdominal exam.  Full liquid diet currently.    Tobey Grim, MD 11/14/2013 2:19 PM

## 2013-11-14 NOTE — Progress Notes (Signed)
    Progress Note   Subjective  No significant abdominal pain. Still has frequent loose stools, they are more loose than when at home.    Objective   Vital signs in last 24 hours: Temp:  [98.1 F (36.7 C)] 98.1 F (36.7 C) (01/03 0536) Pulse Rate:  [67-69] 69 (01/03 0536) Resp:  [17] 17 (01/03 0536) BP: (101-110)/(47-60) 101/47 mmHg (01/03 0536) SpO2:  [99 %-100 %] 99 % (01/03 0536) Last BM Date: 11/14/13 General:   pleasant white female in NAD Heart:  Regular rate and rhythm Abdomen:  Soft, nontender and nondistended. Normal bowel sounds. Neurologic:  Alert and oriented,  grossly normal neurologically. Psych:  Cooperative. Normal mood and affect.  Lab Results:  Recent Labs  11/12/13 0500 11/13/13 0348 11/14/13 0520  WBC 15.5* 10.4 10.7*  HGB 9.5* 9.5* 9.3*  HCT 29.8* 30.3* 30.4*  PLT 397 397 421*   BMET  Recent Labs  11/12/13 0500 11/13/13 0348 11/14/13 0520  NA 140 140 142  K 3.1* 3.5* 3.2*  CL 99 103 103  CO2 24 26 26   GLUCOSE 90 99 80  BUN 6 5* 4*  CREATININE 0.70 0.63 0.72  CALCIUM 8.6 8.8 9.0     Assessment / Plan:   33 year old female with severe crohn's ileitis by CTscan (never had colonoscopy / biopsy) with abscess and ? fistula. Abscesses not amenable to drainage per IR.  Patient feels she has improved significantly. Has frequent loose stools but pain much better. Tolerating fulls. On Zosyn and 40mg  of prednisone. Continue present course. May need repeat CTscan in next couple of days for re-evaluation of abscesses. Financial counselor to talk with patient about any available services as patient doesn't have insurance but will require outpatient medical treatment for crohn's.   LOS: 4 days   Willette Cluster  11/14/2013, 2:18 PM   Agree with Ms. Guenther's assessment and plan. Iva Boop, MD, [facility]

## 2013-11-15 ENCOUNTER — Inpatient Hospital Stay (HOSPITAL_COMMUNITY): Payer: Self-pay

## 2013-11-15 LAB — BASIC METABOLIC PANEL
BUN: 4 mg/dL — AB (ref 6–23)
CO2: 25 mEq/L (ref 19–32)
CREATININE: 0.79 mg/dL (ref 0.50–1.10)
Calcium: 9.1 mg/dL (ref 8.4–10.5)
Chloride: 103 mEq/L (ref 96–112)
GFR calc Af Amer: >90 mL/min (ref >90)
Glucose, Bld: 79 mg/dL (ref 70–99)
POTASSIUM: 3.6 meq/L — AB (ref 3.7–5.3)
Sodium: 140 mEq/L (ref 137–147)

## 2013-11-15 LAB — CBC
HCT: 31.4 % — ABNORMAL LOW (ref 36.0–46.0)
HEMOGLOBIN: 9.6 g/dL — AB (ref 12.0–15.0)
MCH: 26.8 pg (ref 26.0–34.0)
MCHC: 30.6 g/dL (ref 30.0–36.0)
MCV: 87.7 fL (ref 78.0–100.0)
Platelets: 425 10*3/uL — ABNORMAL HIGH (ref 150–400)
RBC: 3.58 MIL/uL — ABNORMAL LOW (ref 3.87–5.11)
RDW: 15 % (ref 11.5–15.5)
WBC: 10 10*3/uL (ref 4.0–10.5)

## 2013-11-15 MED ORDER — PANTOPRAZOLE SODIUM 40 MG PO TBEC
40.0000 mg | DELAYED_RELEASE_TABLET | Freq: Every day | ORAL | Status: DC
  Administered 2013-11-15 – 2013-11-17 (×3): 40 mg via ORAL
  Filled 2013-11-15 (×3): qty 1

## 2013-11-15 MED ORDER — IOHEXOL 300 MG/ML  SOLN
25.0000 mL | INTRAMUSCULAR | Status: AC
  Administered 2013-11-15 (×2): 25 mL via ORAL

## 2013-11-15 MED ORDER — ACETAMINOPHEN 325 MG PO TABS
650.0000 mg | ORAL_TABLET | Freq: Four times a day (QID) | ORAL | Status: DC | PRN
  Administered 2013-11-15: 650 mg via ORAL
  Filled 2013-11-15: qty 2

## 2013-11-15 MED ORDER — IOHEXOL 300 MG/ML  SOLN
100.0000 mL | Freq: Once | INTRAMUSCULAR | Status: AC | PRN
  Administered 2013-11-15: 100 mL via INTRAVENOUS

## 2013-11-15 NOTE — Progress Notes (Signed)
FMTS Attending Daily Note:  Renold DonJeff Hoa Deriso MD  [phone] pager  Family Practice pager:  [phone] I have discussed this patient with the resident Dr. Birdie SonsSonnenberg.  I agree with their findings, assessment, and care plan.  Patient continues to improve.  Await repeat CT results.

## 2013-11-15 NOTE — Progress Notes (Signed)
Family Medicine Teaching Service Daily Progress Note Intern Pager: [phone]  Patient name: Gina LevanChrystal Gonzalez Medical record number: [id number] Date of birth: 1981/01/22 Age: 33 y.o. Gender: female  Primary Care Provider: Kevin FentonBradshaw, Samuel, MD Consultants: GI, surgery Code Status: FULL  Pt Overview and Major Events to Date:  12/30: Admitted due to Crohn's Ileitis with abscesses and ?fistula in ileus, GI made aware 12/31: Surgery consulted; No surgery recommended unless patient worsens.  Abx switched to Zosyn. 12/31: GI consulted.  Continue Abx and Prednisone.   IR also consulted - abscesses not amendable to drainage.  11/12/13 - Tolerating clear liquids. Advanced diet to full liquid.  11/13/13 - Patient continuing to improve on Abx.  Assessment and Plan:  Gina Gonzalez is a 33 y.o. female presenting with likely Crohn's Disease flare with ileitis seen on CT scan. PMH is significant for Crohn's Disease diagnosed ~1.5 years ago with 1 prior hospitalization Sept 2013 with perforated small bowel.   # Crohn's Ileitis and multiple abscesses of the ileum: - Surgery consulted and patient has been evaluated.  Surgery stated that they would avoid surgery unless forced to by clinical picture/standpoint.  Will re-contact surgery if patient worsens/fails to improve clinically.  IR has also been contacted and abscesses not amenable to drainage.  - Abx: Zosyn (Day 5; total antibiotic course Day 6). - Pain control: last dose of morphine was >48 hours ago, pain has been controlled on prn ibuprofen. Will d/c morphine today. Given GI issues will place on tylenol for pain and d/c ibuprofen. - will also start on protonix for GI prophylaxis. - Continuing full liquid diet today. With consideration of advancing to softs following CT scan. - GI Following; We appreciate their help in managing this patient.  Continuing prednisone 40mg  PO daily (Day 5).  - Will plan to repeat CT scan today  # Leukocytosis - Likely secondary  to prednisone use vs active Crohn's ileitis.  - Markedly improved. 10 this am.  - Abx as above - Will continue to trend via daily CBC.   # UA with many bacteria - Appears dehydrated with 15 ketones and small bili, also however has many bacteria and small leukocytes, rare squamous cells, nitrite neg  - Patient asymptomatic, thus will not pursue further workup/treatment.   # Anemia - Likely hemodilutional after hemoconcentration with vomiting.  - Hb stable at 9.3 - Will continue to assess daily with CBC  # Hypokalemia: 3.6 today. Likely related to diminished diet. -will replete with PO kdur 40 mEq x1 dose.  FEN/GI: KVO IV fluids, full liquid diet.  Prophylaxis: SQ heparin   Disposition: Discharge pending clinical improvement.  Re-consulting case management/SW to aid with lack of insurance and medication affordability.   Subjective:  Continues to feel better. No complaints of abdominal pain. Notes having an appetite today.  Objective: Temp:  [97.7 F (36.5 C)-98.3 F (36.8 C)] 97.7 F (36.5 C) (01/04 0518) Pulse Rate:  [58-74] 58 (01/04 0518) Resp:  [19-20] 19 (01/04 0518) BP: (108-115)/(58-59) 108/58 mmHg (01/04 0518) SpO2:  [99 %-100 %] 99 % (01/04 0518)  Physical Exam: General: Well appearing female in NAD.  Cardiovascular: RRR, no m/r/g Respiratory: CTAB, no rales, rhonchi, or wheezing.  Abdomen: Soft, nondistended.  nontender. +BS.  Extremities: No edema noted.  Laboratory/Imaging:  Recent Labs Lab 11/13/13 0348 11/14/13 0520 11/15/13 0544  WBC 10.4 10.7* 10.0  HGB 9.5* 9.3* 9.6*  HCT 30.3* 30.4* 31.4*  PLT 397 421* 425*    Recent Labs Lab 11/10/13 1710  11/13/13 0348 11/14/13 0520 11/15/13 0544  NA 135*  < > 140 142 140  K 3.5*  < > 3.5* 3.2* 3.6*  CL 92*  < > 103 103 103  CO2 28  < > 26 26 25   BUN 10  < > 5* 4* 4*  CREATININE 0.72  < > 0.63 0.72 0.79  CALCIUM 9.0  < > 8.8 9.0 9.1  PROT 7.8  --   --   --   --   BILITOT 0.6  --   --   --   --    ALKPHOS 89  --   --   --   --   ALT 14  --   --   --   --   AST 8  --   --   --   --   GLUCOSE 92  < > 99 80 79  < > = values in this interval not displayed. Ct Abdomen Pelvis W Contrast 11/10/2013   IMPRESSION: 1. Findings again consistent with provided history of Crohn's disease, with extensive distal small bowel inflammatory change. Most of the ileum is inflamed with matted loops of bowel and multiple interloop abscesses. There is adenopathy and there is concern for developing fistula. 2. Findings consistent with left ovarian ruptured hemorrhagic cyst 3. Sacroiliitis associated with inflammatory bowel disease.    Glori Luis, MD 11/15/2013, 8:04 AM PGY-2, [facility] FPTS Intern pager: [phone], text pages welcome

## 2013-11-16 LAB — CBC
HCT: 34.3 % — ABNORMAL LOW (ref 36.0–46.0)
Hemoglobin: 10.6 g/dL — ABNORMAL LOW (ref 12.0–15.0)
MCH: 27.1 pg (ref 26.0–34.0)
MCHC: 30.9 g/dL (ref 30.0–36.0)
MCV: 87.7 fL (ref 78.0–100.0)
PLATELETS: 434 10*3/uL — AB (ref 150–400)
RBC: 3.91 MIL/uL (ref 3.87–5.11)
RDW: 15.3 % (ref 11.5–15.5)
WBC: 8.6 10*3/uL (ref 4.0–10.5)

## 2013-11-16 LAB — BASIC METABOLIC PANEL
BUN: 5 mg/dL — ABNORMAL LOW (ref 6–23)
CALCIUM: 9.3 mg/dL (ref 8.4–10.5)
CO2: 27 mEq/L (ref 19–32)
Chloride: 101 mEq/L (ref 96–112)
Creatinine, Ser: 0.87 mg/dL (ref 0.50–1.10)
GFR calc non Af Amer: 88 mL/min — ABNORMAL LOW (ref >90)
GLUCOSE: 82 mg/dL (ref 70–99)
POTASSIUM: 3.7 meq/L (ref 3.7–5.3)
SODIUM: 142 meq/L (ref 137–147)

## 2013-11-16 MED ORDER — SODIUM CHLORIDE 0.9 % IV SOLN
INTRAVENOUS | Status: DC
  Administered 2013-11-16: 15:00:00 via INTRAVENOUS
  Administered 2013-11-17: 500 mL via INTRAVENOUS

## 2013-11-16 MED ORDER — PEG 3350-KCL-NA BICARB-NACL 420 G PO SOLR
4000.0000 mL | Freq: Once | ORAL | Status: AC
  Administered 2013-11-16: 4000 mL via ORAL
  Filled 2013-11-16: qty 4000

## 2013-11-16 NOTE — Progress Notes (Signed)
Family Medicine Teaching Service Daily Progress Note Intern Pager: [phone]  Patient name: Gina Gonzalez Medical record number: [id number] Date of birth: July 13, 1981 Age: 33 y.o. Gender: female  Primary Care Provider: Kevin Fenton, MD Consultants: GI, surgery Code Status: FULL  Pt Overview and Major Events to Date:  12/30: Admitted due to Crohn's Ileitis with abscesses and ?fistula in ileus, GI made aware 12/31: Surgery consulted; No surgery recommended unless patient worsens.  Abx switched to Zosyn. 12/31: GI consulted.  Continue Abx and Prednisone.   IR also consulted - abscesses not amendable to drainage.  11/12/13 - Tolerating clear liquids. Advanced diet to full liquid.  11/13/13 - Patient continuing to improve on Abx.  Assessment and Plan: Gina Gonzalez is a 33 y.o. female presenting with likely Crohn's Disease flare with ileitis seen on CT scan. PMH is significant for Crohn's Disease diagnosed ~1.5 years ago with 1 prior hospitalization Sept 2013 with perforated small bowel.   # Crohn's Ileitis and multiple abscesses of the ileum: - Eating a full diet today. Experienced reflux last night after eating canned peaches.  - Abx: Zosyn (Day 6/6); transition to PO tomorrow.  - Pain control: tylenol  -  protonix  - GI Following: Improvement of her abscesses on CT, will proceed w/ Colonoscopy tomorrow.  Continuing prednisone 40mg  PO daily (Day 6).   # Leukocytosis - Resolved  - Abx as above - Will continue to trend via daily CBC.   # UA with many bacteria - Asymptomatic   # Anemia - Trending up. Stable  -  daily CBC  # Hypokalemia: Improved.  - 3.6>3.7  FEN/GI: KVO IV fluids, full diet but will become NPO with clean out  Prophylaxis: SQ heparin   Disposition: Discharge pending clinical improvement.  Re-consulting case management/SW to aid with lack of insurance and medication affordability.   Subjective:  She feels like she is getting back to normal. She ate some peaches last  night that caused some reflux. This was resolved with tylenol.   Objective: Temp:  [97.6 F (36.4 C)] 97.6 F (36.4 C) (01/05 0629) Pulse Rate:  [56-67] 56 (01/05 0629) Resp:  [18] 18 (01/05 0629) BP: (105-127)/(54-63) 108/57 mmHg (01/05 0629) SpO2:  [98 %-100 %] 100 % (01/05 0272)  Physical Exam: General: Well appearing female in NAD.  Cardiovascular: RRR, no m/r/g Respiratory: CTAB, no rales, rhonchi, or wheezing.  Abdomen: Soft, nondistended.  nontender. +BS.  Extremities: No edema noted.  Laboratory/Imaging:  Recent Labs Lab 11/14/13 0520 11/15/13 0544 11/16/13 0537  WBC 10.7* 10.0 8.6  HGB 9.3* 9.6* 10.6*  HCT 30.4* 31.4* 34.3*  PLT 421* 425* 434*    Recent Labs Lab 11/10/13 1710  11/14/13 0520 11/15/13 0544 11/16/13 0537  NA 135*  < > 142 140 142  K 3.5*  < > 3.2* 3.6* 3.7  CL 92*  < > 103 103 101  CO2 28  < > 26 25 27   BUN 10  < > 4* 4* 5*  CREATININE 0.72  < > 0.72 0.79 0.87  CALCIUM 9.0  < > 9.0 9.1 9.3  PROT 7.8  --   --   --   --   BILITOT 0.6  --   --   --   --   ALKPHOS 89  --   --   --   --   ALT 14  --   --   --   --   AST 8  --   --   --   --  GLUCOSE 92  < > 80 79 82  < > = values in this interval not displayed.  Ct Abdomen Pelvis W Contrast  11/10/2013   IMPRESSION: 1. Findings again consistent with provided history of Crohn's disease, with extensive distal small bowel inflammatory change. Most of the ileum is inflamed with matted loops of bowel and multiple interloop abscesses. There is adenopathy and there is concern for developing fistula. 2. Findings consistent with left ovarian ruptured hemorrhagic cyst 3. Sacroiliitis associated with inflammatory bowel disease.    Myra RudeJeremy E Schmitz, MD 11/16/2013, 9:12 AM PGY-1, [facility] FPTS Intern pager: [phone], text pages welcome

## 2013-11-16 NOTE — Discharge Summary (Signed)
Family Medicine Teaching [facility] Discharge Summary  Patient name: Gina LevanChrystal Gonzalez Medical record number: [id number] Date of birth: [birth year] Age: 33 y.o. Gender: female Date of Admission: 11/10/2013  Date of Discharge: 11/17/12 Admitting Physician: Janit PaganKehinde Eniola, MD  Primary Care Provider: Kevin FentonBradshaw, Samuel, MD Consultants: GI, Surgery   Indication for Hospitalization: Severe Abdominal pain   Discharge Diagnoses/Problem List:  Patient Active Problem List   Diagnosis Date Noted  . Crohn's colitis 11/10/2013  . Leukocytosis, unspecified 11/10/2013  . Intestinal abscess 11/10/2013  . Crohn's disease of ileum 10/23/2013  . Abdominal  pain, other specified site 10/23/2013  . Crohn's disease 08/15/2012  . Anemia 08/15/2012  . Abdominal pain, acute, bilateral lower quadrant 07/30/2012  . Fever 07/30/2012  . Perforated small intestine 07/29/2012    Disposition: stable  Discharge Condition: improved   Discharge Exam:  General: Well appearing female in NAD.  Cardiovascular: RRR, no m/r/g  Respiratory: CTAB, no rales, rhonchi, or wheezing.  Abdomen: Soft, nondistended. nontender. +BS.  Extremities: No edema noted.  Brief Hospital Course:  Gina Gonzalez is a 33 y.o. female presenting with likely Crohn's Disease flare with ileitis seen on CT scan. PMH is significant for Crohn's Disease diagnosed ~1.5 years ago with 1 prior hospitalization Sept 2013 with perforated small bowel.   # Crohn's Ileitis and multiple abscesses of the ileum: Patient has a history of Crohn's disease with ileitis seen on CT scan and reported history of worsening episodic abdominal pain. There was concern for abscess and fistula on CT, but there was nonobstructive gas pattern. A FOBT was negative.  She was continued on Prednisone 40 mg PO daily. Started on Ciprofloxacin and flagyl for ileitis, with transition to Zosyn on 12/31 for better penetration given multiple intra-abdominal abscesses.  Surgery thought  that the intraloop abscesses are not amenable to IR drainage and continuation of IV antibiotics.  GI agreed with prednisone and antibiotics but 5-ASA was not appropriate at that time.  Patient's pain had much improved and tolerating a regular diet on 1/5. She completed a six day course of Zosyn on 1/5.  Colonoscopy was performed on 1/6 that had multiple biopsies taken. She was discharged on Flagyl 500 mg TID for a 10 day course.  She had a prescription for prednisone at the pharmacy and directed to take as prescribed. She was not started on Humira on the advice of Dr. Elnoria HowardHung (GI) due to her abscesses. He will start Humira once the abscesses are resolved.   # Leukocytosis - Thought the be secondary to prednisone use versus active Crohn's disease.   # UA with many bacteria - Initially appeared dehydrated on UA with ketones and small bilirubin with many bacteria and small leukocytes, but nitrite negative. Patient was asymptomatic so treatment was not warranted.   # Anemia - Hemoglobin was stable and thought to be hemodiluted or hemoconcentrated due to her emesis.   # Hypokalemia: Noticed to be 3.1 and repleted as needed.   #Social: Patient unable to afford Mesalamine. She is not able to make an appointment with GI due to lack of insurance.  Care management consulted to find resources for her to help pay for Pentasa.  She was given a humira discount application to be filled out by GI.    Issues for Follow Up:  1. Crohn's disease: continuing ABX use and prednisone. Able to tolerate PO. F/u with GI confirmed (Dr. Elnoria HowardHung). Biopsy results from colonoscopy 1/6.  2. If she has started the process of getting insurance.  Significant Procedures: Colonoscopy   Significant Labs and Imaging:   Recent Labs Lab 11/15/13 0544 11/16/13 0537 11/17/13 0535  WBC 10.0 8.6 9.7  HGB 9.6* 10.6* 10.4*  HCT 31.4* 34.3* 33.0*  PLT 425* 434* 439*    Recent Labs Lab 11/12/13 0500 11/13/13 0348 11/14/13 0520  11/15/13 0544 11/16/13 0537 11/17/13 0535  NA 140 140 142 140 142 138  K 3.1* 3.5* 3.2* 3.6* 3.7 3.6*  CL 99 103 103 103 101 99  CO2 24 26 26 25 27 24   GLUCOSE 90 99 80 79 82 75  BUN 6 5* 4* 4* 5* 6  CREATININE 0.70 0.63 0.72 0.79 0.87 0.72  CALCIUM 8.6 8.8 9.0 9.1 9.3 9.5  MG  --  2.0  --   --   --   --     Ct Abdomen Pelvis W Contrast  11/10/2013 IMPRESSION: 1. Findings again consistent with provided history of Crohn's disease, with extensive distal small bowel inflammatory change. Most of the ileum is inflamed with matted loops of bowel and multiple interloop abscesses. There is adenopathy and there is concern for developing fistula. 2. Findings consistent with left ovarian ruptured hemorrhagic cyst 3. Sacroiliitis associated with inflammatory bowel disease.    Results/Tests Pending at Time of Discharge: Colonoscopy biopsy results.   Discharge Medications:    Medication List    STOP taking these medications       mesalamine 500 MG CR capsule  Commonly known as:  PENTASA      TAKE these medications       BEANO PO  Take 1 tablet by mouth 2 (two) times daily as needed (gas).     HYDROcodone-acetaminophen 10-325 MG per tablet  Commonly known as:  NORCO  Take 0.5-1 tablets by mouth 4 (four) times daily as needed (pain).     ibuprofen 200 MG tablet  Commonly known as:  ADVIL,MOTRIN  Take 800-1,000 mg by mouth 2 (two) times daily as needed (daily). For pain     metroNIDAZOLE 500 MG tablet  Commonly known as:  FLAGYL  Take 1 tablet (500 mg total) by mouth 3 (three) times daily.     predniSONE 20 MG tablet  Commonly known as:  DELTASONE  Take 20-40 mg by mouth daily with breakfast. As directed by md     promethazine 12.5 MG tablet  Commonly known as:  PHENERGAN  Take 1 tablet (12.5 mg total) by mouth every 8 (eight) hours as needed for nausea or vomiting.        Discharge Instructions: Please refer to Patient Instructions section of EMR for full details.  Patient  was counseled important signs and symptoms that should prompt return to medical care, changes in medications, dietary instructions, activity restrictions, and follow up appointments.   Follow-Up Appointments: Follow-up Information   Follow up with Kevin Fenton, MD On 11/20/2013. (9:30)    Specialty:  Family Medicine   Contact information:   [address] [place] [address] [phone]       Myra Rude, MD 11/18/2013, 5:58 PM PGY-1, [facility] Family Medicine

## 2013-11-16 NOTE — Progress Notes (Signed)
Subjective: She feels great and back to normal.  Objective: Vital signs in last 24 hours: Temp:  [97.6 F (36.4 C)] 97.6 F (36.4 C) (01/05 0629) Pulse Rate:  [56-67] 56 (01/05 0629) Resp:  [18] 18 (01/05 0629) BP: (105-127)/(54-63) 108/57 mmHg (01/05 0629) SpO2:  [98 %-100 %] 100 % (01/05 0629) Last BM Date: 11/15/13  Intake/Output from previous day: 01/04 0701 - 01/05 0700 In: 979.7 [I.V.:629.7; IV Piggyback:350] Out: -  Intake/Output this shift:    General appearance: alert and no distress GI: soft, non-tender; bowel sounds normal; no masses,  no organomegaly  Lab Results:  Recent Labs  11/14/13 0520 11/15/13 0544 11/16/13 0537  WBC 10.7* 10.0 8.6  HGB 9.3* 9.6* 10.6*  HCT 30.4* 31.4* 34.3*  PLT 421* 425* 434*   BMET  Recent Labs  11/14/13 0520 11/15/13 0544 11/16/13 0537  NA 142 140 142  K 3.2* 3.6* 3.7  CL 103 103 101  CO2 26 25 27   GLUCOSE 80 79 82  BUN 4* 4* 5*  CREATININE 0.72 0.79 0.87  CALCIUM 9.0 9.1 9.3   LFT No results found for this basename: PROT, ALBUMIN, AST, ALT, ALKPHOS, BILITOT, BILIDIR, IBILI,  in the last 72 hours PT/INR No results found for this basename: LABPROT, INR,  in the last 72 hours Hepatitis Panel No results found for this basename: HEPBSAG, HCVAB, HEPAIGM, HEPBIGM,  in the last 72 hours C-Diff No results found for this basename: CDIFFTOX,  in the last 72 hours Fecal Lactopherrin No results found for this basename: FECLLACTOFRN,  in the last 72 hours  Studies/Results: Ct Abdomen Pelvis W Contrast  11/15/2013   CLINICAL DATA:  Crohn's ileitis. Evaluate abscess or fluid collections in the abdomen.  EXAM: CT ABDOMEN AND PELVIS WITH CONTRAST  TECHNIQUE: Multidetector CT imaging of the abdomen and pelvis was performed using the standard protocol following bolus administration of intravenous contrast.  CONTRAST:  OMNIPAQUE IOHEXOL 300 MG/ML  SOLN  COMPARISON:  11/10/2013  FINDINGS: Lung bases are clear. There is some  motion artifact at the lung bases.  Normal appearance of the liver, gallbladder and portal venous system. Normal appearance of the pancreas, adrenal glands and both kidneys. Again noted are small fluid or cystic structures along the superior aspect of the spleen which have not significantly changed.  There has been enlargement of a cyst in the left ovary that now measures up to 4 cm and previously measured 2.8 cm. There is decreased of free fluid in the pelvis. No gross abnormality to the right ovary. Uterus is slightly heterogeneous but may be related to the phase of imaging. Urinary bladder is decompressed.  Again noted is extensive inflammation involving the distal small bowel loops. Findings remain compatible with ileitis. Diffuse wall thickening in the terminal ileum. Again noted is a fluid collection just below the aortic bifurcation that measures 2.9 x 4.7 cm and previously measured 3.3 x 5.0 cm. There is no significant gas within this collection. The ileocolic lymph nodes are prominent. Again noted is a small low-density collection in the right lower abdomen on sequence 2, image 59. This collection now measures 1.9 x 1.7 cm and previously measured 3.1 x 2.5 cm. There is no evidence for bowel dilatation or obstruction.  No acute bone abnormality. There is sclerosis around the SI joints bilaterally and findings can be associated with inflammatory bowel disease and sacroiliitis.  IMPRESSION: Persistent inflammation centered around distal small bowel loops and consistent with ileitis. The fluid collections around the  inflamed small bowel loops have slightly decreased in size. Prominent lymph nodes in the right lower quadrant are likely reactive in etiology.  Enlargement of a presumed left ovarian cyst.  Probable sacroiliitis which can be associated with Crohn's disease and inflammatory bowel disease.   Electronically Signed   By: Richarda OverlieAdam  Henn M.D.   On: 11/15/2013 14:07    Medications:  Scheduled: . heparin   5,000 Units Subcutaneous Q8H  . pantoprazole  40 mg Oral Daily  . piperacillin-tazobactam (ZOSYN)  IV  3.375 g Intravenous Q8H  . potassium chloride  40 mEq Oral Daily  . predniSONE  40 mg Oral Q breakfast   Continuous: . sodium chloride 20 mL/hr at 11/13/13 0746    Assessment/Plan: 1) Probable Crohn's disease. 2) Interloop abscesses.   The patient is well clinically.  She has responded to antibiotics and steroids.  There is still extensive inflammation.  I had a long discussion with the patient and the plan is to perform a colonoscopy.  Before patient assistance can be sought for an anti-TNF agent, biopsy diagnosis is required.  The repeat CT scan does reveal an improvement in her abscesses and I think it is relatively safe to perform the colonoscopy.  Insurance is a major concern and she would like to have the procedure in the hospital also.  Plan: 1) Colonoscopy tomorrow. 2) Continue with steroids and antibiotics.   LOS: 6 days   Gina Gonzalez D 11/16/2013, 8:14 AM

## 2013-11-16 NOTE — Progress Notes (Signed)
FMTS Attending  Note: Roldan Laforest,MD I  have seen and examined this patient, reviewed their chart. I have discussed this patient with the resident. I agree with the resident's findings, assessment and care plan.  Patient doing better today,denies any abdominal pain,she is eager to go home. Plan for colonoscopy with biopsy tomorrow for pathologic diagnosis of crohn's disease. Possible d/c home after procedure tomorrow if clinically stable. She might need oral A/B for few more days, will discuss further treatment plan with GI prior to d/c home.

## 2013-11-16 NOTE — Progress Notes (Signed)
Spoke with pt at request of Family Medicine resident MD re: medication assistance status.  Pt stated that she will be started on Humira and will require pt assistance for that. On the drug website it referred uninsured/unemployed pts to the [employer] patient assistance foundation.  I was able to print an application for the patient and have given it to her.    **There is a part for the MD to complete and sign and it must be sent from the MD office.

## 2013-11-17 ENCOUNTER — Encounter (HOSPITAL_COMMUNITY)
Admission: EM | Disposition: A | Discharge status: Home / Self Care | Payer: Self-pay | Source: Home / Self Care | Attending: Family Medicine

## 2013-11-17 ENCOUNTER — Encounter (HOSPITAL_COMMUNITY): Payer: Self-pay | Admitting: Gastroenterology

## 2013-11-17 HISTORY — PX: COLONOSCOPY: SHX5424

## 2013-11-17 LAB — CBC
HCT: 33 % — ABNORMAL LOW (ref 36.0–46.0)
HEMOGLOBIN: 10.4 g/dL — AB (ref 12.0–15.0)
MCH: 27.7 pg (ref 26.0–34.0)
MCHC: 31.5 g/dL (ref 30.0–36.0)
MCV: 88 fL (ref 78.0–100.0)
PLATELETS: 439 10*3/uL — AB (ref 150–400)
RBC: 3.75 MIL/uL — AB (ref 3.87–5.11)
RDW: 15.4 % (ref 11.5–15.5)
WBC: 9.7 10*3/uL (ref 4.0–10.5)

## 2013-11-17 LAB — BASIC METABOLIC PANEL
BUN: 6 mg/dL (ref 6–23)
CALCIUM: 9.5 mg/dL (ref 8.4–10.5)
CO2: 24 mEq/L (ref 19–32)
Chloride: 99 mEq/L (ref 96–112)
Creatinine, Ser: 0.72 mg/dL (ref 0.50–1.10)
GFR calc Af Amer: >90 mL/min (ref >90)
Glucose, Bld: 75 mg/dL (ref 70–99)
POTASSIUM: 3.6 meq/L — AB (ref 3.7–5.3)
SODIUM: 138 meq/L (ref 137–147)

## 2013-11-17 SURGERY — COLONOSCOPY
Anesthesia: Moderate Sedation

## 2013-11-17 MED ORDER — DIPHENHYDRAMINE HCL 50 MG/ML IJ SOLN
INTRAMUSCULAR | Status: AC
  Filled 2013-11-17: qty 1

## 2013-11-17 MED ORDER — METRONIDAZOLE 500 MG PO TABS: 500.0000 mg | ORAL_TABLET | Freq: Three times a day (TID) | ORAL | Status: DC

## 2013-11-17 MED ORDER — MIDAZOLAM HCL 5 MG/ML IJ SOLN
INTRAMUSCULAR | Status: AC
  Filled 2013-11-17: qty 2

## 2013-11-17 MED ORDER — DIPHENHYDRAMINE HCL 50 MG/ML IJ SOLN
INTRAMUSCULAR | Status: DC | PRN
  Administered 2013-11-17: 25 mg via INTRAVENOUS

## 2013-11-17 MED ORDER — FENTANYL CITRATE 0.05 MG/ML IJ SOLN
INTRAMUSCULAR | Status: AC
  Filled 2013-11-17: qty 4

## 2013-11-17 MED ORDER — MIDAZOLAM HCL 5 MG/5ML IJ SOLN
INTRAMUSCULAR | Status: DC | PRN
  Administered 2013-11-17 (×2): 2 mg via INTRAVENOUS
  Administered 2013-11-17: 1 mg via INTRAVENOUS

## 2013-11-17 MED ORDER — FENTANYL CITRATE 0.05 MG/ML IJ SOLN
INTRAMUSCULAR | Status: DC | PRN
  Administered 2013-11-17 (×4): 25 ug via INTRAVENOUS

## 2013-11-17 NOTE — Progress Notes (Signed)
FMTS Attending  Note: Gina Cashin,MD I  have seen and examined this patient, reviewed their chart. I have discussed this patient with the resident. I agree with the resident's findings, assessment and care plan.  Smrithi feels well post colonoscopy, denies any abdominal pain,still feeling a little drowsy from anesthesia. Colonoscopy result reviewed and discussed with her. Plan to send her home today on oral steroid and antibiotic till f/u with GI. Plan to start Humira after pathology result of her biopsy is available.

## 2013-11-17 NOTE — H&P (View-Only) (Signed)
Family Medicine Teaching Service Daily Progress Note Intern Pager: [phone]  Patient name: Gina Gonzalez Medical record number: [id number] Date of birth: July 13, 1981 Age: 33 y.o. Gender: female  Primary Care Provider: Kevin Fenton, MD Consultants: GI, surgery Code Status: FULL  Pt Overview and Major Events to Date:  12/30: Admitted due to Crohn's Ileitis with abscesses and ?fistula in ileus, GI made aware 12/31: Surgery consulted; No surgery recommended unless patient worsens.  Abx switched to Zosyn. 12/31: GI consulted.  Continue Abx and Prednisone.   IR also consulted - abscesses not amendable to drainage.  11/12/13 - Tolerating clear liquids. Advanced diet to full liquid.  11/13/13 - Patient continuing to improve on Abx.  Assessment and Plan: Gina Gonzalez is a 33 y.o. female presenting with likely Crohn's Disease flare with ileitis seen on CT scan. PMH is significant for Crohn's Disease diagnosed ~1.5 years ago with 1 prior hospitalization Sept 2013 with perforated small bowel.   # Crohn's Ileitis and multiple abscesses of the ileum: - Eating a full diet today. Experienced reflux last night after eating canned peaches.  - Abx: Zosyn (Day 6/6); transition to PO tomorrow.  - Pain control: tylenol  -  protonix  - GI Following: Improvement of her abscesses on CT, will proceed w/ Colonoscopy tomorrow.  Continuing prednisone 40mg  PO daily (Day 6).   # Leukocytosis - Resolved  - Abx as above - Will continue to trend via daily CBC.   # UA with many bacteria - Asymptomatic   # Anemia - Trending up. Stable  -  daily CBC  # Hypokalemia: Improved.  - 3.6>3.7  FEN/GI: KVO IV fluids, full diet but will become NPO with clean out  Prophylaxis: SQ heparin   Disposition: Discharge pending clinical improvement.  Re-consulting case management/SW to aid with lack of insurance and medication affordability.   Subjective:  She feels like she is getting back to normal. She ate some peaches last  night that caused some reflux. This was resolved with tylenol.   Objective: Temp:  [97.6 F (36.4 C)] 97.6 F (36.4 C) (01/05 0629) Pulse Rate:  [56-67] 56 (01/05 0629) Resp:  [18] 18 (01/05 0629) BP: (105-127)/(54-63) 108/57 mmHg (01/05 0629) SpO2:  [98 %-100 %] 100 % (01/05 0272)  Physical Exam: General: Well appearing female in NAD.  Cardiovascular: RRR, no m/r/g Respiratory: CTAB, no rales, rhonchi, or wheezing.  Abdomen: Soft, nondistended.  nontender. +BS.  Extremities: No edema noted.  Laboratory/Imaging:  Recent Labs Lab 11/14/13 0520 11/15/13 0544 11/16/13 0537  WBC 10.7* 10.0 8.6  HGB 9.3* 9.6* 10.6*  HCT 30.4* 31.4* 34.3*  PLT 421* 425* 434*    Recent Labs Lab 11/10/13 1710  11/14/13 0520 11/15/13 0544 11/16/13 0537  NA 135*  < > 142 140 142  K 3.5*  < > 3.2* 3.6* 3.7  CL 92*  < > 103 103 101  CO2 28  < > 26 25 27   BUN 10  < > 4* 4* 5*  CREATININE 0.72  < > 0.72 0.79 0.87  CALCIUM 9.0  < > 9.0 9.1 9.3  PROT 7.8  --   --   --   --   BILITOT 0.6  --   --   --   --   ALKPHOS 89  --   --   --   --   ALT 14  --   --   --   --   AST 8  --   --   --   --  GLUCOSE 92  < > 80 79 82  < > = values in this interval not displayed.  Ct Abdomen Pelvis W Contrast  11/10/2013   IMPRESSION: 1. Findings again consistent with provided history of Crohn's disease, with extensive distal small bowel inflammatory change. Most of the ileum is inflamed with matted loops of bowel and multiple interloop abscesses. There is adenopathy and there is concern for developing fistula. 2. Findings consistent with left ovarian ruptured hemorrhagic cyst 3. Sacroiliitis associated with inflammatory bowel disease.    Danny Yackley E Shoua Ressler, MD 11/16/2013, 9:12 AM PGY-1,  Family Medicine FPTS Intern pager: [phone], text pages welcome  

## 2013-11-17 NOTE — Op Note (Signed)
[facility] [address] [place], [id number]   OPERATIVE PROCEDURE REPORT  PATIENT: Gina LevanSmith, Gina Gonzalez  MR#: [id number] BIRTHDATE: [birth year]  GENDER: Female ENDOSCOPIST: Jeani HawkingPatrick Pancho Rushing, MD ASSISTANT:   Beryle BeamsJanie Billups, technician Dwain SarnaPatricia Ford, RN CGRN PROCEDURE DATE: 11/17/2013 PROCEDURE:   Colonoscopy with cold biopsies ASA CLASS:   Class III INDICATIONS:Probable Crohn's disease MEDICATIONS: Versed 5 mg IV, Fentanyl 100 mcg IV, and Benadryl 25 mg IV  DESCRIPTION OF PROCEDURE:   After the risks benefits and alternatives of the procedure were thoroughly explained, informed consent was obtained.  A digital rectal exam revealed no abnormalities of the rectum.    The EC-[id number] ([id number]  endoscope was introduced through the anus  and advanced to the terminal ileum which was intubated for a short distance , No adverse events experienced.    The quality of the prep was excellent. .  The instrument was then slowly withdrawn as the colon was fully examined.     FINDINGS: The distal terminal ileum was intubated.  There was mild evidence of inflammation.  Deeper intubation was not possible. Multiple cold biopsies were obtained.  No evidence of any inflammation, ulcerations, erosions, polyps, masses, or vascular abnormalities in the colon.   Retroflexed views revealed internal/external hemorrhoids.     The scope was then withdrawn from the patient and the procedure terminated.  COMPLICATIONS: There were no complications.  IMPRESSION: 1)Ileitis 2) Int/Ext Hemorrhoids  RECOMMENDATIONS: 1) Await biopsy results. 2) Continue with Prednisone. 3) Follow up in 1 week in the office.  _______________________________ eSigned:  Jeani HawkingPatrick Loranda Mastel, MD 11/17/2013 7:59 AM

## 2013-11-17 NOTE — Progress Notes (Signed)
Family Medicine Teaching Service Daily Progress Note Intern Pager: [phone]  Patient name: Gina Gonzalez Medical record number: [id number] Date of birth: May 19, 1981 Age: 33 y.o. Gender: female  Primary Care Provider: Kevin Fenton, MD Consultants: GI, surgery Code Status: FULL  Pt Overview and Major Events to Date:  12/30: Admitted due to Crohn's Ileitis with abscesses and ?fistula in ileus, GI made aware 12/31: Surgery consulted; No surgery recommended unless patient worsens.  Abx switched to Zosyn. 12/31: GI consulted.  Continue Abx and Prednisone.   IR also consulted - abscesses not amendable to drainage.  11/12/13 - Tolerating clear liquids. Advanced diet to full liquid.  11/13/13 - Patient continuing to improve on Abx. 11/16/13 - colonoscopy performed.   Assessment and Plan: Bird Bonne is a 33 y.o. female presenting with likely Crohn's Disease flare with ileitis seen on CT scan. PMH is significant for Crohn's Disease diagnosed ~1.5 years ago with 1 prior hospitalization Sept 2013 with perforated small bowel.   # Crohn's Ileitis and multiple abscesses of the ileum: - Was feeling completely normal to colonoscopy.  - Abx: Zosyn (Day 7; transition to PO today?   - Pain control: tylenol  -  protonix  - GI Following: Colonoscopy today, awaiting bx results. Follow up in one week. Continuing prednisone 40mg  PO daily (Day 7).    # Leukocytosis - Resolved  - Abx as above - Will continue to trend via daily CBC.   # UA with many bacteria - Asymptomatic   # Anemia - Stable  -  daily CBC  # Hypokalemia: Improved. Attributed to her lack of diet.  - 3.6>3.7>3.6   FEN/GI: KVO IV fluids, full diet but will become NPO with clean out  Prophylaxis: SQ heparin   Disposition: Discharge pending clinical improvement.  Re-consulting case management/SW to aid with lack of insurance and medication affordability.   Subjective:  She was feeling completely normal prior to her colonoscopy today.  She was "foggy" from the sedation. She was walking and talking appropriately.   Objective: Temp:  [97.7 F (36.5 C)-98.3 F (36.8 C)] 97.8 F (36.6 C) (01/06 0718) Pulse Rate:  [57-77] 77 (01/06 0718) Resp:  [17-18] 18 (01/06 0718) BP: (111-174)/(45-73) 174/73 mmHg (01/06 0718) SpO2:  [95 %-99 %] 98 % (01/06 0718) Weight:  [241 lb (109.317 kg)] 241 lb (109.317 kg) (01/06 1245)  Physical Exam: General: Well appearing female in NAD.  Cardiovascular: RRR, no m/r/g Respiratory: CTAB, no rales, rhonchi, or wheezing.  Abdomen: Soft, nondistended.  nontender. +BS.  Extremities: No edema noted.  Laboratory/Imaging:  Recent Labs Lab 11/15/13 0544 11/16/13 0537 11/17/13 0535  WBC 10.0 8.6 9.7  HGB 9.6* 10.6* 10.4*  HCT 31.4* 34.3* 33.0*  PLT 425* 434* 439*    Recent Labs Lab 11/10/13 1710  11/15/13 0544 11/16/13 0537 11/17/13 0535  NA 135*  < > 140 142 138  K 3.5*  < > 3.6* 3.7 3.6*  CL 92*  < > 103 101 99  CO2 28  < > 25 27 24   BUN 10  < > 4* 5* 6  CREATININE 0.72  < > 0.79 0.87 0.72  CALCIUM 9.0  < > 9.1 9.3 9.5  PROT 7.8  --   --   --   --   BILITOT 0.6  --   --   --   --   ALKPHOS 89  --   --   --   --   ALT 14  --   --   --   --  AST 8  --   --   --   --   GLUCOSE 92  < > 79 82 75  < > = values in this interval not displayed.   Myra RudeJeremy E Abdulaziz Toman, MD 11/17/2013, 7:20 AM PGY-1, [facility] FPTS Intern pager: [phone], text pages welcome

## 2013-11-17 NOTE — Discharge Instructions (Signed)
Gina Gonzalez was admitted for a flare of your Crohn's disease. A colonoscopy was performed and Dr. Elnoria Howard will provide you will the results. You should follow up with him in a week. If you don't hear from his office then give them a call.  Please take the Humira application form with you to Dr. Haywood Pao office.   We are sending you home on a 10 day course of Flagyl for the abscesses. Please pick up the prescription for prednisone and take as directed.    Please follow up with Dr. Ermalinda Memos in clinic as scheduled.  Crohn's Disease Crohn's disease is a long-term (chronic) soreness and redness (inflammation) of the intestines (bowel). It can affect any portion of the digestive tract, from the mouth to the anus. It can also cause problems outside the digestive tract. Crohn's disease is closely related to a disease called ulcerative colitis (together, these two diseases are called inflammatory bowel disease).  CAUSES  The cause of Crohn's disease is not known. One Nelva Bush is that, in an easily affected person, the immune system is triggered to attack the body's own digestive tissue. Crohn's disease runs in families. It seems to be more common in certain geographic areas and amongst certain races. There are no clear-cut dietary causes.  SYMPTOMS  Crohn's disease can cause many different symptoms since it can affect many different parts of the body. Symptoms include:  Fatigue.  Weight loss.  Chronic diarrhea, sometime bloody.  Abdominal pain and cramps.  Fever.  Ulcers or canker sores in the mouth or rectum.  Anemia (low red blood cells).  Arthritis, skin problems, and eye problems may occur. Complications of Crohn's disease can include:  Series of holes (perforation) of the bowel.  Portions of the intestines sticking to each other (adhesions).  Obstruction of the bowel.  Fistula formation, typically in the rectal area but also in other areas. A fistula is an opening between the bowels and the  outside, or between the bowels and another organ.  A painful crack in the mucous membrane of the anus (rectal fissure). DIAGNOSIS  Your caregiver may suspect Crohn's disease based on your symptoms and an exam. Blood tests may confirm that there is a problem. You may be asked to submit a stool specimen for examination. X-rays and CT scans may be necessary. Ultimately, the diagnosis is usually made after a procedure that uses a flexible tube that is inserted via your mouth or your anus. This is done under sedation and is called either an upper endoscopy or colonoscopy. With these tests, the specialist can take tiny tissue samples and remove them from the inside of the bowel (biopsy). Examination of this biopsy tissue under a microscope can reveal Crohn's disease as the cause of your symptoms. Due to the many different forms that Crohn's disease can take, symptoms may be present for several years before a diagnosis is made. TREATMENT  Medications are often used to decrease inflammation and control the immune system. These include medicines related to aspirin, steroid medications, and newer and stronger medications to slow down the immune system. Some medications may be used as suppositories or enemas. A number of other medications are used or have been studied. Your caregiver will make specific recommendations. HOME CARE INSTRUCTIONS   Symptoms such as diarrhea can be controlled with medications. Avoid foods that have a laxative effect such as fresh fruit, vegetables and dairy products. During flare ups, you can rest your bowel by refraining from solid foods. Drink clear liquids frequently  during the day (electrolyte or re-hydrating fluids are best. Your caregiver can help you with suggestions). Drink often to prevent loss of body fluids (dehydration). When diarrhea has cleared, eat small meals and more frequently. Avoid food additives and stimulants such as caffeine (coffee, tea, or chocolate). Enzyme  supplements may help if you develop intolerance to a sugar in dairy products (lactose). Ask your caregiver or dietitian about specific dietary instructions.  Try to maintain a positive attitude. Learn relaxation techniques such as self hypnosis, mental imaging, and muscle relaxation.  If possible, avoid stresses which can aggravate your condition.  Exercise regularly.  Follow your diet.  Always get plenty of rest. SEEK MEDICAL CARE IF:   Your symptoms fail to improve after a week or two of new treatment.  You experience continued weight loss.  You have ongoing cramps or loose bowels.  You develop a new skin rash, skin sores, or eye problems. SEEK IMMEDIATE MEDICAL CARE IF:   You have worsening of your symptoms or develop new symptoms.  You have a fever.  You develop bloody diarrhea.  You develop severe abdominal pain. MAKE SURE YOU:   Understand these instructions.  Will watch your condition.  Will get help right away if you are not doing well or get worse. Document Released: 08/08/2005 Document Revised: 02/23/2013 Document Reviewed: 07/07/2007 [facility] Patient Information 2014 [place], [place].

## 2013-11-17 NOTE — H&P (View-Only) (Signed)
FMTS Attending  Note: Sherryann Frese,MD I  have seen and examined this patient, reviewed their chart. I have discussed this patient with the resident. I agree with the resident's findings, assessment and care plan.  Patient doing better today,denies any abdominal pain,she is eager to go home. Plan for colonoscopy with biopsy tomorrow for pathologic diagnosis of crohn's disease. Possible d/c home after procedure tomorrow if clinically stable. She might need oral A/B for few more days, will discuss further treatment plan with GI prior to d/c home. 

## 2013-11-17 NOTE — Interval H&P Note (Signed)
History and Physical Interval Note:  11/17/2013 7:21 AM  Gina Gonzalez  has presented today for surgery, with the diagnosis of Probable Crohn's disease  The various methods of treatment have been discussed with the patient and family. After consideration of risks, benefits and other options for treatment, the patient has consented to  Procedure(s): COLONOSCOPY (N/A) as a surgical intervention .  The patient's history has been reviewed, patient examined, no change in status, stable for surgery.  I have reviewed the patient's chart and labs.  Questions were answered to the patient's satisfaction.     [name] D

## 2013-11-18 ENCOUNTER — Encounter (HOSPITAL_COMMUNITY): Payer: Self-pay | Admitting: Gastroenterology

## 2013-11-19 NOTE — Discharge Summary (Signed)
FMTS Attending  Note: Gina Iezzi,MD I  have seen and examined this patient, reviewed their chart. I have discussed this patient with the resident. I agree with the resident's findings, assessment and care plan.  

## 2013-11-20 ENCOUNTER — Inpatient Hospital Stay: Payer: Self-pay | Admitting: Family Medicine

## 2013-11-25 ENCOUNTER — Ambulatory Visit (INDEPENDENT_AMBULATORY_CARE_PROVIDER_SITE_OTHER): Payer: Self-pay | Admitting: Family Medicine

## 2013-11-25 ENCOUNTER — Encounter: Payer: Self-pay | Admitting: Family Medicine

## 2013-11-25 VITALS — BP 132/73 | HR 84 | Temp 97.6°F | Resp 18 | Wt 242.0 lb

## 2013-11-25 DIAGNOSIS — K5010 Crohn's disease of large intestine without complications: Secondary | ICD-10-CM

## 2013-11-25 NOTE — Progress Notes (Signed)
Patient ID: Gina Gonzalez, female   DOB: Nov 05, 1981, 33 y.o.   MRN: [id number]  Kevin Fenton, MD Phone: [phone]  Subjective:  Chief complaint-noted  # [place] follow up for crohn's flare and multiple interloop abscesses  She was admitted and treated with IV antibiotics and high-dose steroids. She's now on her last days of by mouth Flagyl and tapering her by mouth prednisone. She's currently on 35 mg prednisone daily.  She reports that her abdominal pain has completely resolved and she feels "normal" again. She's tolerating food easily. She denies any fevers, chills, sweats. 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  She states she has plans to see GI in about 2 weeks, they would like to start her on Humira at that time. She had a colonoscopy with a biopsy consistent with Crohn's disease while admitted.  ROS- Per history of present illness  Past Medical History Patient Active Problem List   Diagnosis Date Noted  . Crohn's colitis 11/10/2013  . Leukocytosis, unspecified 11/10/2013  . Intestinal abscess 11/10/2013  . Crohn's disease of ileum 10/23/2013  . Abdominal pain, other specified site 10/23/2013  . Crohn's disease 08/15/2012  . Anemia 08/15/2012  . Abdominal pain, acute, bilateral lower quadrant 07/30/2012  . Fever 07/30/2012  . Perforated small intestine 07/29/2012    Medications- reviewed and updated Current Outpatient Prescriptions  Medication Sig Dispense Refill  . Alpha-D-Galactosidase (BEANO PO) Take 1 tablet by mouth 2 (two) times daily as needed (gas).      Marland Kitchen HYDROcodone-acetaminophen (NORCO) 10-325 MG per tablet Take 0.5-1 tablets by mouth 4 (four) times daily as needed (pain).      Marland Kitchen ibuprofen (ADVIL,MOTRIN) 200 MG tablet Take 800-1,000 mg by mouth 2 (two) times daily as needed (daily). For pain      . metroNIDAZOLE (FLAGYL) 500 MG tablet Take 1 tablet (500 mg total) by mouth 3 (three) times daily.  30 tablet  0  . predniSONE  (DELTASONE) 20 MG tablet Take 20-40 mg by mouth daily with breakfast. As directed by md      . promethazine (PHENERGAN) 12.5 MG tablet Take 1 tablet (12.5 mg total) by mouth every 8 (eight) hours as needed for nausea or vomiting.  30 tablet  0   No current facility-administered medications for this visit.    Objective: BP 132/73  Pulse 84  Temp(Src) 97.6 F (36.4 C) (Oral)  Resp 18  Wt 242 lb (109.77 kg)  SpO2 98%  LMP 11/14/2013 Gen: NAD, alert, cooperative with exam HEENT: NCAT, MMM CV: RRR, good S1/S2, no murmur Resp: CTABL, no wheezes, non-labored Abd: SNTND, BS present, no guarding or organomegaly Ext: No edema, warm Neuro: Alert and oriented, No gross deficits   Assessment/Plan:  Crohn's colitis Resolving Advised followup with GI as planned. Continue by mouth Flagyl until gone (approximately 3 additional days) Taper prednisone as tolerated, encouraged decreasing by 5 mg every 3 days and continuing on 10 mg daily until followup Followup one month  Written note for her to return to work, also filled out FMLA paperwork for her mother who was there for support in the hospital.

## 2013-11-25 NOTE — Assessment & Plan Note (Addendum)
Resolving Advised followup with GI as planned. Continue by mouth Flagyl until gone (approximately 3 additional days) Taper prednisone as tolerated, encouraged decreasing by 5 mg every 3 days and continuing on 10 mg daily until followup Followup one month  Written note for her to return to work, also filled out FMLA paperwork for her mother who was there for support in the hospital.

## 2013-11-25 NOTE — Patient Instructions (Signed)
Great to see you! PLease be sure to see Dr. Elnoria Howard  Come back in 1 month  Taper steroids by 5 mg every 3 days until you get to 10 mg

## 2014-02-17 ENCOUNTER — Encounter: Payer: Self-pay | Admitting: Family Medicine

## 2014-02-17 ENCOUNTER — Ambulatory Visit (INDEPENDENT_AMBULATORY_CARE_PROVIDER_SITE_OTHER): Payer: Self-pay | Admitting: Family Medicine

## 2014-02-17 VITALS — BP 123/50 | HR 89 | Temp 99.3°F | Ht 67.0 in | Wt 254.0 lb

## 2014-02-17 DIAGNOSIS — Z23 Encounter for immunization: Secondary | ICD-10-CM

## 2014-02-17 DIAGNOSIS — K5000 Crohn's disease of small intestine without complications: Secondary | ICD-10-CM

## 2014-02-17 DIAGNOSIS — Z0000 Encounter for general adult medical examination without abnormal findings: Secondary | ICD-10-CM

## 2014-02-17 MED ORDER — PREDNISONE 5 MG PO TABS: 5.0000 mg | ORAL_TABLET | Freq: Every day | ORAL | Status: DC

## 2014-02-17 NOTE — Assessment & Plan Note (Signed)
Doing well, tapered prednisone to 10 mg Encourage GI f/u, humira would be much preferable to chronic prednisone Decreased prednisone to 5 mg, f/u 3-4 weeks and likely decrease further

## 2014-02-17 NOTE — Assessment & Plan Note (Signed)
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

## 2014-02-17 NOTE — Progress Notes (Signed)
Patient ID: Gina Gonzalez, female   DOB: 08/14/1981, 33 y.o.   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  Feeling well overall with no current symptoms of illness.  Crohn's disease Now on 10 mg of prednisone daily, states that she is nearly pain free. She is tolerating by mouth well he denies any bloody stools. She states that her pain began to improve very well shortly after our last visit. She has not followed up with GI and due to financial reasons.  She understands the risks and benefits of using prednisone chronically.  ROS- No fever, chills, sweats No dyspnea No chest pain Mild infrequent abdominal pain   Past Medical History Patient Active Problem List   Diagnosis Date Noted  . Annual physical exam 02/17/2014  . Crohn's colitis 11/10/2013  . Leukocytosis, unspecified 11/10/2013  . Intestinal abscess 11/10/2013  . Crohn's disease of ileum 10/23/2013  . Abdominal pain, other specified site 10/23/2013  . Crohn's disease 08/15/2012  . Anemia 08/15/2012  . Abdominal pain, acute, bilateral lower quadrant 07/30/2012  . Fever 07/30/2012  . Perforated small intestine 07/29/2012    Medications- reviewed and updated Current Outpatient Prescriptions  Medication Sig Dispense Refill  . Alpha-D-Galactosidase (BEANO PO) Take 1 tablet by mouth 2 (two) times daily as needed (gas).      Marland Kitchen HYDROcodone-acetaminophen (NORCO) 10-325 MG per tablet Take 0.5-1 tablets by mouth 4 (four) times daily as needed (pain).      Marland Kitchen ibuprofen (ADVIL,MOTRIN) 200 MG tablet Take 800-1,000 mg by mouth 2 (two) times daily as needed (daily). For pain      . metroNIDAZOLE (FLAGYL) 500 MG tablet Take 1 tablet (500 mg total) by mouth 3 (three) times daily.  30 tablet  0  .  predniSONE (DELTASONE) 5 MG tablet Take 1 tablet (5 mg total) by mouth daily with breakfast.  45 tablet  0  . promethazine (PHENERGAN) 12.5 MG tablet Take 1 tablet (12.5 mg total) by mouth every 8 (eight) hours as needed for nausea or vomiting.  30 tablet  0   No current facility-administered medications for this visit.    Objective: BP 123/50  Pulse 89  Temp(Src) 99.3 F (37.4 C) (Oral)  Ht 5\' 7"  (1.702 m)  Wt 254 lb (115.214 kg)  BMI 39.77 kg/m2  LMP 02/17/2014 Gen: NAD, alert, cooperative with exam HEENT: NCAT, EOMI, PERRL CV: RRR, good S1/S2, no murmur Resp: CTABL, no wheezes, non-labored Abd: Soft, mild RLQ tenderness to palpation, no guarding, positive BS Ext: No edema, warm Neuro: Alert and oriented, No gross deficits   Assessment/Plan:  Crohn's disease of ileum Doing well, tapered prednisone to 10 mg Encourage GI f/u, humira would be much preferable to chronic prednisone Decreased prednisone to 5 mg, f/u 3-4 weeks and likely decrease further  Annual physical exam Exam largely normal except mild abd tenderness Needs pap, will make appt 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 predniSONE (DELTASONE) 5 MG tablet    Sig: Take 1 tablet (5 mg total) by mouth daily with breakfast.    Dispense:  45 tablet    Refill:  0

## 2014-02-17 NOTE — Patient Instructions (Signed)
Great to see you again  Cut back to 5 mg prednisone daily, I have writtten an Rx  Come back as soon as you can for a pelvic and to clear up your immunizations

## 2014-03-04 ENCOUNTER — Telehealth: Payer: Self-pay | Admitting: Family Medicine

## 2014-03-04 DIAGNOSIS — Z299 Encounter for prophylactic measures, unspecified: Secondary | ICD-10-CM

## 2014-03-04 NOTE — Telephone Encounter (Signed)
Order entered. Alvaretta Eisenberger J Bell Cai, MD  

## 2014-03-04 NOTE — Telephone Encounter (Signed)
Patient is needing order for varicella titer for lab. Need this for school.  Please put in before her appt for her tb skin test on 4/29

## 2014-03-10 ENCOUNTER — Ambulatory Visit (INDEPENDENT_AMBULATORY_CARE_PROVIDER_SITE_OTHER): Payer: Self-pay | Admitting: *Deleted

## 2014-03-10 ENCOUNTER — Other Ambulatory Visit: Payer: Self-pay

## 2014-03-10 DIAGNOSIS — Z111 Encounter for screening for respiratory tuberculosis: Secondary | ICD-10-CM

## 2014-03-10 DIAGNOSIS — Z299 Encounter for prophylactic measures, unspecified: Secondary | ICD-10-CM

## 2014-03-10 NOTE — Progress Notes (Signed)
VARICELLA DONE TODAY Calyn Sivils 

## 2014-03-11 ENCOUNTER — Encounter: Payer: Self-pay | Admitting: Family Medicine

## 2014-03-11 LAB — VARICELLA ZOSTER ANTIBODY, IGG: VARICELLA IGG: 1125 {index} — AB (ref <135.00)

## 2014-03-12 ENCOUNTER — Ambulatory Visit (INDEPENDENT_AMBULATORY_CARE_PROVIDER_SITE_OTHER): Payer: Self-pay | Admitting: *Deleted

## 2014-03-12 ENCOUNTER — Encounter: Payer: Self-pay | Admitting: *Deleted

## 2014-03-12 ENCOUNTER — Encounter: Payer: Self-pay | Admitting: Family Medicine

## 2014-03-12 ENCOUNTER — Ambulatory Visit (INDEPENDENT_AMBULATORY_CARE_PROVIDER_SITE_OTHER): Payer: Medicaid Other | Admitting: Family Medicine

## 2014-03-12 VITALS — BP 127/81 | HR 97 | Temp 98.2°F | Ht 67.0 in | Wt 250.0 lb

## 2014-03-12 DIAGNOSIS — Z309 Encounter for contraceptive management, unspecified: Secondary | ICD-10-CM

## 2014-03-12 DIAGNOSIS — Z3009 Encounter for other general counseling and advice on contraception: Secondary | ICD-10-CM

## 2014-03-12 DIAGNOSIS — Z111 Encounter for screening for respiratory tuberculosis: Secondary | ICD-10-CM

## 2014-03-12 LAB — TB SKIN TEST
Induration: 0 mm
TB Skin Test: NEGATIVE

## 2014-03-12 LAB — POCT URINE PREGNANCY: PREG TEST UR: NEGATIVE

## 2014-03-12 MED ORDER — MEDROXYPROGESTERONE ACETATE 150 MG/ML IM SUSP
150.0000 mg | Freq: Once | INTRAMUSCULAR | Status: AC
  Administered 2014-03-12: 150 mg via INTRAMUSCULAR

## 2014-03-12 NOTE — Progress Notes (Signed)
   Subjective:    Patient ID: Gina Gonzalez, female    DOB: Sep 25, 1981, 33 y.o.   MRN: [id number]  HPI  33 year old here for contraception counseling. Pt interested in injection. No hx of pregnancy. Not sexually active, but plans to be and does not want to become pregnant while in school. Pt is studying to become a Engineer, site at [employer].  No hx of DVT, PE, cancer Social - Pt does not smoke   PMH - Crohn's Disease   Current Outpatient Prescriptions on File Prior to Visit  Medication Sig Dispense Refill  . Alpha-D-Galactosidase (BEANO PO) Take 1 tablet by mouth 2 (two) times daily as needed (gas).      Marland Kitchen HYDROcodone-acetaminophen (NORCO) 10-325 MG per tablet Take 0.5-1 tablets by mouth 4 (four) times daily as needed (pain).      Marland Kitchen ibuprofen (ADVIL,MOTRIN) 200 MG tablet Take 800-1,000 mg by mouth 2 (two) times daily as needed (daily). For pain      . predniSONE (DELTASONE) 5 MG tablet Take 1 tablet (5 mg total) by mouth daily with breakfast.  45 tablet  0  . promethazine (PHENERGAN) 12.5 MG tablet Take 1 tablet (12.5 mg total) by mouth every 8 (eight) hours as needed for nausea or vomiting.  30 tablet  0  . metroNIDAZOLE (FLAGYL) 500 MG tablet Take 1 tablet (500 mg total) by mouth 3 (three) times daily.  30 tablet  0   No current facility-administered medications on file prior to visit.     Review of Systems Negative     Objective:   Physical Exam BP 127/81  Pulse 97  Temp(Src) 98.2 F (36.8 C) (Oral)  Ht 5\' 7"  (1.702 m)  Wt 250 lb (113.399 kg)  BMI 39.15 kg/m2  LMP 02/14/2014  Gen: obese white female  Pulm: normal work of breathing, non distressed     Assessment & Plan:

## 2014-03-12 NOTE — Patient Instructions (Signed)
Ms. Stephanye, [place] to meet you. As I mentioned, you may experience irregular bleeding with the Depo shot. You should come back in 3 months for another injection. You can have a visit with the nurse for this.   Good luck with school!  Sincerely,   Dr. Clinton Sawyer

## 2014-03-17 ENCOUNTER — Other Ambulatory Visit: Payer: Medicaid Other

## 2014-03-17 ENCOUNTER — Ambulatory Visit: Payer: Medicaid Other

## 2014-04-09 ENCOUNTER — Ambulatory Visit (INDEPENDENT_AMBULATORY_CARE_PROVIDER_SITE_OTHER): Payer: Self-pay | Admitting: Family Medicine

## 2014-04-09 ENCOUNTER — Encounter: Payer: Self-pay | Admitting: Family Medicine

## 2014-04-09 VITALS — BP 115/82 | HR 85 | Temp 98.8°F | Ht 67.0 in | Wt 248.5 lb

## 2014-04-09 DIAGNOSIS — K5000 Crohn's disease of small intestine without complications: Secondary | ICD-10-CM

## 2014-04-09 MED ORDER — PREDNISONE 5 MG PO TABS: ORAL_TABLET | ORAL | Status: DC

## 2014-04-09 MED ORDER — METRONIDAZOLE 500 MG PO TABS: 500.0000 mg | ORAL_TABLET | Freq: Two times a day (BID) | ORAL | Status: DC

## 2014-04-09 MED ORDER — HYDROCODONE-ACETAMINOPHEN 10-325 MG PO TABS: 0.5000 | ORAL_TABLET | Freq: Four times a day (QID) | ORAL | Status: DC | PRN

## 2014-04-09 MED ORDER — CIPROFLOXACIN HCL 500 MG PO TABS: 500.0000 mg | ORAL_TABLET | Freq: Two times a day (BID) | ORAL | Status: DC

## 2014-04-09 NOTE — Patient Instructions (Signed)
Take antibiotics twice daily for 2 weeks. Take 10mg  of prednisone for 2 weeks, then decrease to 5mg  until complete  Follow up 7-10 days, or sooner if you do not get better.  Mia Milan M. Rashay Barnette, M.D.

## 2014-04-09 NOTE — Progress Notes (Signed)
Patient ID: Gina Gonzalez, female   DOB: 1981-09-02, 33 y.o.   MRN: [id number]    Subjective: HPI: Patient is a 33 y.o. female presenting to clinic today for follow up for Crohn's disease, currently in a flare.  Last week she had abdominal cramps, but was on her period. Thought it was related to her cycle but she continued to have pain after her cycle ended. She is currently on Prednisone 5 mg and Hydrocodone. She is supposed to be ending the prednisone in a few days from a prolonged taper. She has been well controlled since January. Only trigger is stress, no different foods or medications. Denies bloody diarrhea. No fevers, but reports chills.   History Reviewed: Never smoker.  ROS: Please see HPI above.  Objective: Office vital signs reviewed. BP 115/82  Pulse 85  Temp(Src) 98.8 F (37.1 C) (Oral)  Ht 5\' 7"  (1.702 m)  Wt 248 lb 8 oz (112.719 kg)  BMI 38.91 kg/m2  LMP 03/29/2014  Physical Examination:  General: Awake, alert. NAD HEENT: Atraumatic, normocephalic Abd: Soft, mild diffuse tenderness  Assessment: 33 y.o. female with Crohn's flare  Plan: See Problem List and After Visit Summary

## 2014-04-09 NOTE — Assessment & Plan Note (Signed)
A: Patient with mild Crohn's flare. Most likely does not have abscess since her pain is not too severe and she is not having fever. However, the fact that she has done so well on Prednisone 5mg  and now having increasing pain is concerning for a developing flare or infection.  P: - Cipro and flagyl x2 weeks. (Due to no Rx drug coverage, Cipro printed out for patient to take to [employer]. Flagyl sent to [facility].) - Increase Prednisone to 10mg  x2 weeks. Discussed with patient that I do not want her to go on high doses of steroids again if that can be avoided - Norco prn - F/u in 7-10 days or sooner if not improving - Given red flags that should be treated immediately including fever, worsening pain, bloody diarrhea

## 2014-04-26 ENCOUNTER — Encounter (HOSPITAL_COMMUNITY): Payer: Self-pay | Admitting: Emergency Medicine

## 2014-04-26 DIAGNOSIS — E46 Unspecified protein-calorie malnutrition: Secondary | ICD-10-CM | POA: Diagnosis present

## 2014-04-26 DIAGNOSIS — E669 Obesity, unspecified: Secondary | ICD-10-CM | POA: Diagnosis present

## 2014-04-26 DIAGNOSIS — D649 Anemia, unspecified: Secondary | ICD-10-CM | POA: Diagnosis present

## 2014-04-26 DIAGNOSIS — K630 Abscess of intestine: Secondary | ICD-10-CM | POA: Diagnosis present

## 2014-04-26 DIAGNOSIS — Z6837 Body mass index (BMI) 37.0-37.9, adult: Secondary | ICD-10-CM

## 2014-04-26 DIAGNOSIS — K5010 Crohn's disease of large intestine without complications: Principal | ICD-10-CM | POA: Diagnosis present

## 2014-04-26 DIAGNOSIS — IMO0002 Reserved for concepts with insufficient information to code with codable children: Secondary | ICD-10-CM

## 2014-04-26 NOTE — ED Notes (Signed)
Pt. reports Crohn's flare up with generalized abdominal pain , emesis and occasional diarrhea. Denies fever or chills.

## 2014-04-27 ENCOUNTER — Emergency Department (HOSPITAL_COMMUNITY): Payer: Self-pay

## 2014-04-27 ENCOUNTER — Inpatient Hospital Stay (HOSPITAL_COMMUNITY)
Admission: EM | Admit: 2014-04-27 | Discharge: 2014-04-30 | DRG: 386 | Disposition: A | Discharge status: Home / Self Care | Payer: Self-pay | Attending: Family Medicine | Admitting: Family Medicine

## 2014-04-27 ENCOUNTER — Encounter (HOSPITAL_COMMUNITY): Payer: Self-pay

## 2014-04-27 DIAGNOSIS — K50914 Crohn's disease, unspecified, with abscess: Secondary | ICD-10-CM

## 2014-04-27 DIAGNOSIS — R109 Unspecified abdominal pain: Secondary | ICD-10-CM

## 2014-04-27 DIAGNOSIS — K5090 Crohn's disease, unspecified, without complications: Secondary | ICD-10-CM

## 2014-04-27 DIAGNOSIS — K5010 Crohn's disease of large intestine without complications: Secondary | ICD-10-CM

## 2014-04-27 DIAGNOSIS — R933 Abnormal findings on diagnostic imaging of other parts of digestive tract: Secondary | ICD-10-CM

## 2014-04-27 DIAGNOSIS — D649 Anemia, unspecified: Secondary | ICD-10-CM

## 2014-04-27 LAB — CBC WITH DIFFERENTIAL/PLATELET
BASOS ABS: 0 10*3/uL (ref 0.0–0.1)
BASOS PCT: 0 % (ref 0–1)
Eosinophils Absolute: 0 10*3/uL (ref 0.0–0.7)
Eosinophils Relative: 0 % (ref 0–5)
HCT: 32.2 % — ABNORMAL LOW (ref 36.0–46.0)
HEMOGLOBIN: 10.5 g/dL — AB (ref 12.0–15.0)
Lymphocytes Relative: 11 % — ABNORMAL LOW (ref 12–46)
Lymphs Abs: 1.8 10*3/uL (ref 0.7–4.0)
MCH: 27.7 pg (ref 26.0–34.0)
MCHC: 32.6 g/dL (ref 30.0–36.0)
MCV: 85 fL (ref 78.0–100.0)
Monocytes Absolute: 1.1 10*3/uL — ABNORMAL HIGH (ref 0.1–1.0)
Monocytes Relative: 7 % (ref 3–12)
NEUTROS ABS: 13.4 10*3/uL — AB (ref 1.7–7.7)
Neutrophils Relative %: 82 % — ABNORMAL HIGH (ref 43–77)
Platelets: 572 10*3/uL — ABNORMAL HIGH (ref 150–400)
RBC: 3.79 MIL/uL — ABNORMAL LOW (ref 3.87–5.11)
RDW: 13.5 % (ref 11.5–15.5)
WBC: 16.3 10*3/uL — ABNORMAL HIGH (ref 4.0–10.5)

## 2014-04-27 LAB — CREATININE, SERUM
Creatinine, Ser: 0.7 mg/dL (ref 0.50–1.10)
GFR calc Af Amer: >90 mL/min (ref >90)

## 2014-04-27 LAB — URINALYSIS, ROUTINE W REFLEX MICROSCOPIC
BILIRUBIN URINE: NEGATIVE
Glucose, UA: NEGATIVE mg/dL
Hgb urine dipstick: NEGATIVE
Ketones, ur: NEGATIVE mg/dL
NITRITE: NEGATIVE
PH: 8.5 — AB (ref 5.0–8.0)
Protein, ur: 30 mg/dL — AB
Specific Gravity, Urine: 1.021 (ref 1.005–1.030)
UROBILINOGEN UA: 0.2 mg/dL (ref 0.0–1.0)

## 2014-04-27 LAB — CBC
HEMATOCRIT: 31.6 % — AB (ref 36.0–46.0)
HEMOGLOBIN: 10 g/dL — AB (ref 12.0–15.0)
MCH: 27.2 pg (ref 26.0–34.0)
MCHC: 31.6 g/dL (ref 30.0–36.0)
MCV: 86.1 fL (ref 78.0–100.0)
Platelets: 463 10*3/uL — ABNORMAL HIGH (ref 150–400)
RBC: 3.67 MIL/uL — ABNORMAL LOW (ref 3.87–5.11)
RDW: 13.5 % (ref 11.5–15.5)
WBC: 14.6 10*3/uL — ABNORMAL HIGH (ref 4.0–10.5)

## 2014-04-27 LAB — URINE MICROSCOPIC-ADD ON

## 2014-04-27 LAB — COMPREHENSIVE METABOLIC PANEL
ALBUMIN: 3 g/dL — AB (ref 3.5–5.2)
ALT: 7 U/L (ref 0–35)
AST: 10 U/L (ref 0–37)
Alkaline Phosphatase: 63 U/L (ref 39–117)
BILIRUBIN TOTAL: 0.3 mg/dL (ref 0.3–1.2)
BUN: 8 mg/dL (ref 6–23)
CALCIUM: 9.4 mg/dL (ref 8.4–10.5)
CHLORIDE: 96 meq/L (ref 96–112)
CO2: 24 mEq/L (ref 19–32)
Creatinine, Ser: 0.77 mg/dL (ref 0.50–1.10)
GFR calc Af Amer: >90 mL/min (ref >90)
GFR calc non Af Amer: >90 mL/min (ref >90)
Glucose, Bld: 101 mg/dL — ABNORMAL HIGH (ref 70–99)
Potassium: 3.8 mEq/L (ref 3.7–5.3)
Sodium: 137 mEq/L (ref 137–147)
TOTAL PROTEIN: 8.4 g/dL — AB (ref 6.0–8.3)

## 2014-04-27 LAB — LIPASE, BLOOD: LIPASE: 24 U/L (ref 11–59)

## 2014-04-27 LAB — PREGNANCY, URINE: PREG TEST UR: NEGATIVE

## 2014-04-27 MED ORDER — MORPHINE SULFATE 4 MG/ML IJ SOLN
4.0000 mg | Freq: Once | INTRAMUSCULAR | Status: AC
  Administered 2014-04-27: 4 mg via INTRAVENOUS
  Filled 2014-04-27: qty 1

## 2014-04-27 MED ORDER — ONDANSETRON HCL 4 MG/2ML IJ SOLN
4.0000 mg | Freq: Once | INTRAMUSCULAR | Status: AC
  Administered 2014-04-27: 4 mg via INTRAVENOUS
  Filled 2014-04-27: qty 2

## 2014-04-27 MED ORDER — POTASSIUM CHLORIDE IN NACL 20-0.45 MEQ/L-% IV SOLN
INTRAVENOUS | Status: DC
  Administered 2014-04-27 – 2014-04-29 (×5): via INTRAVENOUS
  Filled 2014-04-27 (×8): qty 1000

## 2014-04-27 MED ORDER — ACETAMINOPHEN 650 MG RE SUPP: 650.0000 mg | Freq: Four times a day (QID) | RECTAL | Status: DC | PRN

## 2014-04-27 MED ORDER — HYDROMORPHONE HCL PF 1 MG/ML IJ SOLN
1.0000 mg | INTRAMUSCULAR | Status: AC | PRN
  Administered 2014-04-27 (×2): 1 mg via INTRAVENOUS
  Filled 2014-04-27 (×2): qty 1

## 2014-04-27 MED ORDER — IOHEXOL 300 MG/ML  SOLN
25.0000 mL | INTRAMUSCULAR | Status: DC
  Administered 2014-04-27: 25 mL via ORAL

## 2014-04-27 MED ORDER — MORPHINE SULFATE 2 MG/ML IJ SOLN
2.0000 mg | INTRAMUSCULAR | Status: DC | PRN
  Filled 2014-04-27: qty 1

## 2014-04-27 MED ORDER — PIPERACILLIN-TAZOBACTAM 3.375 G IVPB
3.3750 g | Freq: Three times a day (TID) | INTRAVENOUS | Status: DC
  Administered 2014-04-27 – 2014-04-30 (×9): 3.375 g via INTRAVENOUS
  Filled 2014-04-27 (×12): qty 50

## 2014-04-27 MED ORDER — IOHEXOL 300 MG/ML  SOLN
100.0000 mL | Freq: Once | INTRAMUSCULAR | Status: AC | PRN
  Administered 2014-04-27: 100 mL via INTRAVENOUS

## 2014-04-27 MED ORDER — SODIUM CHLORIDE 0.9 % IV BOLUS (SEPSIS)
1000.0000 mL | Freq: Once | INTRAVENOUS | Status: AC
  Administered 2014-04-27: 1000 mL via INTRAVENOUS

## 2014-04-27 MED ORDER — FENTANYL CITRATE 0.05 MG/ML IJ SOLN
50.0000 ug | Freq: Once | INTRAMUSCULAR | Status: AC
  Administered 2014-04-27: 50 ug via INTRAVENOUS
  Filled 2014-04-27: qty 2

## 2014-04-27 MED ORDER — METHYLPREDNISOLONE SODIUM SUCC 125 MG IJ SOLR
60.0000 mg | Freq: Every day | INTRAMUSCULAR | Status: DC
  Administered 2014-04-27 – 2014-04-28 (×2): 60 mg via INTRAVENOUS
  Filled 2014-04-27 (×3): qty 0.96

## 2014-04-27 MED ORDER — ONDANSETRON HCL 4 MG PO TABS
4.0000 mg | ORAL_TABLET | Freq: Four times a day (QID) | ORAL | Status: DC
  Administered 2014-04-27 – 2014-04-28 (×6): 4 mg via ORAL
  Filled 2014-04-27 (×10): qty 1

## 2014-04-27 MED ORDER — ONDANSETRON HCL 4 MG/2ML IJ SOLN: 4.0000 mg | Freq: Three times a day (TID) | INTRAMUSCULAR | Status: AC | PRN

## 2014-04-27 MED ORDER — ONDANSETRON HCL 4 MG/2ML IJ SOLN: 4.0000 mg | Freq: Four times a day (QID) | INTRAMUSCULAR | Status: DC

## 2014-04-27 MED ORDER — PIPERACILLIN-TAZOBACTAM 3.375 G IVPB 30 MIN
3.3750 g | Freq: Once | INTRAVENOUS | Status: AC
  Administered 2014-04-27: 3.375 g via INTRAVENOUS
  Filled 2014-04-27: qty 50

## 2014-04-27 MED ORDER — PANTOPRAZOLE SODIUM 40 MG IV SOLR
40.0000 mg | INTRAVENOUS | Status: DC
  Administered 2014-04-27 – 2014-04-28 (×2): 40 mg via INTRAVENOUS
  Filled 2014-04-27 (×4): qty 40

## 2014-04-27 MED ORDER — HEPARIN SODIUM (PORCINE) 5000 UNIT/ML IJ SOLN
5000.0000 [IU] | Freq: Three times a day (TID) | INTRAMUSCULAR | Status: DC
  Administered 2014-04-27 – 2014-04-28 (×3): 5000 [IU] via SUBCUTANEOUS
  Filled 2014-04-27 (×13): qty 1

## 2014-04-27 MED ORDER — PROMETHAZINE HCL 25 MG/ML IJ SOLN: 25.0000 mg | Freq: Four times a day (QID) | INTRAMUSCULAR | Status: DC | PRN

## 2014-04-27 MED ORDER — ACETAMINOPHEN 325 MG PO TABS
650.0000 mg | ORAL_TABLET | Freq: Four times a day (QID) | ORAL | Status: DC | PRN
  Administered 2014-04-30: 650 mg via ORAL
  Filled 2014-04-27: qty 2

## 2014-04-27 NOTE — ED Notes (Signed)
Patient being transferred to [facility] at this time

## 2014-04-27 NOTE — H&P (Signed)
Family Medicine Teaching [facility] Admission History and Physical Service Pager: [phone]  Patient name: Gina Gonzalez Medical record number: [id number] Date of birth: 1981-07-10 Age: 33 y.o. Gender: female  Primary Care Provider: Kevin Fenton, MD Consultants: surgery, GI Code Status: Full  Chief Complaint: abdominal pain  Assessment and Plan: Gina Gonzalez is a 33 y.o. female presenting with abdominal pain, nausea, vomiting.  Found to have possible abscess on CT scan after failing outpatient management of enteritis/Chrohn's flare. PMH is significant for Chrohn's disease.  # Enteritis and possible abscess: She failed treatment as an outpatient with cipro and flagyl.  CT now concerning for abscess and worsening enteritis from 11/2013.  White count elevated. - admit to Judith Blonder attending - NPO - 1/2NS + 20KCl at 125cc/hr - zosyn per pharmacy - IV solumedrol 60mg  daily while NPO - scheduled zofran - phenergan prn - morphine 2mg  q2hr prn for pain control - monitor fever curve and white count - surgery consulted for abscess  # Chrohn's disease: She follows with Dr. Elnoria Howard, although she has not seen him for quite some time.  Reports colonoscopy to confirm diagnosis earlier this year.  On prednisone 5mg  daily. - switch to IV solumedrol while NPO as above - Dr. Elnoria Howard notified and will see her  # Anemia: Stable, at baseline of 10.5. - continue to monitor  FEN/GI: NPO, 1/2NS +KCl at 125cc/hr Prophylaxis: SQ heparin, protonix  Disposition: admit to med surg, attending Gwendolyn Grant  History of Present Illness: Gina Gonzalez is a 33 y.o. female with Chrohn's disease presenting with 2-3 weeks of abdominal pain, nausea and vomiting.  She was seen in the clinic for this on 5/29 and given a 2 week course of cipro and flagyl.  At that time her prednisone was also increased to 10mg .  She did okay, but not great on the antibiotics.  She completed the antibiotics and went back to 5mg  of  prednisone on Friday.  Over the last few days, she has had worsening abdominal pain, nausea and vomiting.  She reports subjective fevers last week.  She does have diarrhea, but states this at her baseline.  There is no blood in the stool or emesis.  Abdominal pain is diffuse.  Her appetite has been decreased the last 2-3 days and she has had difficulty with fluids starting today.  She also reports some mild back pain and some intermittent dizziness the last day or so.   Review Of Systems: Per HPI with the following additions: Denies chest pain, shortness of breath, dysuria.  Otherwise 12 point review of systems was performed and was unremarkable.  Patient Active Problem List   Diagnosis Date Noted  . Crohn's colitis 04/27/2014  . Annual physical exam 02/17/2014  . Crohn's disease of ileum 10/23/2013  . Anemia 08/15/2012   Past Medical History: Past Medical History  Diagnosis Date  . Crohn disease   . Complication of anesthesia     "took a little while for me to come out of it"  . Family history of anesthesia complication     "takes Mom a little while to come out" (11/10/2013)  . Anemia 2013  . IPJASNKN(397.6)     "couple times/month" (11/10/2013)  . Perforated small intestine 07/29/2012  . Intestinal abscess 11/10/2013  . Crohn's disease 08/15/2012   Past Surgical History: Past Surgical History  Procedure Laterality Date  . Tonsilectomy, adenoidectomy, bilateral myringotomy and tubes Bilateral 1988  . Tonsillectomy  1988  . Colonoscopy N/A 11/17/2013    Procedure:  COLONOSCOPY;  Surgeon: Theda BelfastPatrick D Hung, MD;  [facility];  Service: Endoscopy;  Laterality: N/A;   Social History: History  Substance Use Topics  . Smoking status: Never Smoker   . Smokeless tobacco: Never Used  . Alcohol Use: Yes     Comment: 11/10/2013 "might have a drink a couple times/yr"   Additional social history: none  Please also refer to relevant sections of EMR.  Family History: Family History   Problem Relation Age of Onset  . Thyroid disease Mother   . Cancer Maternal Grandmother    Allergies and Medications: Allergies  Allergen Reactions  . Adhesive [Tape] Other (See Comments)    redness   No current facility-administered medications on file prior to encounter.   Current Outpatient Prescriptions on File Prior to Encounter  Medication Sig Dispense Refill  . HYDROcodone-acetaminophen (NORCO) 10-325 MG per tablet Take 0.5-1 tablets by mouth 4 (four) times daily as needed (pain).  30 tablet  0  . ibuprofen (ADVIL,MOTRIN) 200 MG tablet Take 800-1,000 mg by mouth 2 (two) times daily as needed (daily). For pain      . predniSONE (DELTASONE) 5 MG tablet Take 10mg  for 2 weeks then 5mg  daily until complete  45 tablet  0  . ciprofloxacin (CIPRO) 500 MG tablet Take 1 tablet (500 mg total) by mouth 2 (two) times daily.  28 tablet  0  . metroNIDAZOLE (FLAGYL) 500 MG tablet Take 1 tablet (500 mg total) by mouth 2 (two) times daily.  28 tablet  0    Objective: BP 92/66  Pulse 90  Temp(Src) 98.2 F (36.8 C) (Oral)  Resp 16  SpO2 100%  LMP 03/29/2014 Exam: General: alert, cooperative, vomiting HEENT: AT/, sclera white, PERRL, MMM Cardiovascular: RRR, no murmurs Respiratory: CTAB, no wheezes or rales Abdomen: +BS, soft, ND, tender mostly in epigastric region, no rebound or guarding Extremities: 2+ DP and radial pulses; no edema Skin: no rashes Neuro: alert, oriented, gait normal  Labs and Imaging: CBC BMET   Recent Labs Lab 04/27/14 0210  WBC 16.3*  HGB 10.5*  HCT 32.2*  PLT 572*    Recent Labs Lab 04/27/14 0210  NA 137  K 3.8  CL 96  CO2 24  BUN 8  CREATININE 0.77  GLUCOSE 101*  CALCIUM 9.4     Lipase: 24  UA: protein and trace LE  CT Abd/pelvis: 1. Progressive central mesenteric inflammatory changes with diffuse  bowel wall thickening and mild proximal bowel distension. There is  suspicion of an ill-defined mesenteric extraluminal fluid collection   concerning for interloop abscess. No high-grade bowel obstruction or  free intraperitoneal air .  2. Reactive mesenteric lymphadenopathy with increased ascites.  3. Stable appearance of the solid parenchymal organs.   Charm RingsErin J Lucian Baswell, MD 04/27/2014, 8:38 AM PGY-3, [facility] FPTS Intern pager: [phone], text pages welcome

## 2014-04-27 NOTE — Consult Note (Signed)
Unassigned Consult  Reason for Consult: Crohn's disease Referring Physician: Family Medicine  Gina Gonzalez HPI: This is a 33 year old female with a PMH of Crohn's disease.  I saw her as an unassigned patient in 11/2013, but she never followed up with me in the office for financial reasons.  I have not seen the patient since her Dec/Jan admission.  She is readmitted as she complained of worsening abdominal pain.  In the past she was noted to have interloop abscesses in the RLQ and her current CT scan suggests another abscess.  She does have issues with fever, chills, nausea, and vomiting.  A course of Cipro and Flagyl were tried for 2 weeks without any significant benefit.  At this time she was started on Zosyn and Solumedrol.  As an outpatient her PCP was managing her Crohn's with prednisone.  At one point she was only on 5 mg of prednisone and she was well for a number of months.  Her current pain occurred rather acutely.  Past Medical History  Diagnosis Date  . Crohn disease   . Complication of anesthesia     "took a little while for me to come out of it"  . Family history of anesthesia complication     "takes Mom a little while to come out" (11/10/2013)  . Anemia 2013  . WUJWJXBJ(478.2)     "couple times/month" (11/10/2013)  . Perforated small intestine 07/29/2012  . Intestinal abscess 11/10/2013  . Crohn's disease 08/15/2012    Past Surgical History  Procedure Laterality Date  . Tonsilectomy, adenoidectomy, bilateral myringotomy and tubes Bilateral 1988  . Tonsillectomy  1988  . Colonoscopy N/A 11/17/2013    Procedure: COLONOSCOPY;  Surgeon: Theda Belfast, MD;  Location: [facility] ENDOSCOPY;  Service: Endoscopy;  Laterality: N/A;    Family History  Problem Relation Age of Onset  . Thyroid disease Mother   . Cancer Maternal Grandmother   . Pulmonary embolism Father   . Deep vein thrombosis Father     Social History:  reports that she has never smoked. She has never used smokeless  tobacco. She reports that she drinks alcohol. She reports that she does not use illicit drugs.  Allergies:  Allergies  Allergen Reactions  . Adhesive [Tape] Other (See Comments)    redness    Medications:  Scheduled: . heparin  5,000 Units Subcutaneous 3 times per day  . methylPREDNISolone (SOLU-MEDROL) injection  60 mg Intravenous Daily  . ondansetron  4 mg Oral 4 times per day   Or  . ondansetron (ZOFRAN) IV  4 mg Intravenous 4 times per day  . pantoprazole (PROTONIX) IV  40 mg Intravenous Q24H  . piperacillin-tazobactam (ZOSYN)  IV  3.375 g Intravenous Q8H   Continuous: . 0.45 % NaCl with KCl 20 mEq / L 125 mL/hr at 04/27/14 1150    Results for orders placed during the hospital encounter of 04/27/14 (from the past 24 hour(s))  CBC WITH DIFFERENTIAL     Status: Abnormal   Collection Time    04/27/14  2:10 AM      Result Value Ref Range   WBC 16.3 (*) 4.0 - 10.5 K/uL   RBC 3.79 (*) 3.87 - 5.11 MIL/uL   Hemoglobin 10.5 (*) 12.0 - 15.0 g/dL   HCT 95.6 (*) 21.3 - 08.6 %   MCV 85.0  78.0 - 100.0 fL   MCH 27.7  26.0 - 34.0 pg   MCHC 32.6  30.0 - 36.0 g/dL  RDW 13.5  11.5 - 15.5 %   Platelets 572 (*) 150 - 400 K/uL   Neutrophils Relative % 82 (*) 43 - 77 %   Neutro Abs 13.4 (*) 1.7 - 7.7 K/uL   Lymphocytes Relative 11 (*) 12 - 46 %   Lymphs Abs 1.8  0.7 - 4.0 K/uL   Monocytes Relative 7  3 - 12 %   Monocytes Absolute 1.1 (*) 0.1 - 1.0 K/uL   Eosinophils Relative 0  0 - 5 %   Eosinophils Absolute 0.0  0.0 - 0.7 K/uL   Basophils Relative 0  0 - 1 %   Basophils Absolute 0.0  0.0 - 0.1 K/uL  COMPREHENSIVE METABOLIC PANEL     Status: Abnormal   Collection Time    04/27/14  2:10 AM      Result Value Ref Range   Sodium 137  137 - 147 mEq/L   Potassium 3.8  3.7 - 5.3 mEq/L   Chloride 96  96 - 112 mEq/L   CO2 24  19 - 32 mEq/L   Glucose, Bld 101 (*) 70 - 99 mg/dL   BUN 8  6 - 23 mg/dL   Creatinine, Ser 5.63  0.50 - 1.10 mg/dL   Calcium 9.4  8.4 - 87.5 mg/dL   Total  Protein 8.4 (*) 6.0 - 8.3 g/dL   Albumin 3.0 (*) 3.5 - 5.2 g/dL   AST 10  0 - 37 U/L   ALT 7  0 - 35 U/L   Alkaline Phosphatase 63  39 - 117 U/L   Total Bilirubin 0.3  0.3 - 1.2 mg/dL   GFR calc non Af Amer >90  >90 mL/min   GFR calc Af Amer >90  >90 mL/min  LIPASE, BLOOD     Status: None   Collection Time    04/27/14  2:10 AM      Result Value Ref Range   Lipase 24  11 - 59 U/L  PREGNANCY, URINE     Status: None   Collection Time    04/27/14  3:03 AM      Result Value Ref Range   Preg Test, Ur NEGATIVE  NEGATIVE  URINALYSIS, ROUTINE W REFLEX MICROSCOPIC     Status: Abnormal   Collection Time    04/27/14  3:03 AM      Result Value Ref Range   Color, Urine YELLOW  YELLOW   APPearance CLOUDY (*) CLEAR   Specific Gravity, Urine 1.021  1.005 - 1.030   pH 8.5 (*) 5.0 - 8.0   Glucose, UA NEGATIVE  NEGATIVE mg/dL   Hgb urine dipstick NEGATIVE  NEGATIVE   Bilirubin Urine NEGATIVE  NEGATIVE   Ketones, ur NEGATIVE  NEGATIVE mg/dL   Protein, ur 30 (*) NEGATIVE mg/dL   Urobilinogen, UA 0.2  0.0 - 1.0 mg/dL   Nitrite NEGATIVE  NEGATIVE   Leukocytes, UA TRACE (*) NEGATIVE  URINE MICROSCOPIC-ADD ON     Status: Abnormal   Collection Time    04/27/14  3:03 AM      Result Value Ref Range   Squamous Epithelial / LPF MANY (*) RARE   WBC, UA 3-6  <3 WBC/hpf   RBC / HPF 0-2  <3 RBC/hpf   Bacteria, UA FEW (*) RARE   Casts HYALINE CASTS (*) NEGATIVE   Urine-Other MUCOUS PRESENT    CBC     Status: Abnormal   Collection Time    04/27/14 10:01 AM  Result Value Ref Range   WBC 14.6 (*) 4.0 - 10.5 K/uL   RBC 3.67 (*) 3.87 - 5.11 MIL/uL   Hemoglobin 10.0 (*) 12.0 - 15.0 g/dL   HCT 40.931.6 (*) 81.136.0 - 91.446.0 %   MCV 86.1  78.0 - 100.0 fL   MCH 27.2  26.0 - 34.0 pg   MCHC 31.6  30.0 - 36.0 g/dL   RDW 78.213.5  95.611.5 - 21.315.5 %   Platelets 463 (*) 150 - 400 K/uL  CREATININE, SERUM     Status: None   Collection Time    04/27/14 10:01 AM      Result Value Ref Range   Creatinine, Ser 0.70  0.50 -  1.10 mg/dL   GFR calc non Af Amer >90  >90 mL/min   GFR calc Af Amer >90  >90 mL/min     Ct Abdomen Pelvis W Contrast  04/27/2014   CLINICAL DATA:  Abdominal pain with vomiting and diarrhea. History of Crohn's disease.  EXAM: CT ABDOMEN AND PELVIS WITH CONTRAST  TECHNIQUE: Multidetector CT imaging of the abdomen and pelvis was performed using the standard protocol following bolus administration of intravenous contrast.  CONTRAST:  100mL OMNIPAQUE IOHEXOL 300 MG/ML  SOLN  COMPARISON:  Prior examinations 11/15/2013 and 11/10/2013.  FINDINGS: The lung bases are clear. There is no significant pleural or pericardial effusion. A small hiatal hernia is noted.  The liver, gallbladder, biliary system, pancreas, spleen, adrenal glands and kidneys are stable without significant findings.  Limited enteric contrast was administered, confined to the stomach. Extensive central mesenteric inflammatory changes have progressed compared with the prior examination. There is diffuse wall thickening of the mid to distal small bowel associated with progressive proximal small bowel distension. Although not definitive due to the limited enteric contrast, there is suspicion of an ill-defined central mesenteric extraluminal fluid collection, measuring approximately 4.6 x 4.0 cm on image 54. No other extraluminal fluid collections are identified. There are multiple prominent mesenteric lymph nodes, likely reactive. There is mild proximal colonic wall thickening. A moderate amount of pelvic ascites has increased in volume, and there is a small amount of perihepatic ascites. There is no free intraperitoneal air.  There are no significant vascular findings. The uterus, ovaries and bladder appear unremarkable.  There are no acute osseous findings. There are probable changes of bilateral sacroiliitis.  IMPRESSION: 1. Progressive central mesenteric inflammatory changes with diffuse bowel wall thickening and mild proximal bowel distension.  There is suspicion of an ill-defined mesenteric extraluminal fluid collection concerning for interloop abscess. No high-grade bowel obstruction or free intraperitoneal air . 2. Reactive mesenteric lymphadenopathy with increased ascites. 3. Stable appearance of the solid parenchymal organs.   Electronically Signed   By: Roxy HorsemanBill  Veazey M.D.   On: 04/27/2014 07:24    ROS:  As stated above in the HPI otherwise negative.  Blood pressure 113/75, pulse 93, temperature 99.2 F (37.3 C), temperature source Oral, resp. rate 16, height 5\' 7"  (1.702 m), weight 249 lb 9.6 oz (113.218 kg), last menstrual period 03/29/2014, SpO2 97.00%.    PE: Gen: NAD, Alert and Oriented HEENT:  [place]/AT, EOMI Neck: Supple, no LAD Lungs: CTA Bilaterally CV: RRR without M/G/R ABM: Soft, NTND, +BS Ext: No C/C/E  Assessment/Plan: 1) Crohn's disease. 2) Probable abdominal abscess.   Her current presentation is consistent with her prior presentation of Crohn's disease.  The biopsies of the TI revealed minimal inflammation, but what was identified was consistent with Crohn's.  I think she will  be best served with using Humira, but she does not have insurance.  She will try to obtain insurance with Medicaid again.  In the meantime, I think another good alternative treatment for her will be azathioprine.  This can potentially control and put her into remission once her infectious issues have resolved.  Plan: 1) Continue with antibiotics. 2) Continue with Solumedrol. 3) Check TPMT.  HUNG,PATRICK D 04/27/2014, 12:19 PM

## 2014-04-27 NOTE — Consult Note (Signed)
I saw the patient, participated in the history, exam and medical decision making, and concur with the physician assistant's note above.  Sleeping, nad, nontoxic Obese Soft, nd, mild ttp throughout. No rt/guarding  Bowel rest Iv abx meds for Crohns flare per GI GI consult.   Pt states she has been eating very well despite pain so I would hold off on TPN for now  Mary SellaEric M. Andrey CampanileWilson, MD, FACS General, Bariatric, & Minimally Invasive Surgery [facility] [place]

## 2014-04-27 NOTE — H&P (Signed)
FMTS Attending Admission Note: Renold Don MD Personal pager:  [phone] FPTS Service Pager:  [phone]  I  have seen and examined this patient, reviewed their chart. I have discussed this patient with the resident. I agree with the resident's findings, assessment and care plan.   Tobey Grim, MD 04/27/2014 1:43 PM

## 2014-04-27 NOTE — Progress Notes (Signed)
Pt. Admitted to 5W 35 from ED via stretcher, place in bed and text paged Dr. Gwendolyn Grant to inform of pt. Arrival.  Will continue to monitor.  Forbes Cellar, RN

## 2014-04-27 NOTE — ED Notes (Signed)
CT notified that the pt has finished her contrast.  

## 2014-04-27 NOTE — ED Notes (Signed)
Family practice at bedside.

## 2014-04-27 NOTE — Progress Notes (Signed)
Gina Gonzalez is a 33 y.o. female with a pmh for chron's disease admitted to service for a presumed chron's flare, with CT positive progressive central mesenteric inflammatory changes with diffuse bowel wall thickening and mild proximal bowel distension. There is suspicion of an ill-defined mesenteric extraluminal fluid collection concerning for interloop abscess.  Pt is sitting on bed asking to eat. She states she has been drinking liquid throughout the day and had and "icy."   BP 113/75  Pulse 93  Temp(Src) 99.2 F (37.3 C) (Oral)  Resp 16  Ht 5\' 7"  (1.702 m)  Wt 249 lb 9.6 oz (113.218 kg)  BMI 39.08 kg/m2  SpO2 97%  LMP 03/29/2014 Gen: Pt is sitting cross legged on bed. NAD. Pleasant.  Abd: Soft. Mild diffuse tenderness (Pt just received pain medications). ND. No masses palpated.   - Pt needs to remain NPO/bowel rest per surgery and GI notes.  - Pt appears to be pain controlled with medications.  - Appreciate surgery and GI recommendations.  - Continue current plan with zosyn. - repeat labs in the morning.  Gonzalez, Renee DO

## 2014-04-27 NOTE — Consult Note (Signed)
Reason for Consult: small bowel inflammation, possible abscess Referring Physician:  Dr. Maryruth Eve   HPI: Gina Gonzalez is a 33 year old female with presumed Crohn's disease who presents today with worsening abdominal pain.  Duration of symptoms is 2 weeks.  Onset was gradual.  Coarse is worsening.  Time pattern is constant.  Associated with chills, presumed fevers, nausea and vomiting.  She was seen by her PCP and started on cipro and flagyl x2 weeks which she completed on Friday.  Her prednisone was also increased from 73m to 151mdaily.  She reports worsening pain since last night.  Her  Appetite has been adequate.  No weight loss.  She denies diarrhea, melena or hematochezia.  She unfortunately has not been seen by Dr. HuBenson Norwayince her hospitalization in January.  She had a colonoscopy on 11/17/13, I am unable to locate the biopsy results confirming Crohn's disease.  A CT of abdomen and pelvis today revealed progressive central mesenteric inflammatory changes, diffuse bowel thickening and a possible fluid collection.  White count is down from 16.3K to 14.6k. She has been started on zosyn and solumedrol.  We have been asked to evaluate the patient for the intra-abdominal abscess.    Past Medical History  Diagnosis Date  . Crohn disease   . Complication of anesthesia     "took a little while for me to come out of it"  . Family history of anesthesia complication     "takes Mom a little while to come out" (11/10/2013)  . Anemia 2013  . HePJKDTOIZ(124.5    "couple times/month" (11/10/2013)  . Perforated small intestine 07/29/2012  . Intestinal abscess 11/10/2013  . Crohn's disease 08/15/2012    Past Surgical History  Procedure Laterality Date  . Tonsilectomy, adenoidectomy, bilateral myringotomy and tubes Bilateral 1988  . Tonsillectomy  1988  . Colonoscopy N/A 11/17/2013    Procedure: COLONOSCOPY;  Surgeon: PaBeryle BeamsMD;  [facility] [facility] Service: End[facility];  Laterality: N/A;     Family History  Problem Relation Age of Onset  . Thyroid disease Mother   . Cancer Maternal Grandmother   . Pulmonary embolism Father   . Deep vein thrombosis Father     Social History:  reports that she has never smoked. She has never used smokeless tobacco. She reports that she drinks alcohol. She reports that she does not use illicit drugs.  Allergies:  Allergies  Allergen Reactions  . Adhesive [Tape] Other (See Comments)    redness    Medications:  Scheduled Meds: . heparin  5,000 Units Subcutaneous 3 times per day  . methylPREDNISolone (SOLU-MEDROL) injection  60 mg Intravenous Daily  . ondansetron  4 mg Oral 4 times per day   Or  . ondansetron (ZOFRAN) IV  4 mg Intravenous 4 times per day  . pantoprazole (PROTONIX) IV  40 mg Intravenous Q24H  . piperacillin-tazobactam (ZOSYN)  IV  3.375 g Intravenous Q8H   Continuous Infusions: . 0.45 % NaCl with KCl 20 mEq / L     PRN Meds:.acetaminophen, acetaminophen, HYDROmorphone (DILAUDID) injection, morphine injection, ondansetron (ZOFRAN) IV, promethazine   Results for orders placed during the hospital encounter of 04/27/14 (from the past 48 hour(s))  CBC WITH DIFFERENTIAL     Status: Abnormal   Collection Time    04/27/14  2:10 AM      Result Value Ref Range   WBC 16.3 (*) 4.0 - 10.5 K/uL   RBC 3.79 (*) 3.87 - 5.11 MIL/uL  Hemoglobin 10.5 (*) 12.0 - 15.0 g/dL   HCT 32.2 (*) 36.0 - 46.0 %   MCV 85.0  78.0 - 100.0 fL   MCH 27.7  26.0 - 34.0 pg   MCHC 32.6  30.0 - 36.0 g/dL   RDW 13.5  11.5 - 15.5 %   Platelets 572 (*) 150 - 400 K/uL   Neutrophils Relative % 82 (*) 43 - 77 %   Neutro Abs 13.4 (*) 1.7 - 7.7 K/uL   Lymphocytes Relative 11 (*) 12 - 46 %   Lymphs Abs 1.8  0.7 - 4.0 K/uL   Monocytes Relative 7  3 - 12 %   Monocytes Absolute 1.1 (*) 0.1 - 1.0 K/uL   Eosinophils Relative 0  0 - 5 %   Eosinophils Absolute 0.0  0.0 - 0.7 K/uL   Basophils Relative 0  0 - 1 %   Basophils Absolute 0.0  0.0 - 0.1 K/uL   COMPREHENSIVE METABOLIC PANEL     Status: Abnormal   Collection Time    04/27/14  2:10 AM      Result Value Ref Range   Sodium 137  137 - 147 mEq/L   Potassium 3.8  3.7 - 5.3 mEq/L   Chloride 96  96 - 112 mEq/L   CO2 24  19 - 32 mEq/L   Glucose, Bld 101 (*) 70 - 99 mg/dL   BUN 8  6 - 23 mg/dL   Creatinine, Ser 0.77  0.50 - 1.10 mg/dL   Calcium 9.4  8.4 - 10.5 mg/dL   Total Protein 8.4 (*) 6.0 - 8.3 g/dL   Albumin 3.0 (*) 3.5 - 5.2 g/dL   AST 10  0 - 37 U/L   ALT 7  0 - 35 U/L   Alkaline Phosphatase 63  39 - 117 U/L   Total Bilirubin 0.3  0.3 - 1.2 mg/dL   GFR calc non Af Amer >90  >90 mL/min   GFR calc Af Amer >90  >90 mL/min   Comment: (NOTE)     The eGFR has been calculated using the CKD EPI equation.     This calculation has not been validated in all clinical situations.     eGFR's persistently <90 mL/min signify possible Chronic Kidney     Disease.  LIPASE, BLOOD     Status: None   Collection Time    04/27/14  2:10 AM      Result Value Ref Range   Lipase 24  11 - 59 U/L  PREGNANCY, URINE     Status: None   Collection Time    04/27/14  3:03 AM      Result Value Ref Range   Preg Test, Ur NEGATIVE  NEGATIVE   Comment:            THE SENSITIVITY OF THIS     METHODOLOGY IS >20 mIU/mL.  URINALYSIS, ROUTINE W REFLEX MICROSCOPIC     Status: Abnormal   Collection Time    04/27/14  3:03 AM      Result Value Ref Range   Color, Urine YELLOW  YELLOW   APPearance CLOUDY (*) CLEAR   Specific Gravity, Urine 1.021  1.005 - 1.030   pH 8.5 (*) 5.0 - 8.0   Glucose, UA NEGATIVE  NEGATIVE mg/dL   Hgb urine dipstick NEGATIVE  NEGATIVE   Bilirubin Urine NEGATIVE  NEGATIVE   Ketones, ur NEGATIVE  NEGATIVE mg/dL   Protein, ur 30 (*) NEGATIVE mg/dL   Urobilinogen,  UA 0.2  0.0 - 1.0 mg/dL   Nitrite NEGATIVE  NEGATIVE   Leukocytes, UA TRACE (*) NEGATIVE  URINE MICROSCOPIC-ADD ON     Status: Abnormal   Collection Time    04/27/14  3:03 AM      Result Value Ref Range   Squamous  Epithelial / LPF MANY (*) RARE   WBC, UA 3-6  <3 WBC/hpf   RBC / HPF 0-2  <3 RBC/hpf   Bacteria, UA FEW (*) RARE   Casts HYALINE CASTS (*) NEGATIVE   Urine-Other MUCOUS PRESENT      Ct Abdomen Pelvis W Contrast  04/27/2014   CLINICAL DATA:  Abdominal pain with vomiting and diarrhea. History of Crohn's disease.  EXAM: CT ABDOMEN AND PELVIS WITH CONTRAST  TECHNIQUE: Multidetector CT imaging of the abdomen and pelvis was performed using the standard protocol following bolus administration of intravenous contrast.  CONTRAST:  179m OMNIPAQUE IOHEXOL 300 MG/ML  SOLN  COMPARISON:  Prior examinations 11/15/2013 and 11/10/2013.  FINDINGS: The lung bases are clear. There is no significant pleural or pericardial effusion. A small hiatal hernia is noted.  The liver, gallbladder, biliary system, pancreas, spleen, adrenal glands and kidneys are stable without significant findings.  Limited enteric contrast was administered, confined to the stomach. Extensive central mesenteric inflammatory changes have progressed compared with the prior examination. There is diffuse wall thickening of the mid to distal small bowel associated with progressive proximal small bowel distension. Although not definitive due to the limited enteric contrast, there is suspicion of an ill-defined central mesenteric extraluminal fluid collection, measuring approximately 4.6 x 4.0 cm on image 54. No other extraluminal fluid collections are identified. There are multiple prominent mesenteric lymph nodes, likely reactive. There is mild proximal colonic wall thickening. A moderate amount of pelvic ascites has increased in volume, and there is a small amount of perihepatic ascites. There is no free intraperitoneal air.  There are no significant vascular findings. The uterus, ovaries and bladder appear unremarkable.  There are no acute osseous findings. There are probable changes of bilateral sacroiliitis.  IMPRESSION: 1. Progressive central mesenteric  inflammatory changes with diffuse bowel wall thickening and mild proximal bowel distension. There is suspicion of an ill-defined mesenteric extraluminal fluid collection concerning for interloop abscess. No high-grade bowel obstruction or free intraperitoneal air . 2. Reactive mesenteric lymphadenopathy with increased ascites. 3. Stable appearance of the solid parenchymal organs.   Electronically Signed   By: BCamie PatienceM.D.   On: 04/27/2014 07:24    Review of Systems  All other systems reviewed and are negative.  Blood pressure 113/75, pulse 93, temperature 99.2 F (37.3 C), temperature source Oral, resp. rate 16, height 5' 7"  (1.702 m), weight 249 lb 9.6 oz (113.218 kg), last menstrual period 03/29/2014, SpO2 97.00%. Physical Exam  Constitutional: She is oriented to person, place, and time. She appears well-developed and well-nourished. No distress.  Cardiovascular: Normal rate, regular rhythm, normal heart sounds and intact distal pulses.  Exam reveals no gallop and no friction rub.   No murmur heard. Respiratory: Effort normal and breath sounds normal. No respiratory distress. She has no wheezes. She has no rales. She exhibits no tenderness.  GI: Soft. Bowel sounds are normal. She exhibits no distension and no mass. There is tenderness. There is no rebound and no guarding.  Musculoskeletal: Normal range of motion.  Neurological: She is alert and oriented to person, place, and time.  Skin: Skin is warm and dry. No rash noted. She is not  diaphoretic. No erythema. No pallor.  Psychiatric: She has a normal mood and affect. Her behavior is normal. Judgment and thought content normal.    Assessment/Plan: Presumed crohn's disease small bowel inflammation, possible abscess Reactive lymphadenopathy  Recommend bowel rest.  Agree with IV antibiotics.  Consider starting alternative nutrition if she does not improve soon.  Recommend consult to Dr. Benson Norway to weigh.  I will discuss with IR if the  abscess is conducive to drain placement. Surgery will continue to follow.  Thank you for the consult.   1204--I discussed with Jannifer Franklin PA and Dr. Laurence Ferrari, the abscess is small and surrounded by bowel and therefore unable to be drained by IR.  Erby Pian ANP-BC Pager [phone] 04/27/2014, 10:25 AM

## 2014-04-27 NOTE — ED Notes (Addendum)
Pt states that she threw up her contrast. Pt denies nausea at this time.

## 2014-04-27 NOTE — ED Provider Notes (Signed)
CSN: [id number]     Arrival date & time 04/26/14  2334 History   First MD Initiated Contact with Patient 04/27/14 0347     Chief Complaint  Patient presents with  . Crohn's Disease     (Consider location/radiation/quality/duration/timing/severity/associated sxs/prior Treatment) HPI 33 year old female presents to the emergency department with complaint of 4 days of worsening abdominal pain, nausea vomiting diarrhea.  No fevers or chills.  Patient has history of Crohn's disease.  Patient ha has history of prior abdominal abscesses.  Patient recently was on a course of Cipro and Flagyl for 2 weeks stopping on Friday.  Antibiotics started as she was feeling unwell and was concerned for possible abscess.  She is followed both in the practice clinic.  She reports her last abscess was in her right lower abdomen, she reports pain diffusely throughout her abdomen.  No blood in emesis or stool. Past Medical History  Diagnosis Date  . Crohn disease   . Complication of anesthesia     "took a little while for me to come out of it"  . Family history of anesthesia complication     "takes Mom a little while to come out" (11/10/2013)  . Anemia 2013  . WUJWJXBJ(478.2Headache(784.0)     "couple times/month" (11/10/2013)  . Perforated small intestine 07/29/2012  . Intestinal abscess 11/10/2013  . Crohn's disease 08/15/2012   Past Surgical History  Procedure Laterality Date  . Tonsilectomy, adenoidectomy, bilateral myringotomy and tubes Bilateral 1988  . Tonsillectomy  1988  . Colonoscopy N/A 11/17/2013    Procedure: COLONOSCOPY;  Surgeon: Theda BelfastPatrick D Hung, MD;  [facility];  Service: Endoscopy;  Laterality: N/A;   Family History  Problem Relation Age of Onset  . Thyroid disease Mother   . Cancer Maternal Grandmother    History  Substance Use Topics  . Smoking status: Never Smoker   . Smokeless tobacco: Never Used  . Alcohol Use: Yes     Comment: 11/10/2013 "might have a drink a couple times/yr"   OB  History   Grav Para Term Preterm Abortions TAB SAB Ect Mult Living                 Review of Systems   See History of Present Illness; otherwise all other systems are reviewed and negative  Allergies  Adhesive  Home Medications   Prior to Admission medications   Medication Sig Start Date End Date Taking? Authorizing Amanda Pote  HYDROcodone-acetaminophen (NORCO) 10-325 MG per tablet Take 0.5-1 tablets by mouth 4 (four) times daily as needed (pain). 04/09/14  Yes Amber Nydia BoutonM Hairford, MD  ibuprofen (ADVIL,MOTRIN) 200 MG tablet Take 800-1,000 mg by mouth 2 (two) times daily as needed (daily). For pain   Yes Historical Maritsa Hunsucker, MD  predniSONE (DELTASONE) 5 MG tablet Take 10mg  for 2 weeks then 5mg  daily until complete 04/09/14  Yes Amber Nydia BoutonM Hairford, MD  ciprofloxacin (CIPRO) 500 MG tablet Take 1 tablet (500 mg total) by mouth 2 (two) times daily. 04/09/14   Amber Nydia BoutonM Hairford, MD  metroNIDAZOLE (FLAGYL) 500 MG tablet Take 1 tablet (500 mg total) by mouth 2 (two) times daily. 04/09/14   Amber Nydia BoutonM Hairford, MD   BP 117/58  Pulse 95  Temp(Src) 98.2 F (36.8 C) (Oral)  Resp 17  SpO2 100%  LMP 03/29/2014 Physical Exam  Nursing note and vitals reviewed. Constitutional: She is oriented to person, place, and time. She appears well-developed and well-nourished. She appears distressed (uncomfortable appearing).  HENT:  Head: Normocephalic and  atraumatic.  Nose: Nose normal.  Mouth/Throat: Oropharynx is clear and moist.  Eyes: Conjunctivae and EOM are normal. Pupils are equal, round, and reactive to light.  Neck: Normal range of motion. Neck supple. No JVD present. No tracheal deviation present. No thyromegaly present.  Cardiovascular: Normal rate, regular rhythm, normal heart sounds and intact distal pulses.  Exam reveals no gallop and no friction rub.   No murmur heard. Pulmonary/Chest: Effort normal and breath sounds normal. No stridor. No respiratory distress. She has no wheezes. She has no rales. She  exhibits no tenderness.  Abdominal: Soft. Bowel sounds are normal. She exhibits no distension and no mass. There is tenderness (significant tenderness in left upper quadrant). There is no rebound and no guarding.  Musculoskeletal: Normal range of motion. She exhibits no edema and no tenderness.  Lymphadenopathy:    She has no cervical adenopathy.  Neurological: She is alert and oriented to person, place, and time. She exhibits normal muscle tone. Coordination normal.  Skin: Skin is warm and dry. No rash noted. No erythema. No pallor.  Psychiatric: She has a normal mood and affect. Her behavior is normal. Judgment and thought content normal.    ED Course  Procedures (including critical care time) Labs Review Labs Reviewed  CBC WITH DIFFERENTIAL - Abnormal; Notable for the following:    WBC 16.3 (*)    RBC 3.79 (*)    Hemoglobin 10.5 (*)    HCT 32.2 (*)    Platelets 572 (*)    Neutrophils Relative % 82 (*)    Neutro Abs 13.4 (*)    Lymphocytes Relative 11 (*)    Monocytes Absolute 1.1 (*)    All other components within normal limits  COMPREHENSIVE METABOLIC PANEL - Abnormal; Notable for the following:    Glucose, Bld 101 (*)    Total Protein 8.4 (*)    Albumin 3.0 (*)    All other components within normal limits  URINALYSIS, ROUTINE W REFLEX MICROSCOPIC - Abnormal; Notable for the following:    APPearance CLOUDY (*)    pH 8.5 (*)    Protein, ur 30 (*)    Leukocytes, UA TRACE (*)    All other components within normal limits  URINE MICROSCOPIC-ADD ON - Abnormal; Notable for the following:    Squamous Epithelial / LPF MANY (*)    Bacteria, UA FEW (*)    Casts HYALINE CASTS (*)    All other components within normal limits  LIPASE, BLOOD  PREGNANCY, URINE    Imaging Review Ct Abdomen Pelvis W Contrast  04/27/2014   CLINICAL DATA:  Abdominal pain with vomiting and diarrhea. History of Crohn's disease.  EXAM: CT ABDOMEN AND PELVIS WITH CONTRAST  TECHNIQUE: Multidetector CT  imaging of the abdomen and pelvis was performed using the standard protocol following bolus administration of intravenous contrast.  CONTRAST:  OMNIPAQUE IOHEXOL 300 MG/ML  SOLN  COMPARISON:  Prior examinations 11/15/2013 and 11/10/2013.  FINDINGS: The lung bases are clear. There is no significant pleural or pericardial effusion. A small hiatal hernia is noted.  The liver, gallbladder, biliary system, pancreas, spleen, adrenal glands and kidneys are stable without significant findings.  Limited enteric contrast was administered, confined to the stomach. Extensive central mesenteric inflammatory changes have progressed compared with the prior examination. There is diffuse wall thickening of the mid to distal small bowel associated with progressive proximal small bowel distension. Although not definitive due to the limited enteric contrast, there is suspicion of an ill-defined central mesenteric  extraluminal fluid collection, measuring approximately 4.6 x 4.0 cm on image 54. No other extraluminal fluid collections are identified. There are multiple prominent mesenteric lymph nodes, likely reactive. There is mild proximal colonic wall thickening. A moderate amount of pelvic ascites has increased in volume, and there is a small amount of perihepatic ascites. There is no free intraperitoneal air.  There are no significant vascular findings. The uterus, ovaries and bladder appear unremarkable.  There are no acute osseous findings. There are probable changes of bilateral sacroiliitis.  IMPRESSION: 1. Progressive central mesenteric inflammatory changes with diffuse bowel wall thickening and mild proximal bowel distension. There is suspicion of an ill-defined mesenteric extraluminal fluid collection concerning for interloop abscess. No high-grade bowel obstruction or free intraperitoneal air . 2. Reactive mesenteric lymphadenopathy with increased ascites. 3. Stable appearance of the solid parenchymal organs.    Electronically Signed   By: Roxy Horseman M.D.   On: 04/27/2014 07:24     EKG Interpretation None      MDM   Final diagnoses:  Crohn's regional enteritis    33 year old female with Crohn's disease with flare.  CBC elevated.  Concern for an enteritis or possible abscess.  Plan for CT abdomen pelvis, expect patient will need admission.    Olivia Mackie, MD 04/27/14 [phone]

## 2014-04-27 NOTE — Progress Notes (Signed)
ANTIBIOTIC CONSULT NOTE - INITIAL  Pharmacy Consult for zosyn Indication: Intra-abdominal Infection   Allergies  Allergen Reactions  . Adhesive [Tape] Other (See Comments)    redness    Patient Measurements: Height: 5\' 7"  (170.2 cm) Weight: 249 lb 9.6 oz (113.218 kg) IBW/kg (Calculated) : 61.6   Vital Signs: Temp: 99.2 F (37.3 C) (06/16 0915) Temp src: Oral (06/16 0915) BP: 113/75 mmHg (06/16 0915) Pulse Rate: 93 (06/16 0915) Intake/Output from previous day:   Intake/Output from this shift:    Labs:  Recent Labs  04/27/14 0210  WBC 16.3*  HGB 10.5*  PLT 572*  CREATININE 0.77   Estimated Creatinine Clearance: 132.2 ml/min (by C-G formula based on Cr of 0.77). No results found for this basename: VANCOTROUGH, VANCOPEAK, VANCORANDOM, GENTTROUGH, GENTPEAK, GENTRANDOM, TOBRATROUGH, TOBRAPEAK, TOBRARND, AMIKACINPEAK, AMIKACINTROU, AMIKACIN,  in the last 72 hours   Microbiology: No results found for this or any previous visit (from the past 720 hour(s)).  Medical History: Past Medical History  Diagnosis Date  . Crohn disease   . Complication of anesthesia     "took a little while for me to come out of it"  . Family history of anesthesia complication     "takes Mom a little while to come out" (11/10/2013)  . Anemia 2013  . ZOXWRUEA(540.9)     "couple times/month" (11/10/2013)  . Perforated small intestine 07/29/2012  . Intestinal abscess 11/10/2013  . Crohn's disease 08/15/2012    Assessment: Patient is a 33 y.o F presented to te ED with c/o abdominal pain with CT suspicious for abscess. Patient received zosyn 3.375gm IV x1 in the ED (given at 0830 today).   Plan:  1) zosyn 3.375gm IV q8h (infuse over 4 hours)  Pham, Anh P 04/27/2014,9:58 AM

## 2014-04-28 DIAGNOSIS — D649 Anemia, unspecified: Secondary | ICD-10-CM

## 2014-04-28 LAB — COMPREHENSIVE METABOLIC PANEL
ALT: 6 U/L (ref 0–35)
AST: 8 U/L (ref 0–37)
Albumin: 2.4 g/dL — ABNORMAL LOW (ref 3.5–5.2)
Alkaline Phosphatase: 51 U/L (ref 39–117)
BILIRUBIN TOTAL: 0.2 mg/dL — AB (ref 0.3–1.2)
BUN: 9 mg/dL (ref 6–23)
CHLORIDE: 102 meq/L (ref 96–112)
CO2: 23 mEq/L (ref 19–32)
Calcium: 8.8 mg/dL (ref 8.4–10.5)
Creatinine, Ser: 0.59 mg/dL (ref 0.50–1.10)
GFR calc non Af Amer: >90 mL/min (ref >90)
GLUCOSE: 85 mg/dL (ref 70–99)
POTASSIUM: 3.6 meq/L — AB (ref 3.7–5.3)
Sodium: 140 mEq/L (ref 137–147)
Total Protein: 7.3 g/dL (ref 6.0–8.3)

## 2014-04-28 LAB — CBC WITH DIFFERENTIAL/PLATELET
Basophils Absolute: 0 10*3/uL (ref 0.0–0.1)
Basophils Relative: 0 % (ref 0–1)
EOS ABS: 0 10*3/uL (ref 0.0–0.7)
Eosinophils Relative: 0 % (ref 0–5)
HCT: 30 % — ABNORMAL LOW (ref 36.0–46.0)
HEMOGLOBIN: 9.3 g/dL — AB (ref 12.0–15.0)
LYMPHS ABS: 1.8 10*3/uL (ref 0.7–4.0)
Lymphocytes Relative: 15 % (ref 12–46)
MCH: 26.9 pg (ref 26.0–34.0)
MCHC: 31 g/dL (ref 30.0–36.0)
MCV: 86.7 fL (ref 78.0–100.0)
Monocytes Absolute: 0.7 10*3/uL (ref 0.1–1.0)
Monocytes Relative: 6 % (ref 3–12)
NEUTROS ABS: 9.6 10*3/uL — AB (ref 1.7–7.7)
NEUTROS PCT: 79 % — AB (ref 43–77)
Platelets: 463 10*3/uL — ABNORMAL HIGH (ref 150–400)
RBC: 3.46 MIL/uL — AB (ref 3.87–5.11)
RDW: 13.4 % (ref 11.5–15.5)
WBC: 12.1 10*3/uL — ABNORMAL HIGH (ref 4.0–10.5)

## 2014-04-28 MED ORDER — ONDANSETRON HCL 4 MG PO TABS: 4.0000 mg | ORAL_TABLET | Freq: Four times a day (QID) | ORAL | Status: DC | PRN

## 2014-04-28 MED ORDER — ONDANSETRON HCL 4 MG/2ML IJ SOLN: 4.0000 mg | Freq: Four times a day (QID) | INTRAMUSCULAR | Status: DC | PRN

## 2014-04-28 NOTE — Progress Notes (Signed)
Unassigned patient Subjective: Patient claim she is feeling much better. Wants to know when she can go home. She denies having any abdominal pain or nausea,. She is tolerating her diet well.   Objective: Vital signs in last 24 hours: Temp:  [98.3 F (36.8 C)-98.4 F (36.9 C)] 98.3 F (36.8 C) (06/17 1334) Pulse Rate:  [70-75] 75 (06/17 1334) Resp:  [18] 18 (06/17 1334) BP: (103-110)/(67) 110/67 mmHg (06/17 1334) SpO2:  [98 %-100 %] 98 % (06/17 1334) Weight:  [112.22 kg (247 lb 6.4 oz)] 112.22 kg (247 lb 6.4 oz) (06/17 0605) Last BM Date: 04/27/14  Intake/Output from previous day:   Intake/Output this shift: Total I/O In: 467 [P.O.:342; I.V.:125] Out: -   General appearance: alert, cooperative, appears stated age and no distress Resp: clear to auscultation bilaterally Cardio: regular rate and rhythm, S1, S2 normal, no murmur, click, rub or gallop GI: soft, non-tender; bowel sounds normal; no masses,  no organomegaly Extremities: extremities normal, atraumatic, no cyanosis or edema  Lab Results:  Recent Labs  04/27/14 0210 04/27/14 1001 04/28/14 0740  WBC 16.3* 14.6* 12.1*  HGB 10.5* 10.0* 9.3*  HCT 32.2* 31.6* 30.0*  PLT 572* 463* 463*   BMET  Recent Labs  04/27/14 0210 04/27/14 1001 04/28/14 0740  NA 137  --  140  K 3.8  --  3.6*  CL 96  --  102  CO2 24  --  23  GLUCOSE 101*  --  85  BUN 8  --  9  CREATININE 0.77 0.70 0.59  CALCIUM 9.4  --  8.8   LFT  Recent Labs  04/28/14 0740  PROT 7.3  ALBUMIN 2.4*  AST 8  ALT 6  ALKPHOS 51  BILITOT 0.2*   Studies/Results: Ct Abdomen Pelvis W Contrast  04/27/2014   CLINICAL DATA:  Abdominal pain with vomiting and diarrhea. History of Crohn's disease.  EXAM: CT ABDOMEN AND PELVIS WITH CONTRAST  TECHNIQUE: Multidetector CT imaging of the abdomen and pelvis was performed using the standard protocol following bolus administration of intravenous contrast.  CONTRAST:  100mL OMNIPAQUE IOHEXOL 300 MG/ML  SOLN   COMPARISON:  Prior examinations 11/15/2013 and 11/10/2013.  FINDINGS: The lung bases are clear. There is no significant pleural or pericardial effusion. A small hiatal hernia is noted.  The liver, gallbladder, biliary system, pancreas, spleen, adrenal glands and kidneys are stable without significant findings.  Limited enteric contrast was administered, confined to the stomach. Extensive central mesenteric inflammatory changes have progressed compared with the prior examination. There is diffuse wall thickening of the mid to distal small bowel associated with progressive proximal small bowel distension. Although not definitive due to the limited enteric contrast, there is suspicion of an ill-defined central mesenteric extraluminal fluid collection, measuring approximately 4.6 x 4.0 cm on image 54. No other extraluminal fluid collections are identified. There are multiple prominent mesenteric lymph nodes, likely reactive. There is mild proximal colonic wall thickening. A moderate amount of pelvic ascites has increased in volume, and there is a small amount of perihepatic ascites. There is no free intraperitoneal air.  There are no significant vascular findings. The uterus, ovaries and bladder appear unremarkable.  There are no acute osseous findings. There are probable changes of bilateral sacroiliitis.  IMPRESSION: 1. Progressive central mesenteric inflammatory changes with diffuse bowel wall thickening and mild proximal bowel distension. There is suspicion of an ill-defined mesenteric extraluminal fluid collection concerning for interloop abscess. No high-grade bowel obstruction or free intraperitoneal air . 2. Reactive  mesenteric lymphadenopathy with increased ascites. 3. Stable appearance of the solid parenchymal organs.   Electronically Signed   By: Roxy Horseman M.D.   On: 04/27/2014 07:24   Medications: I have reviewed the patient's current medications.  Assessment/Plan: 1) Crohn's disease with  ?abscess/malnutrition-she seems to be doing well today and has been tolerating her clear liquid diet well. Will advance her diet as tolerated. Continue present care. She will need to be on Azathiprine if she cannot have access to biologics so we need to make sure she some way to get her medication. 2) Malnutrition.   LOS: 1 day   MANN,JYOTHI 04/28/2014, 6:03 PM

## 2014-04-28 NOTE — Progress Notes (Signed)
FMTS Attending Daily Note:  Renold DonJeff Walden MD  [phone] pager  Family Practice pager:  [phone] I have seen and examined this patient and have reviewed their chart. I have discussed this patient with the resident. I agree with the resident's findings, assessment and care plan.  Additionally:  Slowly improving.  Still with some tenderness LLQ. Appreciate surgery and GI rec's.  Starting clear liquids on my exam.   Decrease IVF.    Tobey GrimJeffrey H Walden, MD 04/28/2014 3:09 PM

## 2014-04-28 NOTE — Progress Notes (Signed)
Subjective: She actually feels better, she is hungry now.  Abdomen is much less tender.  She has had BM and flatus  Objective: Vital signs in last 24 hours: Temp:  [98.4 F (36.9 C)-99.2 F (37.3 C)] 98.4 F (36.9 C) (06/17 0526) Pulse Rate:  [70-93] 70 (06/17 0526) Resp:  [16-18] 18 (06/17 0526) BP: (103-114)/(67-77) 103/67 mmHg (06/17 0526) SpO2:  [95 %-100 %] 100 % (06/17 0526) Weight:  [112.22 kg (247 lb 6.4 oz)-113.218 kg (249 lb 9.6 oz)] 112.22 kg (247 lb 6.4 oz) (06/17 0605) Last BM Date: 04/27/14 NPO,  nothing recorded on I/O Afebrile, VSS yesterday K+ down some WBC improving,  Intake/Output from previous day:   Intake/Output this shift:    General appearance: alert, cooperative and no distress GI: soft, non-tender; bowel sounds normal; no masses,  no organomegaly  Lab Results:   Recent Labs  04/27/14 1001 04/28/14 0740  WBC 14.6* 12.1*  HGB 10.0* 9.3*  HCT 31.6* 30.0*  PLT 463* 463*    BMET  Recent Labs  04/27/14 0210 04/27/14 1001 04/28/14 0740  NA 137  --  140  K 3.8  --  3.6*  CL 96  --  102  CO2 24  --  23  GLUCOSE 101*  --  85  BUN 8  --  9  CREATININE 0.77 0.70 0.59  CALCIUM 9.4  --  8.8   PT/INR No results found for this basename: LABPROT, INR,  in the last 72 hours   Recent Labs Lab 04/27/14 0210 04/28/14 0740  AST 10 8  ALT 7 6  ALKPHOS 63 51  BILITOT 0.3 0.2*  PROT 8.4* 7.3  ALBUMIN 3.0* 2.4*     Lipase     Component Value Date/Time   LIPASE 24 04/27/2014 0210     Studies/Results: Ct Abdomen Pelvis W Contrast  04/27/2014   CLINICAL DATA:  Abdominal pain with vomiting and diarrhea. History of Crohn's disease.  EXAM: CT ABDOMEN AND PELVIS WITH CONTRAST  TECHNIQUE: Multidetector CT imaging of the abdomen and pelvis was performed using the standard protocol following bolus administration of intravenous contrast.  CONTRAST:  100mL OMNIPAQUE IOHEXOL 300 MG/ML  SOLN  COMPARISON:  Prior examinations 11/15/2013 and  11/10/2013.  FINDINGS: The lung bases are clear. There is no significant pleural or pericardial effusion. A small hiatal hernia is noted.  The liver, gallbladder, biliary system, pancreas, spleen, adrenal glands and kidneys are stable without significant findings.  Limited enteric contrast was administered, confined to the stomach. Extensive central mesenteric inflammatory changes have progressed compared with the prior examination. There is diffuse wall thickening of the mid to distal small bowel associated with progressive proximal small bowel distension. Although not definitive due to the limited enteric contrast, there is suspicion of an ill-defined central mesenteric extraluminal fluid collection, measuring approximately 4.6 x 4.0 cm on image 54. No other extraluminal fluid collections are identified. There are multiple prominent mesenteric lymph nodes, likely reactive. There is mild proximal colonic wall thickening. A moderate amount of pelvic ascites has increased in volume, and there is a small amount of perihepatic ascites. There is no free intraperitoneal air.  There are no significant vascular findings. The uterus, ovaries and bladder appear unremarkable.  There are no acute osseous findings. There are probable changes of bilateral sacroiliitis.  IMPRESSION: 1. Progressive central mesenteric inflammatory changes with diffuse bowel wall thickening and mild proximal bowel distension. There is suspicion of an ill-defined mesenteric extraluminal fluid collection concerning for interloop abscess. No  high-grade bowel obstruction or free intraperitoneal air . 2. Reactive mesenteric lymphadenopathy with increased ascites. 3. Stable appearance of the solid parenchymal organs.   Electronically Signed   By: Roxy Horseman M.D.   On: 04/27/2014 07:24    Medications: . heparin  5,000 Units Subcutaneous 3 times per day  . methylPREDNISolone (SOLU-MEDROL) injection  60 mg Intravenous Daily  . ondansetron  4 mg Oral  4 times per day   Or  . ondansetron (ZOFRAN) IV  4 mg Intravenous 4 times per day  . pantoprazole (PROTONIX) IV  40 mg Intravenous Q24H  . piperacillin-tazobactam (ZOSYN)  IV  3.375 g Intravenous Q8H    Assessment/Plan Presumed crohn's disease with flair small bowel inflammation, possible abscess  Reactive lymphadenopathy   She appears to be better.  Will defer to Dr. Elnoria Howard on starting some clears, but I think she is ready for some.   LOS: 1 day    JENNINGS,WILLARD 04/28/2014

## 2014-04-28 NOTE — Progress Notes (Signed)
Alert, nad Reports less pain. +flatus/bm  Soft, obese, mild TTP just to left of umbilicus; no RT/guarding   Cont iv abx for crohns and abscess Can have sips of clears  crohns management per Dr Elnoria Howard Ambulate  Mary Sella. Andrey Campanile, MD, FACS General, Bariatric, & Minimally Invasive Surgery [facility], [place]

## 2014-04-28 NOTE — Progress Notes (Signed)
Family Medicine Teaching Service Daily Progress Note Intern Pager: [phone]  Patient name: Gina Gonzalez Medical record number: [id number] Date of birth: May 02, 1981 Age: 33 y.o. Gender: female  Primary Care Provider: Kevin Fenton, MD Consultants: GI, Surgery Code Status: Full code  Pt Overview and Major Events to Date:  6/16: patient admitted  Assessment and Plan: Gina Gonzalez is a 33 y.o. female presenting with abdominal pain, nausea, vomiting. Found to have possible abscess on CT scan after failing outpatient management of enteritis/Chrohn's flare. PMH is significant for Chrohn's disease.   # Enteritis and possible abscess: She failed treatment as an outpatient with cipro and flagyl. CT now concerning for abscess and worsening enteritis from 11/2013. White count elevated.  - NPO; follow-up GI for advancement of diet as patient is ready to start eating - 1/2NS + 20KCl at 125cc/hr  - zosyn per pharmacy  - IV solumedrol 60mg  daily while NPO  - scheduled zofran  - phenergan prn  - morphine 2mg  q2hr prn for pain control  - monitor fever curve and white count  # Chrohn's disease: She follows with Dr. Elnoria Howard, although she has not seen him for quite some time. Reports colonoscopy to confirm diagnosis earlier this year. On prednisone 5mg  daily.  - continue IV solumedrol - GI recommending to start azithioprine. Will follow-up recommendations today   # Anemia: Stable, at baseline of 10.5.  - continue to monitor   FEN/GI: NPO, 1/2NS +KCl at 125cc/hr  Prophylaxis: SQ heparin, protonix  Disposition: discharge home pending clinical improvement and recs from GI and surgery  Subjective:  Patient feeling much better. Pain is now a discomfort. Hungry today. Nausea subsided with zofran. No vomiting. No change in stools. +flatus.  Objective: Temp:  [98.4 F (36.9 C)-98.5 F (36.9 C)] 98.4 F (36.9 C) (06/17 0526) Pulse Rate:  [70-83] 70 (06/17 0526) Resp:  [18] 18 (06/17 0526) BP:  (103-114)/(67-77) 103/67 mmHg (06/17 0526) SpO2:  [95 %-100 %] 100 % (06/17 0526) Weight:  [247 lb 6.4 oz (112.22 kg)] 247 lb 6.4 oz (112.22 kg) (06/17 0347)  Physical Exam: General: Sitting up in bed in no acute distress Cardiovascular: Regular rate and rhythm, no murmur Respiratory: Clear to auscultation bilaterally, no wheeze Abdomen: Soft, mild LRQ, LLQ and epigastric tenderness, no rebound, non-distended Extremities: no calf tenderness  Laboratory:  Recent Labs Lab 04/27/14 0210 04/27/14 1001 04/28/14 0740  WBC 16.3* 14.6* 12.1*  HGB 10.5* 10.0* 9.3*  HCT 32.2* 31.6* 30.0*  PLT 572* 463* 463*    Recent Labs Lab 04/27/14 0210 04/27/14 1001 04/28/14 0740  NA 137  --  140  K 3.8  --  3.6*  CL 96  --  102  CO2 24  --  23  BUN 8  --  9  CREATININE 0.77 0.70 0.59  CALCIUM 9.4  --  8.8  PROT 8.4*  --  7.3  BILITOT 0.3  --  0.2*  ALKPHOS 63  --  51  ALT 7  --  6  AST 10  --  8  GLUCOSE 101*  --  85    Imaging/Diagnostic Tests: Ct Abdomen Pelvis W Contrast  04/27/2014   CLINICAL DATA:  Abdominal pain with vomiting and diarrhea. History of Crohn's disease.  EXAM: CT ABDOMEN AND PELVIS WITH CONTRAST  TECHNIQUE: Multidetector CT imaging of the abdomen and pelvis was performed using the standard protocol following bolus administration of intravenous contrast.  CONTRAST:  OMNIPAQUE IOHEXOL 300 MG/ML  SOLN  COMPARISON:  Prior  examinations 11/15/2013 and 11/10/2013.  FINDINGS: The lung bases are clear. There is no significant pleural or pericardial effusion. A small hiatal hernia is noted.  The liver, gallbladder, biliary system, pancreas, spleen, adrenal glands and kidneys are stable without significant findings.  Limited enteric contrast was administered, confined to the stomach. Extensive central mesenteric inflammatory changes have progressed compared with the prior examination. There is diffuse wall thickening of the mid to distal small bowel associated with progressive  proximal small bowel distension. Although not definitive due to the limited enteric contrast, there is suspicion of an ill-defined central mesenteric extraluminal fluid collection, measuring approximately 4.6 x 4.0 cm on image 54. No other extraluminal fluid collections are identified. There are multiple prominent mesenteric lymph nodes, likely reactive. There is mild proximal colonic wall thickening. A moderate amount of pelvic ascites has increased in volume, and there is a small amount of perihepatic ascites. There is no free intraperitoneal air.  There are no significant vascular findings. The uterus, ovaries and bladder appear unremarkable.  There are no acute osseous findings. There are probable changes of bilateral sacroiliitis.  IMPRESSION: 1. Progressive central mesenteric inflammatory changes with diffuse bowel wall thickening and mild proximal bowel distension. There is suspicion of an ill-defined mesenteric extraluminal fluid collection concerning for interloop abscess. No high-grade bowel obstruction or free intraperitoneal air . 2. Reactive mesenteric lymphadenopathy with increased ascites. 3. Stable appearance of the solid parenchymal organs.   Electronically Signed   By: Roxy HorsemanBill  Veazey M.D.   On: 04/27/2014 07:24    Jacquelin Hawkingalph Yecheskel Kurek, MD 04/28/2014, 9:44 AM PGY-1, [place] [facility] FPTS Intern pager: [phone], text pages welcome

## 2014-04-29 DIAGNOSIS — R599 Enlarged lymph nodes, unspecified: Secondary | ICD-10-CM

## 2014-04-29 DIAGNOSIS — D72829 Elevated white blood cell count, unspecified: Secondary | ICD-10-CM

## 2014-04-29 MED ORDER — PREDNISONE 20 MG PO TABS
40.0000 mg | ORAL_TABLET | Freq: Every day | ORAL | Status: DC
  Administered 2014-04-29 – 2014-04-30 (×2): 40 mg via ORAL
  Filled 2014-04-29 (×3): qty 2

## 2014-04-29 NOTE — Progress Notes (Signed)
FMTS Attending Daily Note:  Jeff Walden MD  [phone] pager  Family Practice pager:  [phone] I have discussed this patient with the resident Dr. Nettey.  I agree with their findings, assessment, and care plan  

## 2014-04-29 NOTE — Progress Notes (Signed)
Family Medicine Teaching Service Daily Progress Note Intern Pager: [phone]  Patient name: Gina Gonzalez Medical record number: [id number] Date of birth: 10-28-1981 Age: 33 y.o. Gender: female  Primary Care Provider: Kevin Fenton, MD Consultants: GI, Surgery Code Status: Full code  Pt Overview and Major Events to Date:  6/16: patient admitted 6/18: transitioned to PO steroids; full liquid diet  Assessment and Plan: Gina Gonzalez is a 33 y.o. female presenting with abdominal pain, nausea, vomiting. Found to have possible abscess on CT scan after failing outpatient management of enteritis/Chrohn's flare. PMH is significant for Chrohn's disease.   # Enteritis and possible abscess: She failed treatment as an outpatient with cipro and flagyl. CT now concerning for abscess and worsening enteritis from 11/2013. White count elevated.  - Full liquid diet per GI - KVO - zosyn per pharmacy  - prednisone 40mg  daily - zofran PRN - phenergan prn  - morphine 2mg  q2hr prn for pain control  - monitor fever curve and white count  # Chrohn's disease: She follows with Dr. Elnoria Gonzalez, although she has not seen him for quite some time. Reports colonoscopy to confirm diagnosis earlier this year. On prednisone 5mg  daily.  - continue po prednisone - GI recommending to start azithioprine.  # Anemia: Stable, at baseline of 10.5. Last hgb of 9.3 - continue to monitor   FEN/GI: Full liquid, KVO Prophylaxis: SQ heparin, protonix  Disposition: discharge home pending clinical improvement and recs from GI  Subjective:  Patient continues to feel better. Still having some abdominal discomfort. No nausea or vomiting. Had a bowel movement last night.  Objective: Temp:  [97.8 F (36.6 C)-98.3 F (36.8 C)] 97.8 F (36.6 C) (06/18 0541) Pulse Rate:  [53-75] 61 (06/18 0541) Resp:  [18-20] 18 (06/18 0541) BP: (96-110)/(67-70) 96/67 mmHg (06/18 0541) SpO2:  [98 %-99 %] 99 % (06/18 0541) Weight:  [242 lb  (109.77 kg)] 242 lb (109.77 kg) (06/18 0600)  Physical Exam: General: Sitting up in bed in no acute distress Cardiovascular: Regular rate and rhythm, no murmur Respiratory: Clear to auscultation bilaterally, no wheeze Abdomen: Soft, mild LRQ, LLQ and epigastric tenderness, no rebound, non-distended Extremities: no calf tenderness  Laboratory:  Recent Labs Lab 04/27/14 0210 04/27/14 1001 04/28/14 0740  WBC 16.3* 14.6* 12.1*  HGB 10.5* 10.0* 9.3*  HCT 32.2* 31.6* 30.0*  PLT 572* 463* 463*    Recent Labs Lab 04/27/14 0210 04/27/14 1001 04/28/14 0740  NA 137  --  140  K 3.8  --  3.6*  CL 96  --  102  CO2 24  --  23  BUN 8  --  9  CREATININE 0.77 0.70 0.59  CALCIUM 9.4  --  8.8  PROT 8.4*  --  7.3  BILITOT 0.3  --  0.2*  ALKPHOS 63  --  51  ALT 7  --  6  AST 10  --  8  GLUCOSE 101*  --  85    Imaging/Diagnostic Tests: No results found.  Jacquelin Hawking, MD 04/29/2014, 10:03 AM PGY-1, [facility] Family Medicine FPTS Intern pager: [phone], text pages welcome

## 2014-04-29 NOTE — Progress Notes (Signed)
Patient seen and examined this afternoon. States that she feels a lot better. Reports minimal abdominal discomfort. States that she tolerated the thicker liquid diet. She denies any nausea or vomiting  Alert, no apparent distress, nontoxic Abdomen-soft, nondistended, mild tenderness to left of umbilicus-significantly improved from initial hospital day  Crohn's disease flare Possible interloop small bowel abscess  Recommend advance diet as tolerated. She appears to be improving. No fever. White blood cell count decreasing. Appears to be tolerating diet.  Would recommend patient get a total of 10-14 days of antibiotics for potential interloop small bowel abscess-unless GI disagrees  No role for surgery at this time  We will sign off. Please call with questions   Mary Sellaric M. Andrey CampanileWilson, MD, FACS General, Bariatric, & Minimally Invasive Surgery [facility] [place]

## 2014-04-29 NOTE — Progress Notes (Signed)
[facility] Progress Note     Subjective: Pt feels much better.  Was nervous about starting solid diet per Dr. Rolla Etienne recommendations, thus she was started on fulls.  She's tolerating them so far this am.  No N/V.  Feels a bit bloated/gassy and still tender, but a lot better than when she came in.  She's having BM's and flatus.    Objective: Vital signs in last 24 hours: Temp:  [97.8 F (36.6 C)-98.3 F (36.8 C)] 97.8 F (36.6 C) (06/18 0541) Pulse Rate:  [53-75] 61 (06/18 0541) Resp:  [18-20] 18 (06/18 0541) BP: (96-110)/(67-70) 96/67 mmHg (06/18 0541) SpO2:  [98 %-99 %] 99 % (06/18 0541) Weight:  [242 lb (109.77 kg)] 242 lb (109.77 kg) (06/18 0600) Last BM Date: 04/28/14  Intake/Output from previous day: 06/17 0701 - 06/18 0700 In: 1537.8 [P.O.:642; I.V.:845.8; IV Piggyback:50] Out: -  Intake/Output this shift:    PE: Gen:  Alert, NAD, pleasant Abd: Obese, soft, ND, mild tenderness >LLQ, +BS, no HSM   Lab Results:   Recent Labs  04/27/14 1001 04/28/14 0740  WBC 14.6* 12.1*  HGB 10.0* 9.3*  HCT 31.6* 30.0*  PLT 463* 463*   BMET  Recent Labs  04/27/14 0210 04/27/14 1001 04/28/14 0740  NA 137  --  140  K 3.8  --  3.6*  CL 96  --  102  CO2 24  --  23  GLUCOSE 101*  --  85  BUN 8  --  9  CREATININE 0.77 0.70 0.59  CALCIUM 9.4  --  8.8   PT/INR No results found for this basename: LABPROT, INR,  in the last 72 hours CMP     Component Value Date/Time   NA 140 04/28/2014 0740   K 3.6* 04/28/2014 0740   CL 102 04/28/2014 0740   CO2 23 04/28/2014 0740   GLUCOSE 85 04/28/2014 0740   BUN 9 04/28/2014 0740   CREATININE 0.59 04/28/2014 0740   CALCIUM 8.8 04/28/2014 0740   PROT 7.3 04/28/2014 0740   ALBUMIN 2.4* 04/28/2014 0740   AST 8 04/28/2014 0740   ALT 6 04/28/2014 0740   ALKPHOS 51 04/28/2014 0740   BILITOT 0.2* 04/28/2014 0740   GFRNONAA >90 04/28/2014 0740   GFRAA >90 04/28/2014 0740   Lipase     Component Value Date/Time   LIPASE 24 04/27/2014  0210       Studies/Results: No results found.  Anti-infectives: Anti-infectives   Start     Dose/Rate Route Frequency Ordered Stop   04/27/14 1500  piperacillin-tazobactam (ZOSYN) IVPB 3.375 g     3.375 g 12.5 mL/hr over 240 Minutes Intravenous Every 8 hours 04/27/14 1005     04/27/14 0815  piperacillin-tazobactam (ZOSYN) IVPB 3.375 g     3.375 g 100 mL/hr over 30 Minutes Intravenous  Once 04/27/14 0811 04/27/14 0853       Assessment/Plan Presumed crohn's disease with flair  Small bowel inflammation, possible abscess  Reactive lymphadenopathy  Leukocytosis - 12.1 - improved  Plan: She appears to be better.  Still some bloating and tenderness.  Does not appear to need surgery while here unless she worsens unexpectedly.  Continue conservative medical management. Started on full liquid diet this am Appreciate GI's recommendations     LOS: 2 days    DORT, MEGAN 04/29/2014, 8:03 AM Pager: [phone]

## 2014-04-29 NOTE — Progress Notes (Signed)
Subjective: Feeling better, but she reports feeling bloated.  Some lower abdominal pain.  Objective: Vital signs in last 24 hours: Temp:  [97.8 F (36.6 C)-98.3 F (36.8 C)] 97.8 F (36.6 C) (06/18 0541) Pulse Rate:  [53-75] 61 (06/18 0541) Resp:  [18-20] 18 (06/18 0541) BP: (96-110)/(67-70) 96/67 mmHg (06/18 0541) SpO2:  [98 %-99 %] 99 % (06/18 0541) Weight:  [242 lb (109.77 kg)] 242 lb (109.77 kg) (06/18 0600) Last BM Date: 04/28/14  Intake/Output from previous day: 06/17 0701 - 06/18 0700 In: 1537.8 [P.O.:642; I.V.:845.8; IV Piggyback:50] Out: -  Intake/Output this shift:    General appearance: alert and no distress GI: left lower abdominal pain  Lab Results:  Recent Labs  04/27/14 0210 04/27/14 1001 04/28/14 0740  WBC 16.3* 14.6* 12.1*  HGB 10.5* 10.0* 9.3*  HCT 32.2* 31.6* 30.0*  PLT 572* 463* 463*   BMET  Recent Labs  04/27/14 0210 04/27/14 1001 04/28/14 0740  NA 137  --  140  K 3.8  --  3.6*  CL 96  --  102  CO2 24  --  23  GLUCOSE 101*  --  85  BUN 8  --  9  CREATININE 0.77 0.70 0.59  CALCIUM 9.4  --  8.8   LFT  Recent Labs  04/28/14 0740  PROT 7.3  ALBUMIN 2.4*  AST 8  ALT 6  ALKPHOS 51  BILITOT 0.2*   PT/INR No results found for this basename: LABPROT, INR,  in the last 72 hours Hepatitis Panel No results found for this basename: HEPBSAG, HCVAB, HEPAIGM, HEPBIGM,  in the last 72 hours C-Diff No results found for this basename: CDIFFTOX,  in the last 72 hours Fecal Lactopherrin No results found for this basename: FECLLACTOFRN,  in the last 72 hours  Studies/Results: No results found.  Medications:  Scheduled: . heparin  5,000 Units Subcutaneous 3 times per day  . methylPREDNISolone (SOLU-MEDROL) injection  60 mg Intravenous Daily  . pantoprazole (PROTONIX) IV  40 mg Intravenous Q24H  . piperacillin-tazobactam (ZOSYN)  IV  3.375 g Intravenous Q8H   Continuous: . 0.45 % NaCl with KCl 20 mEq / L 50 mL/hr at 04/29/14 0417     Assessment/Plan: 1) Crohn's disease. 2) ABM abscess.   She is better clinically.  I think she can be advanced to oral steroids.  She is going to try and obtain Medicaid/disability.  Upon D/C, her PCP can call me to help manage her Crohn's.  This is to help minimize out-of-pocket costs for her until she can obtain insurance.  Plan: 1) Change to prednisone 40 mg QD. 2) Continue with antibiotics. 3) Advance diet to full liquids.  If she can tolerate liquids today her diet can be advanced to a regular diet.  LOS: 2 days   HUNG,PATRICK D 04/29/2014, 7:36 AM

## 2014-04-30 DIAGNOSIS — K630 Abscess of intestine: Secondary | ICD-10-CM

## 2014-04-30 LAB — BASIC METABOLIC PANEL
BUN: 8 mg/dL (ref 6–23)
CALCIUM: 9.4 mg/dL (ref 8.4–10.5)
CO2: 24 mEq/L (ref 19–32)
Chloride: 103 mEq/L (ref 96–112)
Creatinine, Ser: 0.77 mg/dL (ref 0.50–1.10)
Glucose, Bld: 82 mg/dL (ref 70–99)
Potassium: 3.5 mEq/L — ABNORMAL LOW (ref 3.7–5.3)
SODIUM: 142 meq/L (ref 137–147)

## 2014-04-30 MED ORDER — CIPROFLOXACIN HCL 500 MG PO TABS: 500.0000 mg | ORAL_TABLET | Freq: Two times a day (BID) | ORAL | Status: AC

## 2014-04-30 MED ORDER — POTASSIUM CHLORIDE CRYS ER 20 MEQ PO TBCR
30.0000 meq | EXTENDED_RELEASE_TABLET | ORAL | Status: AC
  Administered 2014-04-30: 30 meq via ORAL
  Filled 2014-04-30 (×2): qty 1

## 2014-04-30 MED ORDER — PREDNISONE 10 MG PO TABS: 40.0000 mg | ORAL_TABLET | Freq: Every day | ORAL | Status: DC

## 2014-04-30 MED ORDER — AMOXICILLIN-POT CLAVULANATE 500-125 MG PO TABS: 1.0000 | ORAL_TABLET | Freq: Three times a day (TID) | ORAL | Status: AC

## 2014-04-30 NOTE — Discharge Instructions (Signed)

## 2014-04-30 NOTE — Progress Notes (Signed)
Subjective: Feeling well.  No complaints from the Crohn's standpoint.  Tolerated regular diet.  Objective: Vital signs in last 24 hours: Temp:  [98 F (36.7 C)-98.4 F (36.9 C)] 98 F (36.7 C) (06/19 0550) Pulse Rate:  [61-68] 61 (06/19 0550) Resp:  [18-20] 18 (06/19 0550) BP: (92-110)/(62-74) 92/62 mmHg (06/19 0550) SpO2:  [97 %-100 %] 99 % (06/19 0550) Weight:  [240 lb 3.2 oz (108.954 kg)] 240 lb 3.2 oz (108.954 kg) (06/19 0500) Last BM Date: 04/29/14  Intake/Output from previous day: 06/18 0701 - 06/19 0700 In: 300 [P.O.:300] Out: -  Intake/Output this shift:    General appearance: alert and no distress GI: soft, non-tender; bowel sounds normal; no masses,  no organomegaly  Lab Results:  Recent Labs  04/27/14 1001 04/28/14 0740  WBC 14.6* 12.1*  HGB 10.0* 9.3*  HCT 31.6* 30.0*  PLT 463* 463*   BMET  Recent Labs  04/27/14 1001 04/28/14 0740  NA  --  140  K  --  3.6*  CL  --  102  CO2  --  23  GLUCOSE  --  85  BUN  --  9  CREATININE 0.70 0.59  CALCIUM  --  8.8   LFT  Recent Labs  04/28/14 0740  PROT 7.3  ALBUMIN 2.4*  AST 8  ALT 6  ALKPHOS 51  BILITOT 0.2*   PT/INR No results found for this basename: LABPROT, INR,  in the last 72 hours Hepatitis Panel No results found for this basename: HEPBSAG, HCVAB, HEPAIGM, HEPBIGM,  in the last 72 hours C-Diff No results found for this basename: CDIFFTOX,  in the last 72 hours Fecal Lactopherrin No results found for this basename: FECLLACTOFRN,  in the last 72 hours  Studies/Results: No results found.  Medications:  Scheduled: . heparin  5,000 Units Subcutaneous 3 times per day  . piperacillin-tazobactam (ZOSYN)  IV  3.375 g Intravenous Q8H  . potassium chloride  30 mEq Oral Q4H  . predniSONE  40 mg Oral Q breakfast   Continuous:   Assessment/Plan: 1) Crohn's disease. 2) Abscess.   Clinically she is well.  From the GI standpoint she is well.  AM labs pending.  Plan:  1) Steroid taper  regimen:  Prednisone 40 mg x 7 days, 20 mg x 7 days, 15 mg x 7 days, 10 mg x 7 days, 5 mg x 7 days, and then discontinue. 2) I am willing to discuss the case with her PCP for further treatment and to minimize her out-of-pocket expenses. 3) Signing off.  LOS: 3 days   HUNG,PATRICK D 04/30/2014, 8:14 AM

## 2014-04-30 NOTE — Care Management Note (Signed)
    Page 1 of 1   04/30/2014     2:12:55 PM CARE MANAGEMENT NOTE 04/30/2014  Patient:  Gina Gonzalez,Gina Gonzalez   Account Number:  [id number]  Date Initiated:  04/30/2014  Documentation initiated by:  Letha Cape  Subjective/Objective Assessment:   dx crohns  admit- lives with family     Action/Plan:   Anticipated DC Date:  04/30/2014   Anticipated DC Plan:  HOME/SELF CARE      DC Planning Services  CM consult      Choice offered to / List presented to:             Status of service:  Completed, signed off Medicare Important Message given?   (If response is "NO", the following Medicare IM given date fields will be blank) Date Medicare IM given:   Date Additional Medicare IM given:    Discharge Disposition:  HOME/SELF CARE  Per UR Regulation:  Reviewed for med. necessity/level of care/duration of stay  If discussed at Long Length of Stay Meetings, dates discussed:    Comments:  04/30/14 1358 Letha Cape RN, BSN [phone] Patient is for dc today, she does not need med ast, amoxicillin is 58.39, cipro is affordable, potassium and prednisone is affordable.

## 2014-04-30 NOTE — Discharge Summary (Signed)
Family Medicine Teaching [facility] Discharge Summary  Patient name: Gina LevanChrystal Gonzalez Medical record number: [id number] Date of birth: 05/29/1981 Age: 33 y.o. Gender: female Date of Admission: 04/27/2014  Date of Discharge: 04/30/2014 Admitting Physician: Charm RingsErin J Honig, MD  Primary Care Provider: Kevin FentonBradshaw, Samuel, MD Consultants: GI, Surgery  Indication for Hospitalization: Colitis  Discharge Diagnoses/Problem List:  Enteritis with abscess Crohn's disease Anemia  Disposition: Discharge home  Discharge Condition: Stable  Discharge Exam: General: Sitting up in bed in no acute distress  Cardiovascular: Regular rate and rhythm, no murmur  Respiratory: Clear to auscultation bilaterally, no wheeze  Abdomen: Soft, mild LRQ, LLQ and epigastric tenderness, no rebound, non-distended  Extremities: no calf tenderness  Brief Hospital Course:   HPI: Gina Katrinka BlazingSmith is a 33 y.o. female with Chrohn's disease presenting with 2-3 weeks of abdominal pain, nausea and vomiting. She was seen in the clinic for this on 5/29 and given a 2 week course of cipro and flagyl. At that time her prednisone was also increased to 10mg . She did okay, but not great on the antibiotics. She completed the antibiotics and went back to 5mg  of prednisone on Friday. Over the last few days, she has had worsening abdominal pain, nausea and vomiting. She reports subjective fevers last week. She does have diarrhea, but states this at her baseline. There is no blood in the stool or emesis. Abdominal pain is diffuse. Her appetite has been decreased the last 2-3 days and she has had difficulty with fluids starting today. She also reports some mild back pain and some intermittent dizziness the last day or so.   Enteritis with abscess: CT abdomen obtained and showed possible abscess. Surgery called and consulted with IR and decided not to aspirate. Medical management with recommendations from GI. Patient was on Zosyn and was afebrile.  Initially on morphine for pain, which eventually improved. Diet advanced slowly and she eventually tolerated a regular diet. On hospital day #3, Zosyn switched to Cipro and Augmentin per pharmacy recommendations.  Crohn's disease: solumedrol initially while patient NPO, then switched to prednisone. Sent home on taper per GI recommendations  Anemia: hemoglobin remained stable. Patient asymptomatic  Issues for Follow Up:  1. Continued improvement on current antibiotic regimen 2. Prednisone taper 3. Call Dr. Elnoria HowardHung, GI, to coordinate management of patient's Crohn's. Plans to start Azathioprine  Significant Procedures: None  Significant Labs and Imaging:   Recent Labs Lab 04/27/14 0210 04/27/14 1001 04/28/14 0740  WBC 16.3* 14.6* 12.1*  HGB 10.5* 10.0* 9.3*  HCT 32.2* 31.6* 30.0*  PLT 572* 463* 463*    Recent Labs Lab 04/27/14 0210 04/27/14 1001 04/28/14 0740 04/30/14 0655  NA 137  --  140 142  K 3.8  --  3.6* 3.5*  CL 96  --  102 103  CO2 24  --  23 24  GLUCOSE 101*  --  85 82  BUN 8  --  9 8  CREATININE 0.77 0.70 0.59 0.77  CALCIUM 9.4  --  8.8 9.4  ALKPHOS 63  --  51  --   AST 10  --  8  --   ALT 7  --  6  --   ALBUMIN 3.0*  --  2.4*  --     Ct Abdomen Pelvis W Contrast  04/27/2014   CLINICAL DATA:  Abdominal pain with vomiting and diarrhea. History of Crohn's disease.  EXAM: CT ABDOMEN AND PELVIS WITH CONTRAST  TECHNIQUE: Multidetector CT imaging of the abdomen and pelvis was  performed using the standard protocol following bolus administration of intravenous contrast.  CONTRAST:  OMNIPAQUE IOHEXOL 300 MG/ML  SOLN  COMPARISON:  Prior examinations 11/15/2013 and 11/10/2013.  FINDINGS: The lung bases are clear. There is no significant pleural or pericardial effusion. A small hiatal hernia is noted.  The liver, gallbladder, biliary system, pancreas, spleen, adrenal glands and kidneys are stable without significant findings.  Limited enteric contrast was administered,  confined to the stomach. Extensive central mesenteric inflammatory changes have progressed compared with the prior examination. There is diffuse wall thickening of the mid to distal small bowel associated with progressive proximal small bowel distension. Although not definitive due to the limited enteric contrast, there is suspicion of an ill-defined central mesenteric extraluminal fluid collection, measuring approximately 4.6 x 4.0 cm on image 54. No other extraluminal fluid collections are identified. There are multiple prominent mesenteric lymph nodes, likely reactive. There is mild proximal colonic wall thickening. A moderate amount of pelvic ascites has increased in volume, and there is a small amount of perihepatic ascites. There is no free intraperitoneal air.  There are no significant vascular findings. The uterus, ovaries and bladder appear unremarkable.  There are no acute osseous findings. There are probable changes of bilateral sacroiliitis.  IMPRESSION: 1. Progressive central mesenteric inflammatory changes with diffuse bowel wall thickening and mild proximal bowel distension. There is suspicion of an ill-defined mesenteric extraluminal fluid collection concerning for interloop abscess. No high-grade bowel obstruction or free intraperitoneal air . 2. Reactive mesenteric lymphadenopathy with increased ascites. 3. Stable appearance of the solid parenchymal organs.   Electronically Signed   By: Roxy Horseman M.D.   On: 04/27/2014 07:24   Results/Tests Pending at Time of Discharge: None  Discharge Medications:    Medication List    STOP taking these medications       metroNIDAZOLE 500 MG tablet  Commonly known as:  FLAGYL      TAKE these medications       amoxicillin-clavulanate 500-125 MG per tablet  Commonly known as:  AUGMENTIN  Take 1 tablet (500 mg total) by mouth 3 (three) times daily.     ciprofloxacin 500 MG tablet  Commonly known as:  CIPRO  Take 1 tablet (500 mg total) by  mouth 2 (two) times daily.     HYDROcodone-acetaminophen 10-325 MG per tablet  Commonly known as:  NORCO  Take 0.5-1 tablets by mouth 4 (four) times daily as needed (pain).     ibuprofen 200 MG tablet  Commonly known as:  ADVIL,MOTRIN  Take 800-1,000 mg by mouth 2 (two) times daily as needed (daily). For pain     predniSONE 10 MG tablet  Commonly known as:  DELTASONE  Take 4 tablets (40 mg total) by mouth daily with breakfast. Take 40mg  x4 days; 20mg  x7 days; 15mg  x7 days; 10mg  x7days; 5mg  x7 days  Start taking on:  05/01/2014        Discharge Instructions: Please refer to Patient Instructions section of EMR for full details.  Patient was counseled important signs and symptoms that should prompt return to medical care, changes in medications, dietary instructions, activity restrictions, and follow up appointments.   Follow-Up Appointments: Follow-up Information   Follow up with Beverely Low, MD On 05/07/2014. (2:30PM, For hospital follow-up)    Specialty:  Family Medicine   Contact information:   [address] [place] [address] [phone]       Jacquelin Hawking, MD 04/30/2014, 11:56 AM PGY-1, [facility] Family Medicine

## 2014-04-30 NOTE — Progress Notes (Deleted)
Verbally understood DC instructions, no questions ask. 

## 2014-04-30 NOTE — Clinical Documentation Improvement (Signed)
Possible Clinical Conditions?   Hypokalemia                      Other Condition___________________                 Cannot Clinically Determine_________   Supporting Information:  Risk Factors:  Crohn's disease K+ normal level 3.7-5.3 6/16  = 3.8 3/17  = 3.6   Treatment:  Po K Dur q4h x 2 doses  Thank You, Harless Litten ,RN Clinical Documentation Specialist:  [phone]  [facility]- Health Information Management

## 2014-05-05 NOTE — Discharge Summary (Signed)
Family Medicine Teaching Service  Discharge Note : Attending Jeff Walden MD Pager [phone] Inpatient Team Pager:  [phone]  I have reviewed this patient and the patient's chart and have discussed discharge planning with the resident at the time of discharge. I agree with the discharge plan as above.    

## 2014-05-07 ENCOUNTER — Inpatient Hospital Stay: Payer: Self-pay | Admitting: Family Medicine

## 2014-05-31 ENCOUNTER — Telehealth: Payer: Self-pay | Admitting: Family Medicine

## 2014-05-31 NOTE — Telephone Encounter (Signed)
Called patient and informed her that she missed her HFU and would need an appointment before theses medicines could be refilled. Patient will call back next week to schedule an appointment.

## 2014-05-31 NOTE — Telephone Encounter (Signed)
Pt called and would like a refill on her Hydrocodone and Prednisone left up front for pickup. jw

## 2014-06-22 ENCOUNTER — Ambulatory Visit (INDEPENDENT_AMBULATORY_CARE_PROVIDER_SITE_OTHER): Payer: Self-pay | Admitting: Family Medicine

## 2014-06-22 ENCOUNTER — Encounter: Payer: Self-pay | Admitting: Family Medicine

## 2014-06-22 VITALS — BP 133/99 | HR 84 | Temp 98.9°F | Ht 67.0 in | Wt 244.0 lb

## 2014-06-22 DIAGNOSIS — K50018 Crohn's disease of small intestine with other complication: Secondary | ICD-10-CM

## 2014-06-22 DIAGNOSIS — K5000 Crohn's disease of small intestine without complications: Secondary | ICD-10-CM

## 2014-06-22 MED ORDER — PREDNISONE 10 MG PO TABS: 40.0000 mg | ORAL_TABLET | Freq: Every day | ORAL | Status: DC

## 2014-06-22 MED ORDER — CLINDAMYCIN HCL 300 MG PO CAPS: 300.0000 mg | ORAL_CAPSULE | Freq: Three times a day (TID) | ORAL | Status: DC

## 2014-06-22 MED ORDER — HYDROCODONE-ACETAMINOPHEN 5-325 MG PO TABS: 1.0000 | ORAL_TABLET | Freq: Four times a day (QID) | ORAL | Status: DC | PRN

## 2014-06-22 MED ORDER — HYDROCODONE-ACETAMINOPHEN 10-325 MG PO TABS: 1.0000 | ORAL_TABLET | Freq: Three times a day (TID) | ORAL | Status: DC | PRN

## 2014-06-22 NOTE — Patient Instructions (Addendum)
Great to see you!  Meet with Abundio Miu as soon as possible for orange card  Crohn Disease Crohn disease is a long-term (chronic) soreness and redness (inflammation) of the intestines (bowel). It can affect any portion of the digestive tract, from the mouth to the anus. It can also cause problems outside the digestive tract. Crohn disease is closely related to a disease called ulcerative colitis (together, these two diseases are called inflammatory bowel disease).  CAUSES  The cause of Crohn disease is not known. One Nelva Bush is that, in an easily affected person, the immune system is triggered to attack the body's own digestive tissue. Crohn disease runs in families. It seems to be more common in certain geographic areas and amongst certain races. There are no clear-cut dietary causes.  SYMPTOMS  Crohn disease can cause many different symptoms since it can affect many different parts of the body. Symptoms include:  Fatigue.  Weight loss.  Chronic diarrhea, sometime bloody.  Abdominal pain and cramps.  Fever.  Ulcers or canker sores in the mouth or rectum.  Anemia (low red blood cells).  Arthritis, skin problems, and eye problems may occur. Complications of Crohn disease can include:  Series of holes (perforation) of the bowel.  Portions of the intestines sticking to each other (adhesions).  Obstruction of the bowel.  Fistula formation, typically in the rectal area but also in other areas. A fistula is an opening between the bowels and the outside, or between the bowels and another organ.  A painful crack in the mucous membrane of the anus (rectal fissure). DIAGNOSIS  Your caregiver may suspect Crohn disease based on your symptoms and an exam. Blood tests may confirm that there is a problem. You may be asked to submit a stool specimen for examination. X-rays and CT scans may be necessary. Ultimately, the diagnosis is usually made after a procedure that uses a flexible tube  that is inserted via your mouth or your anus. This is done under sedation and is called either an upper endoscopy or colonoscopy. With these tests, the specialist can take tiny tissue samples and remove them from the inside of the bowel (biopsy). Examination of this biopsy tissue under a microscope can reveal Crohn disease as the cause of your symptoms. Due to the many different forms that Crohn disease can take, symptoms may be present for several years before a diagnosis is made. TREATMENT  Medications are often used to decrease inflammation and control the immune system. These include medicines related to aspirin, steroid medications, and newer and stronger medications to slow down the immune system. Some medications may be used as suppositories or enemas. A number of other medications are used or have been studied. Your caregiver will make specific recommendations. HOME CARE INSTRUCTIONS   Symptoms such as diarrhea can be controlled with medications. Avoid foods that have a laxative effect such as fresh fruit, vegetables, and dairy products. During flare-ups, you can rest your bowel by refraining from solid foods. Drink clear liquids frequently during the day. (Electrolyte or rehydrating fluids are best. Your caregiver can help you with suggestions.) Drink often to prevent loss of body fluids (dehydration). When diarrhea has cleared, eat small meals and more frequently. Avoid food additives and stimulants such as caffeine (coffee, tea, or chocolate). Enzyme supplements may help if you develop intolerance to a sugar in dairy products (lactose). Ask your caregiver or dietitian about specific dietary instructions.  Try to maintain a positive attitude. Learn relaxation techniques such as self-hypnosis,  mental imaging, and muscle relaxation.  If possible, avoid stresses which can aggravate your condition.  Exercise regularly.  Follow your diet.  Always get plenty of rest. SEEK MEDICAL CARE IF:    Your symptoms fail to improve after a week or two of new treatment.  You experience continued weight loss.  You have ongoing cramps or loose bowels.  You develop a new skin rash, skin sores, or eye problems. SEEK IMMEDIATE MEDICAL CARE IF:   You have worsening of your symptoms or develop new symptoms.  You have a fever.  You develop bloody diarrhea.  You develop severe abdominal pain. MAKE SURE YOU:   Understand these instructions.  Will watch your condition.  Will get help right away if you are not doing well or get worse. Document Released: 08/08/2005 Document Revised: 03/15/2014 Document Reviewed: 07/07/2007 [facility] Patient Information 2015 [employer] [place]. This information is not intended to replace advice given to you by your health care provider. Make sure you discuss any questions you have with your health care provider.

## 2014-06-22 NOTE — Assessment & Plan Note (Signed)
With current flare, no obvious obstruction Previous abcess and currently with chills so will cover with antibiotics to try to prevent an admission Steroid 40 mg and tapering to 5 over 4 weeks.  Using  A lot of NSAIDs wil Rx small amount of norco to try to decrease Follow up with me in 7-10 days if not improving F/u  1 month, recommended meeting with Abundio Miu to discuss orange card. Discussed red flags that should

## 2014-06-22 NOTE — Progress Notes (Signed)
Patient ID: Gina Gonzalez, female   DOB: 1981/03/23, 33 y.o.   MRN: [id number]  Kevin Fenton, MD Phone: [phone]  Subjective:  Chief complaint-noted  Pt Here for abd pain  She has a Hx of crohn's disease with an admission in June of this year at which time she had an interloop abscess treated with IV antibiotics.   She states that she has had slowly progressive abd pain for about 3 weeks. She previously had 40 mg of prednisone and noticed that the pain returned with decrease to 15 mg. She has now been out for about 3 days.   She had pain as severe as 8/10 this am and states it is a 4-5/10 currently. She describes it as Right sided upper and lower abd pain without radiation. She has had 4-5 loose stools daily since her Dx which has not changed and she is tolerating PO food and fluid. She had a single episode of emesis this am described as NBNB.   She is still passing gas.   She denies overt fevers but has had chills and night sweats recently.   She doesn't see GI currently due to financial constraints. She says she has been denied from [facility] and an appeal. She previously saw Dr. Elnoria Howard who wanted to start her on humira.   ROS-  Per HPI  Past Medical History Patient Active Problem List   Diagnosis Date Noted  . Crohn's colitis 04/27/2014  . Annual physical exam 02/17/2014  . Crohn's disease of ileum 10/23/2013  . Anemia 08/15/2012    Medications- reviewed and updated Current Outpatient Prescriptions  Medication Sig Dispense Refill  . clindamycin (CLEOCIN) 300 MG capsule Take 1 capsule (300 mg total) by mouth 3 (three) times daily.  30 capsule  0  . HYDROcodone-acetaminophen (NORCO) 5-325 MG per tablet Take 1 tablet by mouth every 6 (six) hours as needed for moderate pain.  20 tablet  0  . ibuprofen (ADVIL,MOTRIN) 200 MG tablet Take 800-1,000 mg by mouth 2 (two) times daily as needed (daily). For pain      . predniSONE (DELTASONE) 10 MG tablet Take 4 tablets (40 mg total)  by mouth daily with breakfast. Take 40mg  x4 days; 20mg  x7 days; 15mg  x7 days; 10mg  x7days; 5mg  x7 days  55 tablet  0   No current facility-administered medications for this visit.    Objective: BP 133/99  Pulse 84  Temp(Src) 98.9 F (37.2 C) (Oral)  Ht 5\' 7"  (1.702 m)  Wt 244 lb (110.678 kg)  BMI 38.21 kg/m2 Gen: NAD, alert, cooperative with exam HEENT: NCAT CV: RRR, good S1/S2, no murmur Resp: CTABL, no wheezes, non-labored Abd: SNTND, BS present, no guarding or organomegaly Ext: No edema, warm Neuro: Alert and oriented, No gross deficits   Assessment/Plan:  Crohn's disease of ileum With current flare, no obvious obstruction Previous abcess and currently with chills so will cover with antibiotics to try to prevent an admission Steroid 40 mg and tapering to 5 over 4 weeks.  Using  A lot of NSAIDs wil Rx small amount of norco to try to decrease Follow up with me in 7-10 days if not improving F/u  1 month, recommended meeting with Abundio Miu to discuss orange card. Discussed red flags that should       Meds ordered this encounter  Medications  . DISCONTD: HYDROcodone-acetaminophen (NORCO) 10-325 MG per tablet    Sig: Take 1 tablet by mouth every 8 (eight) hours as needed.    Dispense:  20 tablet    Refill:  0  . predniSONE (DELTASONE) 10 MG tablet    Sig: Take 4 tablets (40 mg total) by mouth daily with breakfast. Take 40mg  x4 days; 20mg  x7 days; 15mg  x7 days; 10mg  x7days; 5mg  x7 days    Dispense:  55 tablet    Refill:  0  . clindamycin (CLEOCIN) 300 MG capsule    Sig: Take 1 capsule (300 mg total) by mouth 3 (three) times daily.    Dispense:  30 capsule    Refill:  0  . HYDROcodone-acetaminophen (NORCO) 5-325 MG per tablet    Sig: Take 1 tablet by mouth every 6 (six) hours as needed for moderate pain.    Dispense:  20 tablet    Refill:  0

## 2014-07-20 ENCOUNTER — Other Ambulatory Visit: Payer: Self-pay | Admitting: Family Medicine

## 2014-07-21 NOTE — Telephone Encounter (Signed)
Refilled prednisone at 10 mg daily but gave 60 for 1 month.   Couldn't reach her phone, both numbers invalid. Sent pharmacy a note to ask her to call and give a valid number.   She is steroid dependent crohns disease, so this will likely be chronic for at least the short term future. She is attempting to get teh orange card.   Murtis Sink, MD [facility] Family Medicine Resident, PGY-3 07/21/2014, 12:29 PM

## 2015-02-21 ENCOUNTER — Ambulatory Visit (INDEPENDENT_AMBULATORY_CARE_PROVIDER_SITE_OTHER): Payer: Self-pay | Admitting: Family Medicine

## 2015-02-21 ENCOUNTER — Encounter: Payer: Self-pay | Admitting: Family Medicine

## 2015-02-21 VITALS — BP 130/77 | HR 75 | Temp 98.5°F | Ht 67.5 in | Wt 260.2 lb

## 2015-02-21 DIAGNOSIS — R21 Rash and other nonspecific skin eruption: Secondary | ICD-10-CM

## 2015-02-21 NOTE — Progress Notes (Signed)
  Subjective:     Gina Gonzalez is a 34 y.o. female who presents for evaluation of a rash involving the chest, flank and upper body. Rash started 3 weeks ago. Lesions are erythematous, and raised in texture. Rash has not changed over time. Rash causes no discomfort. Associated symptoms: none. Patient denies: abdominal pain, arthralgia, congestion, decrease in appetite, decrease in energy level, fever, headache, myalgia and vomiting. Patient has not had contacts with similar rash. Patient has  had new exposures (soaps, lotions, laundry detergents, foods, medications, plants, insects or animals).  States she started using shampoo 4 weeks ago, but stopped about one week ago.  Rash has continued since then despite switching back to previous formula.  Does have Hx of Crohn's disease, tx previously with prednisone, which she has tapered off of over the past several months.    The following portions of the patient's history were reviewed and updated as appropriate: allergies, current medications, past family history, past medical history, past social history, past surgical history and problem list.  Review of Systems Pertinent items are noted in HPI.    Objective:    BP 130/77 mmHg  Pulse 75  Temp(Src) 98.5 F (36.9 C) (Oral)  Ht 5' 7.5" (1.715 m)  Wt 260 lb 3.2 oz (118.026 kg)  BMI 40.13 kg/m2 General:  alert, cooperative and appears stated age  Skin:  plaque noted on trunk, erythematous base about 0.5 cm to 1 cm in area.  Does not bleed with scrapping      Assessment:    Erythematous Rash     Plan:    DDx is quite large at this point.  Doubt contact dermatitis with apperance or no pruritis/burning/bullae.  Could be psoriasis but odd distribution, however, the appearance is c/w this dx.  As well, Pityriasis Rosea is possibility but unsure of inciting herald patch.     Recommend watchful waiting, RTC in 3-4 weeks.  If no improvement at that time or change in Sx, would recommend punch Bx for  formal dx.

## 2015-02-21 NOTE — Patient Instructions (Signed)
Pityriasis Rosea  Pityriasis rosea is a rash which is probably caused by a virus. It generally starts as a scaly, red patch on the trunk (the area of the body that a t-shirt would cover) but does not appear on sun exposed areas. The rash is usually preceded by an initial larger spot called the "herald patch" a week or more before the rest of the rash appears. Generally within one to two days the rash appears rapidly on the trunk, upper arms, and sometimes the upper legs. The rash usually appears as flat, oval patches of scaly pink color. The rash can also be raised and one is able to feel it with a finger. The rash can also be finely crinkled and may slough off leaving a ring of scale around the spot. Sometimes a mild sore throat is present with the rash. It usually affects children and young adults in the spring and autumn. Women are more frequently affected than men.  TREATMENT   Pityriasis rosea is a self-limited condition. This means it goes away within 4 to 8 weeks without treatment. The spots may persist for several months, especially in darker-colored skin after the rash has resolved and healed. Benadryl and steroid creams may be used if itching is a problem.  SEEK MEDICAL CARE IF:   · Your rash does not go away or persists longer than three months.  · You develop fever and joint pain.  · You develop severe headache and confusion.  · You develop breathing difficulty, vomiting and/or extreme weakness.  Document Released: 12/05/2001 Document Revised: 01/21/2012 Document Reviewed: 12/24/2008  Exit[employer]® Patient Information ©2015 [employer]. This information is not intended to replace advice given to you by your health care provider. Make sure you discuss any questions you have with your health care provider.

## 2015-07-23 ENCOUNTER — Other Ambulatory Visit: Payer: Self-pay | Admitting: Family Medicine

## 2015-11-20 IMAGING — CT CT ABD-PELV W/ CM
2 of 4 series · 15 of 46 positions shown, 17 images · IV contrast (APPLIED)
Comparison: Multiple prior studies the most recent of which is
05/30/13

CLINICAL DATA: Patient complains of abdominal pain with nausea and
vomiting for 2 days, with history of Crohn's disease, pain primarily
right lower quadrant

EXAM:
CT ABDOMEN AND PELVIS WITH CONTRAST
TECHNIQUE: Multidetector CT imaging of the abdomen and pelvis was performed
using the standard protocol following bolus administration of
intravenous contrast.
CONTRAST:  100mL OMNIPAQUE IOHEXOL 300 MG/ML  SOLN

[Series 2: abd/ pelvis 5.0 i30f 1 · axial · 0.89mm/px · z∈[+355,+805]mm · 12 of 100 slices shown, 14 images]
[im 5/100  soft-tissue]
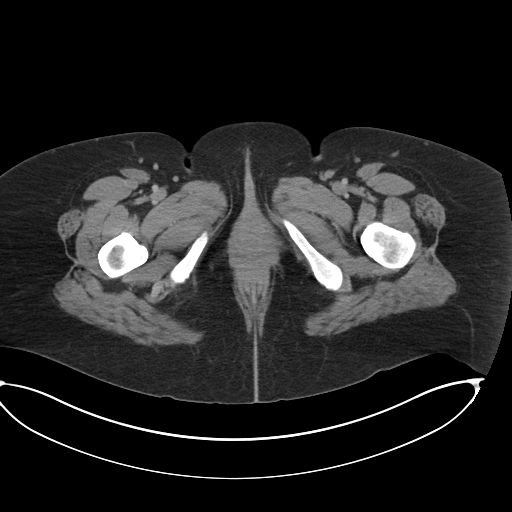
[im 5/100  bone]
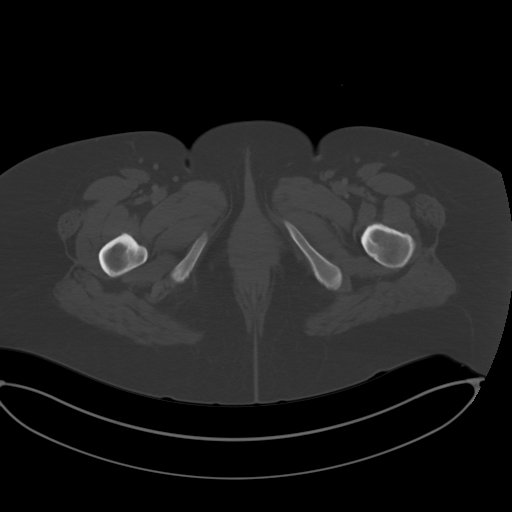
[im 14/100  soft-tissue]
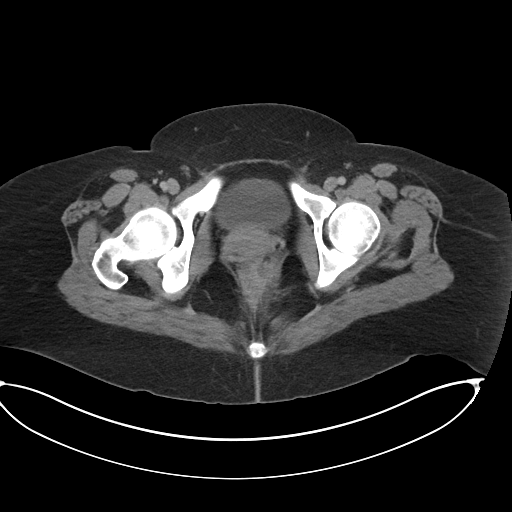
[im 23/100  soft-tissue]
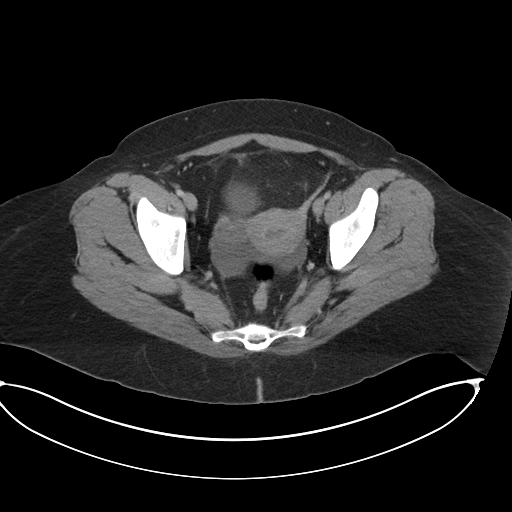
[im 32/100  soft-tissue]
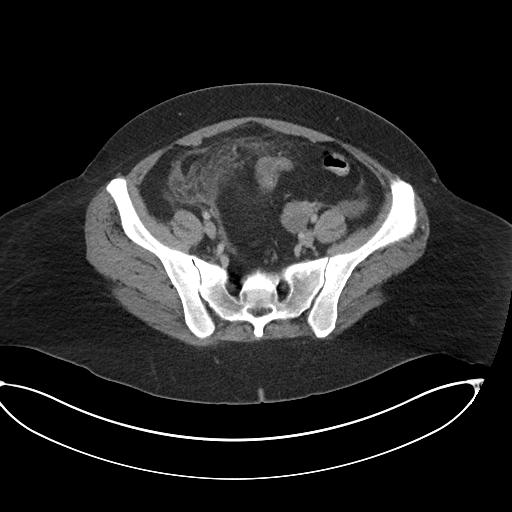
[im 37/100  soft-tissue]
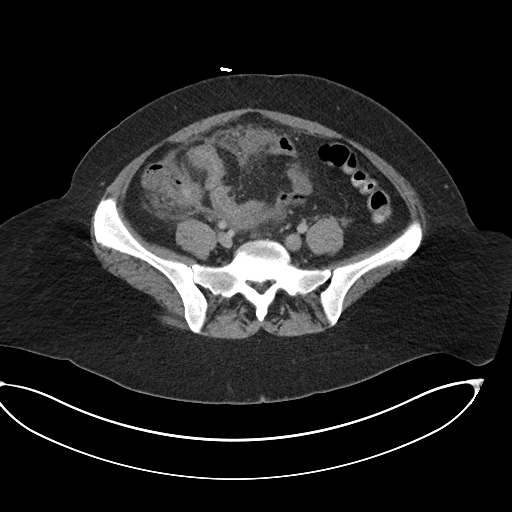
[im 46/100  soft-tissue]
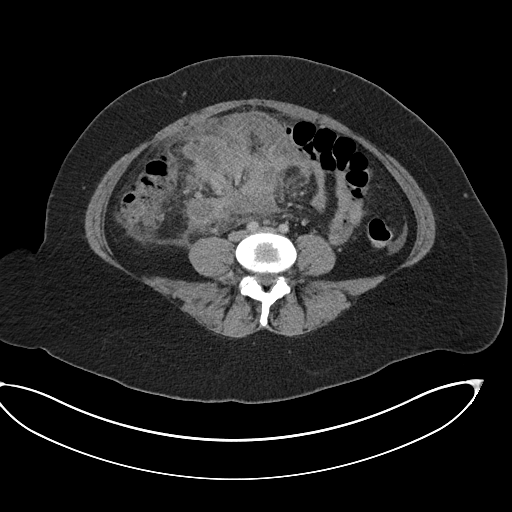
[im 55/100  soft-tissue]
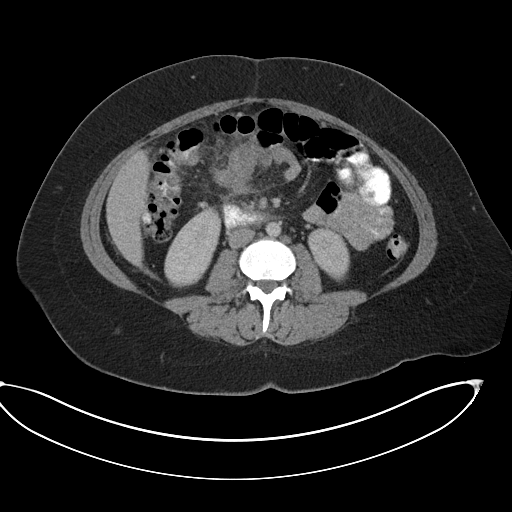
[im 64/100  soft-tissue]
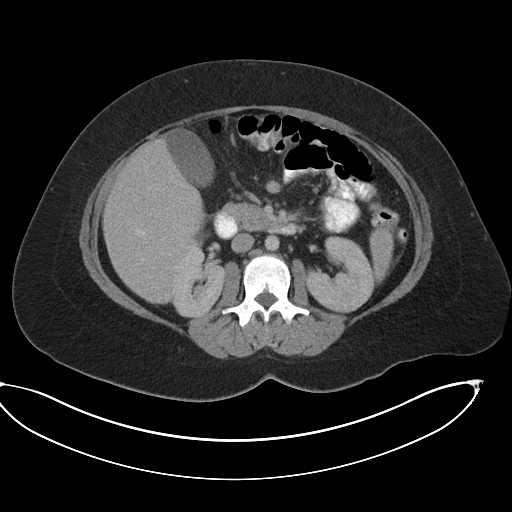
[im 68/100  soft-tissue]
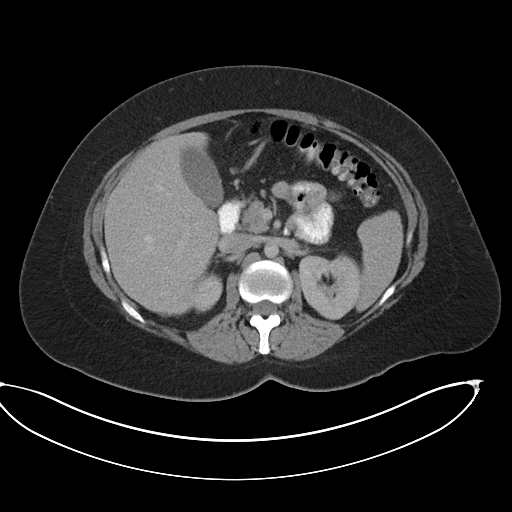
[im 68/100  bone]
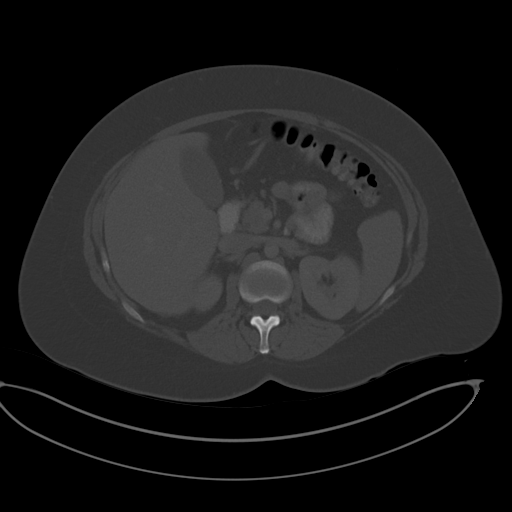
[im 77/100  soft-tissue]
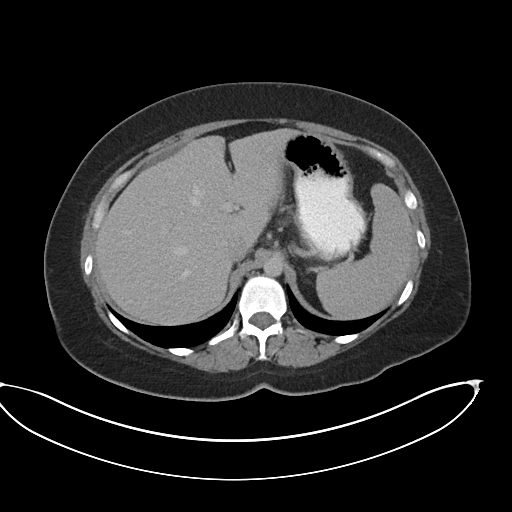
[im 86/100  soft-tissue]
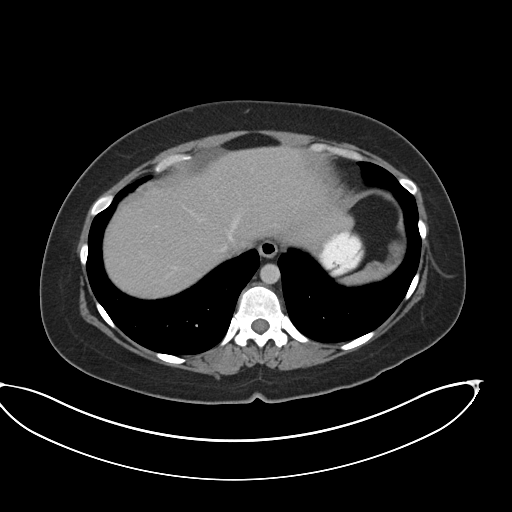
[im 95/100  soft-tissue]
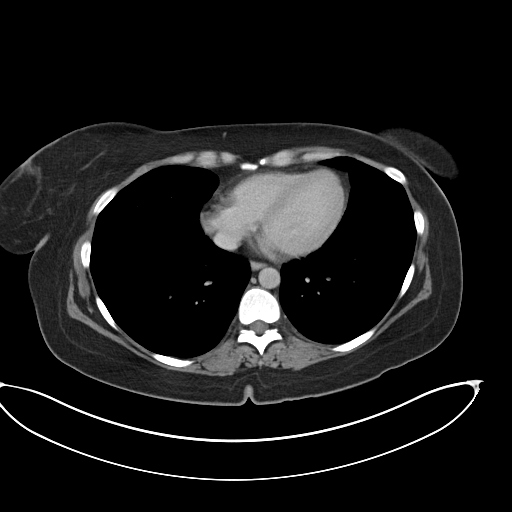

[Series 5: cororal soft tissue · coronal · 0.89mm/px · 3 of 90 slices shown]
[im 30/90  soft-tissue]
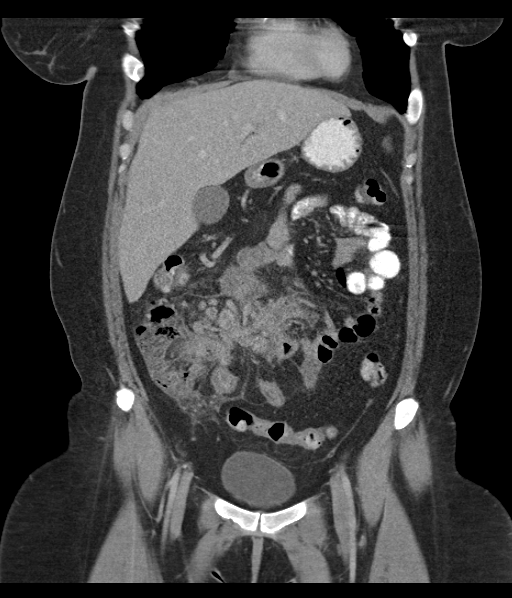
[im 40/90  soft-tissue]
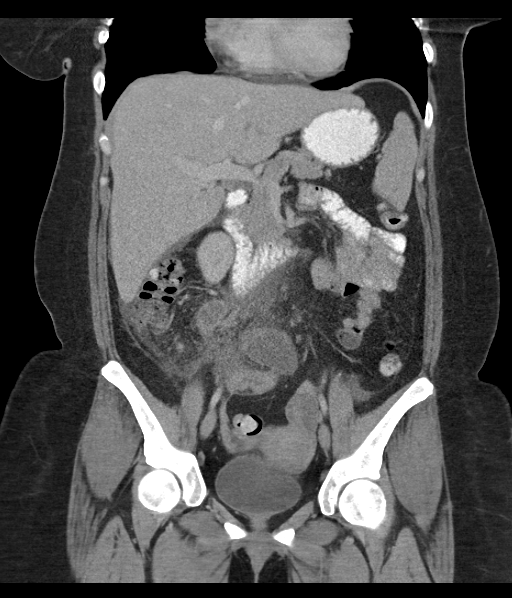
[im 50/90  soft-tissue]
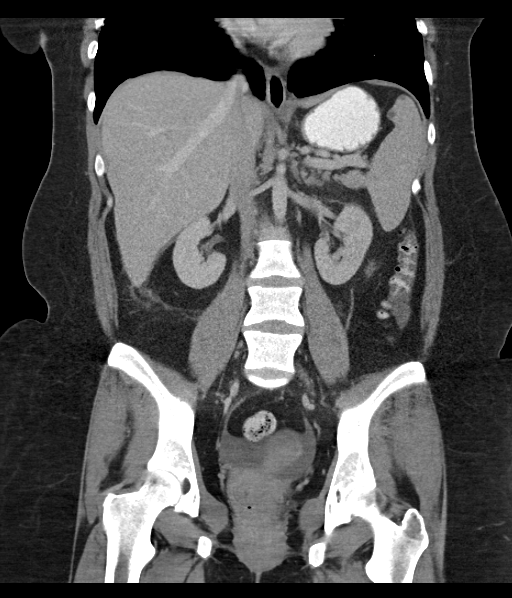

[15 of 46 positions shown; findings below may reference images not displayed]

FINDINGS: The visualized portions of the lung bases are clear.

The liver, gallbladder, and spleen are normal except for a 3 mm
subcapsular low-attenuation lesion in the anterior superior spleen
likely not of clinical consequence. The pancreas, kidneys, and
adrenal glands are normal. Bladder is normal. Uterus appears normal.
Right ovary is normal. Left ovary demonstrates a 20 x 28 mm
low-attenuation oval lesions likely a cyst. There is free fluid in
the cul-de-sac.

The terminal ileum shows severe wall thickening. There is an
intervening section of relatively normal ileum, with more proximal
ileum also showing severe wall thickening she entire a mass of
inflamed small bowel measures approximately 13 x 9 cm in
cross-section on image number 56. There are several fluid
collections associated with this area of abnormal bowel. One such
collection measures 33 x 50 mm and is seen at in the midline over
the lumbar spine. This is seen best on image number 61. Another
collection measures about 1.5 cm and is seen in the anterior right
aspect of the inflamed area. This is seen best on image number 58.
Of 3rd fluid collection measures about 2 cm, and is located on image
number 53 anterior to the right psoas muscle. It appears to contain
a few foci of gas within it. Within this area of inflamed matted
bowel, there are numerous enlarged lymph nodes. There are also tract
of attenuation that would suggest developing fistula, as seen on
image number 61 to the left of the conglomerated inflammatory
process.

Bowel gas pattern is nonobstructive. There are no acute
musculoskeletal findings. There is moderate sclerotic change
involving both sacroiliac joints.
IMPRESSION: 1. Findings again consistent with provided history of Crohn's
disease, with extensive distal small bowel inflammatory change. Most
of the ileum is inflamed with matted loops of bowel and multiple
interloop abscesses. There is adenopathy and there is concern for
developing fistula.
2. Findings consistent with left ovarian ruptured hemorrhagic cyst
3. Sacroiliitis associated with inflammatory bowel disease.

## 2016-05-06 IMAGING — CT CT ABD-PELV W/ CM
2 of 4 series · 16 of 46 positions shown, 18 images · IV contrast (Omni 300)
Comparison: Prior examinations 11/15/2013 and 11/10/2013.

CLINICAL DATA: Abdominal pain with vomiting and diarrhea. History
of Crohn's disease.

EXAM:
CT ABDOMEN AND PELVIS WITH CONTRAST
TECHNIQUE: Multidetector CT imaging of the abdomen and pelvis was performed
using the standard protocol following bolus administration of
intravenous contrast.
CONTRAST:  100mL OMNIPAQUE IOHEXOL 300 MG/ML  SOLN

[Series 2: abd/ pelvis 5.0 i30f 1 · axial · 0.77mm/px · z∈[-452,-2]mm · 13 of 100 slices shown, 15 images]
[im 5/100  soft-tissue]
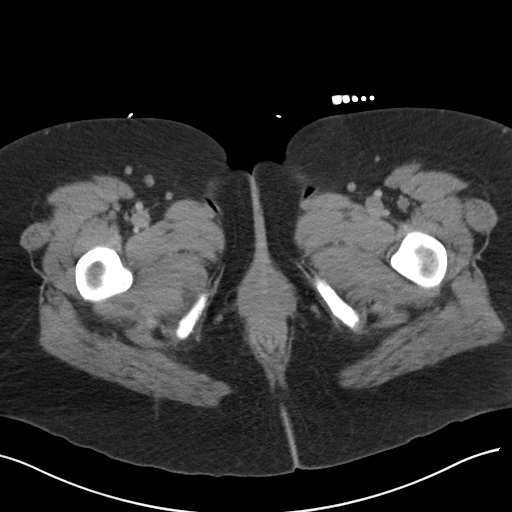
[im 5/100  bone]
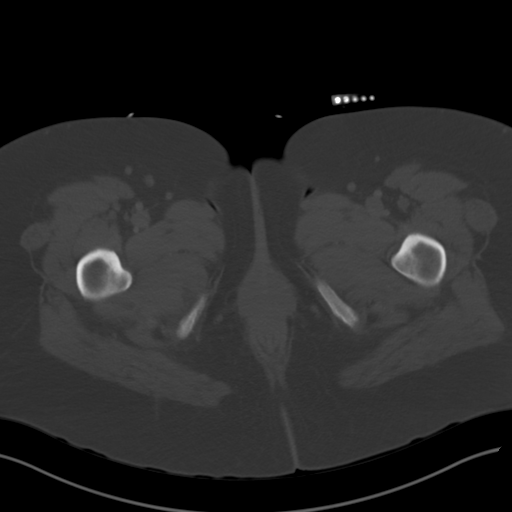
[im 13/100  soft-tissue]
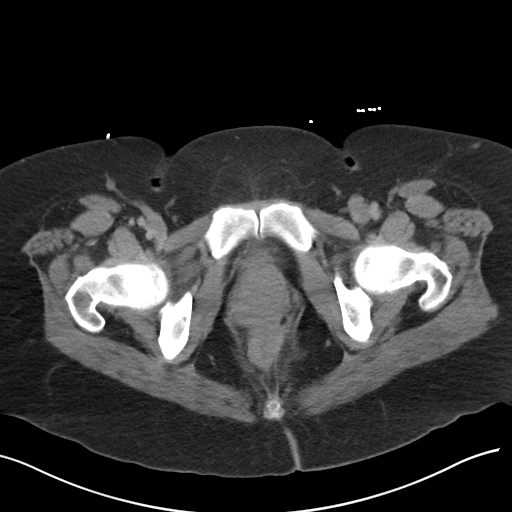
[im 22/100  soft-tissue]
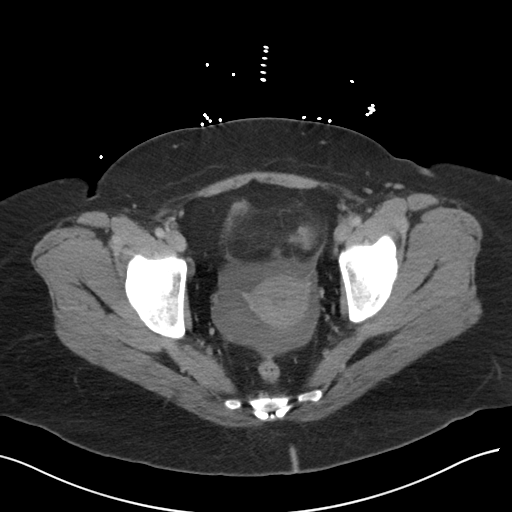
[im 26/100  soft-tissue]
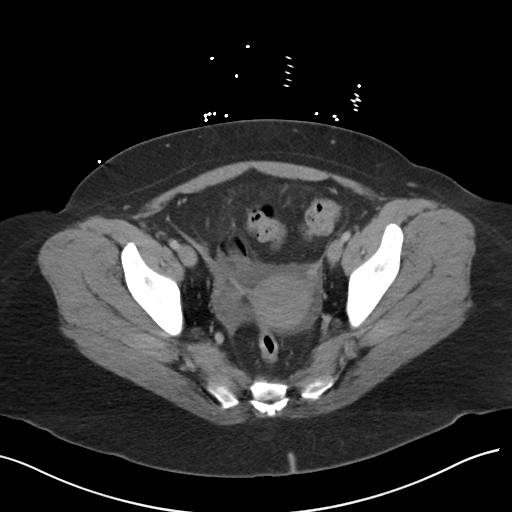
[im 35/100  soft-tissue]
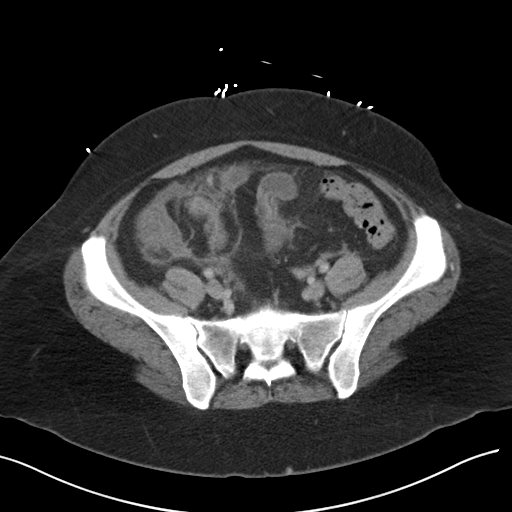
[im 44/100  soft-tissue]
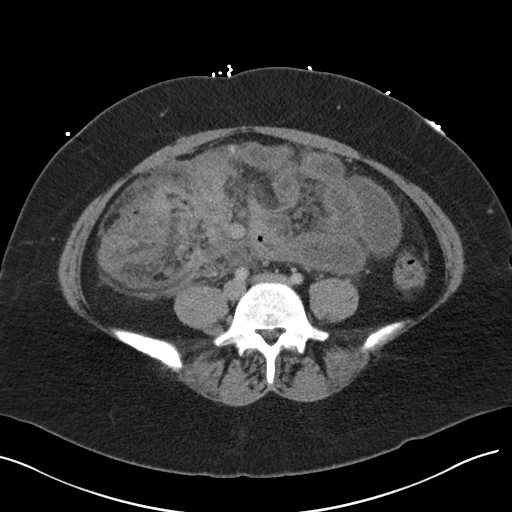
[im 52/100  soft-tissue]
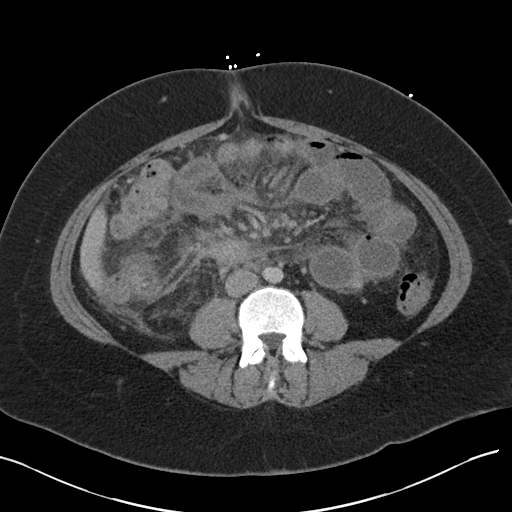
[im 56/100  soft-tissue]
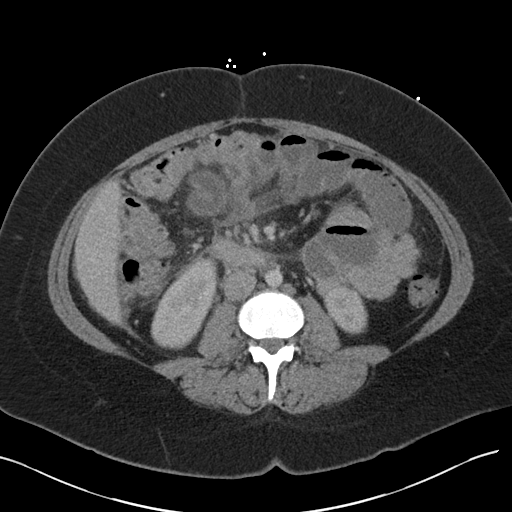
[im 65/100  soft-tissue]
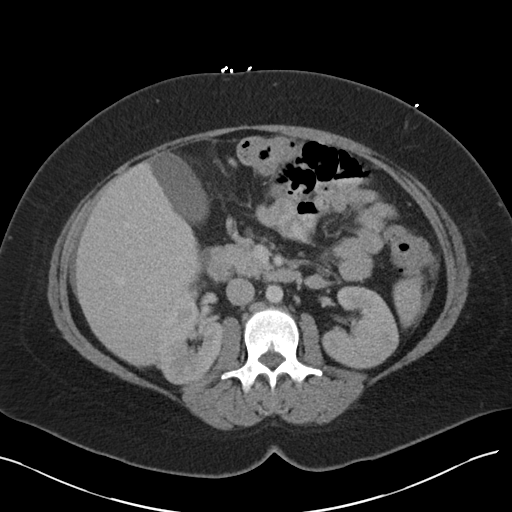
[im 65/100  bone]
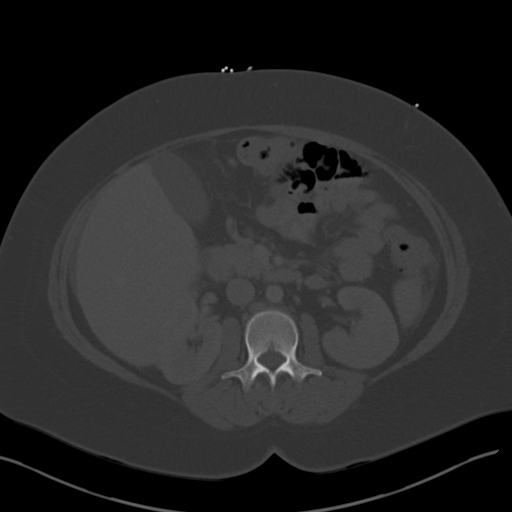
[im 74/100  soft-tissue]
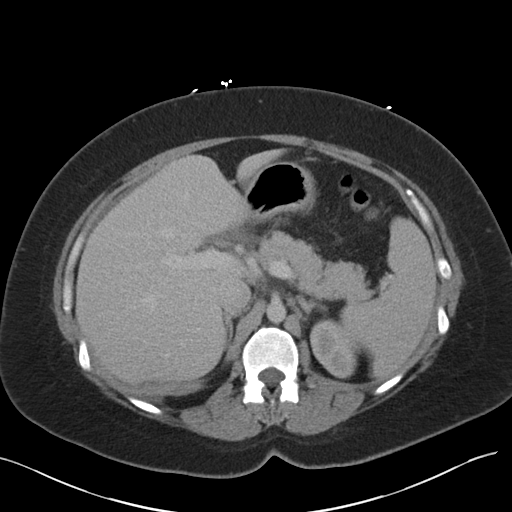
[im 78/100  soft-tissue]
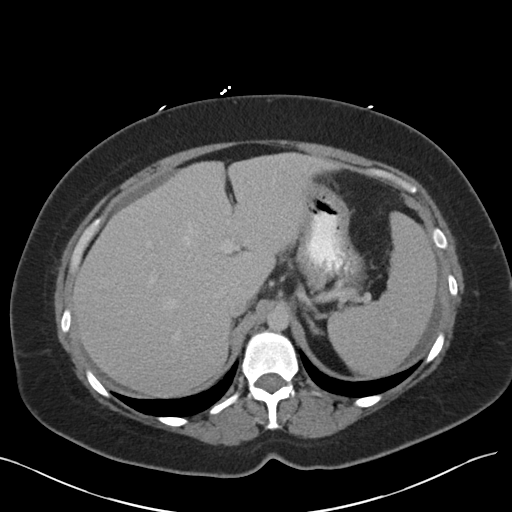
[im 87/100  soft-tissue]
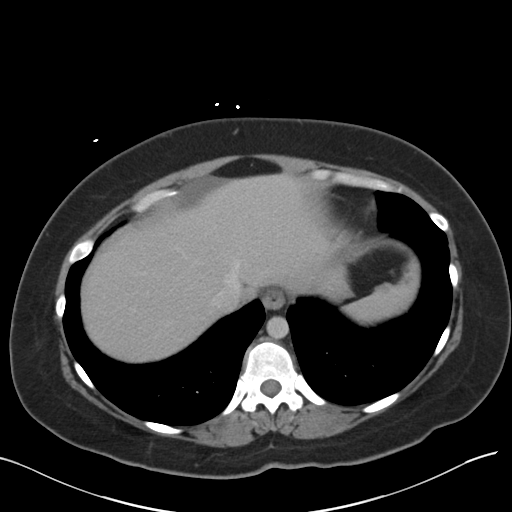
[im 95/100  soft-tissue]
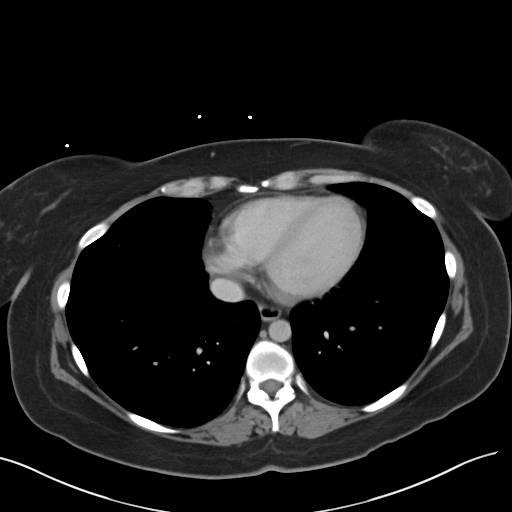

[Series 5: coronals · coronal · 0.84mm/px · 3 of 146 slices shown]
[im 49/146  soft-tissue]
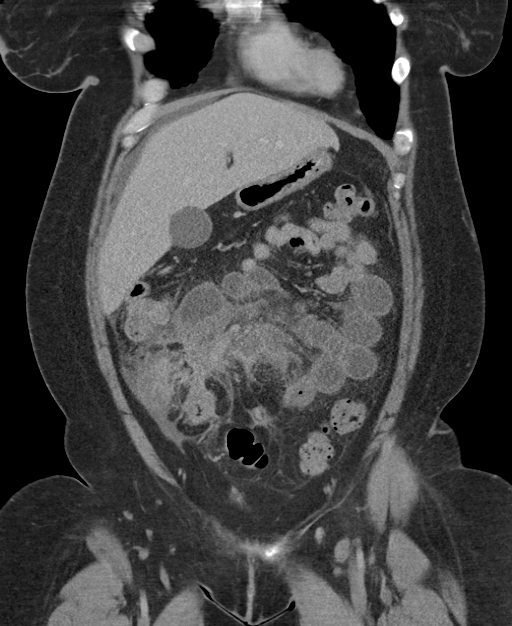
[im 65/146  soft-tissue]
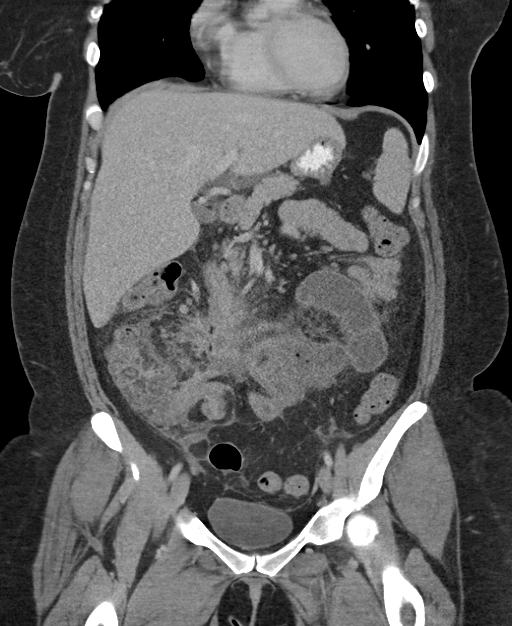
[im 81/146  soft-tissue]
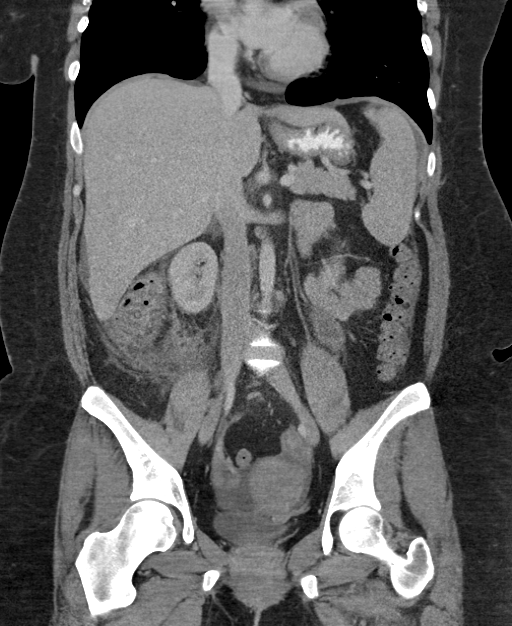

[16 of 46 positions shown; findings below may reference images not displayed]

FINDINGS: The lung bases are clear. There is no significant pleural or
pericardial effusion. A small hiatal hernia is noted.

The liver, gallbladder, biliary system, pancreas, spleen, adrenal
glands and kidneys are stable without significant findings.

Limited enteric contrast was administered, confined to the stomach.
Extensive central mesenteric inflammatory changes have progressed
compared with the prior examination. There is diffuse wall
thickening of the mid to distal small bowel associated with
progressive proximal small bowel distension. Although not definitive
due to the limited enteric contrast, there is suspicion of an
ill-defined central mesenteric extraluminal fluid collection,
measuring approximately 4.6 x 4.0 cm on image 54. No other
extraluminal fluid collections are identified. There are multiple
prominent mesenteric lymph nodes, likely reactive. There is mild
proximal colonic wall thickening. A moderate amount of pelvic
ascites has increased in volume, and there is a small amount of
perihepatic ascites. There is no free intraperitoneal air.

There are no significant vascular findings. The uterus, ovaries and
bladder appear unremarkable.

There are no acute osseous findings. There are probable changes of
bilateral sacroiliitis.
IMPRESSION: 1. Progressive central mesenteric inflammatory changes with diffuse
bowel wall thickening and mild proximal bowel distension. There is
suspicion of an ill-defined mesenteric extraluminal fluid collection
concerning for interloop abscess. No high-grade bowel obstruction or
free intraperitoneal air .
2. Reactive mesenteric lymphadenopathy with increased ascites.
3. Stable appearance of the solid parenchymal organs.

## 2017-01-11 ENCOUNTER — Telehealth: Payer: Self-pay | Admitting: Family Medicine

## 2017-01-11 NOTE — Telephone Encounter (Signed)
No answer, LMOVM. Patient hasn't been seen since 02/2015. Overdue HM . Follow-up to verify that she is still an [facility] patient.

## 2020-07-12 ENCOUNTER — Other Ambulatory Visit: Payer: Self-pay

## 2020-07-12 ENCOUNTER — Encounter: Payer: Self-pay | Admitting: Emergency Medicine

## 2020-07-12 ENCOUNTER — Ambulatory Visit
Admission: EM | Admit: 2020-07-12 | Discharge: 2020-07-12 | Disposition: A | Discharge status: Home / Self Care | Payer: 59 | Attending: Emergency Medicine | Admitting: Emergency Medicine

## 2020-07-12 DIAGNOSIS — K50119 Crohn's disease of large intestine with unspecified complications: Secondary | ICD-10-CM | POA: Diagnosis not present

## 2020-07-12 DIAGNOSIS — R110 Nausea: Secondary | ICD-10-CM

## 2020-07-12 MED ORDER — PREDNISONE 10 MG PO TABS: ORAL_TABLET | ORAL | 0 refills | Status: DC

## 2020-07-12 MED ORDER — ONDANSETRON 4 MG PO TBDP: 4.0000 mg | ORAL_TABLET | Freq: Three times a day (TID) | ORAL | 0 refills | Status: DC | PRN

## 2020-07-12 MED ORDER — PREDNISONE 50 MG PO TABS: 50.0000 mg | ORAL_TABLET | Freq: Every day | ORAL | 0 refills | Status: AC

## 2020-07-12 NOTE — ED Provider Notes (Signed)
[facility]    CSN: [id number] Arrival date & time: 07/12/20  1625      History   Chief Complaint Chief Complaint  Patient presents with   Abdominal Pain    HPI Gina Gonzalez is a 39 y.o. female history of Crohn's disease presenting today for evaluation of abdominal pain.  Patient reports she has had aggressively worsening abdominal pain over the past 4 to 5 days.  Reports feels similar to prior Crohn's flare, last flare in 2017.  Has been relatively well controlled with diet control alone.  She denies any drinking or smoking.  Typically improves with prednisone.  Bowel movements have been softer, occasional diarrhea, but this is normal for her.  Pain increasingly worsened today with associated nausea.  Last menstrual cycle last week.   HPI  Past Medical History:  Diagnosis Date   Anemia 2013   Complication of anesthesia    "took a little while for me to come out of it"   Crohn disease (HCC)    Crohn's disease (HCC) 08/15/2012   Family history of anesthesia complication    "takes Mom a little while to come out" (11/10/2013)   Headache(784.0)    "couple times/month" (11/10/2013)   Intestinal abscess 11/10/2013   Perforated small intestine (HCC) 07/29/2012    Patient Active Problem List   Diagnosis Date Noted   Crohn's colitis (HCC) 04/27/2014   Annual physical exam 02/17/2014   Crohn's disease of ileum (HCC) 10/23/2013   Anemia 08/15/2012    Past Surgical History:  Procedure Laterality Date   COLONOSCOPY N/A 11/17/2013   Procedure: COLONOSCOPY;  Surgeon: Theda Belfast, MD;  Location[facility] ENDOSCOPY;  Service: Endoscopy;  Laterality: N/A;   TONSILECTOMY, ADENOIDECTOMY, BILATERAL MYRINGOTOMY AND TUBES Bilateral 1988   TONSILLECTOMY  1988    OB History   No obstetric history on file.      Home Medications    Prior to Admission medications   Medication Sig Start Date End Date Taking? Authorizing Provider  ibuprofen (ADVIL,MOTRIN) 200  MG tablet Take 800-1,000 mg by mouth 2 (two) times daily as needed (daily). For pain    [provider]  ondansetron (ZOFRAN ODT) 4 MG disintegrating tablet Take 1 tablet (4 mg total) by mouth every 8 (eight) hours as needed for nausea or vomiting. 07/12/20   Laaibah Wartman C, PA-C  predniSONE (DELTASONE) 10 MG tablet After completing course of 50 mg, 40 mg for 2 days, 30 mg for 2 days, 20 mg for 2 days, 10 mg for 2 days 07/12/20   Chrisotpher Rivero C, PA-C  predniSONE (DELTASONE) 50 MG tablet Take 1 tablet (50 mg total) by mouth daily with breakfast for 5 days. 07/12/20 07/17/20  Kierstyn Baranowski, Junius Creamer, PA-C    Family History Family History  Problem Relation Age of Onset   Thyroid disease Mother    Cancer Maternal Grandmother    Pulmonary embolism Father    Deep vein thrombosis Father     Social History Social History   Tobacco Use   Smoking status: Never Smoker   Smokeless tobacco: Never Used  Substance Use Topics   Alcohol use: Yes    Comment: 11/10/2013 "might have a drink a couple times/yr"   Drug use: No     Allergies   Adhesive [tape]   Review of Systems Review of Systems  Constitutional: Negative for activity change, appetite change, chills, fatigue and fever.  HENT: Negative for congestion, ear pain, rhinorrhea, sinus pressure, sore throat and trouble  swallowing.   Eyes: Negative for discharge and redness.  Respiratory: Negative for cough, chest tightness and shortness of breath.   Cardiovascular: Negative for chest pain.  Gastrointestinal: Positive for abdominal pain and nausea. Negative for diarrhea and vomiting.  Musculoskeletal: Negative for myalgias.  Skin: Negative for rash.  Neurological: Negative for dizziness, light-headedness and headaches.     Physical Exam Triage Vital Signs ED Triage Vitals  Enc Vitals Group     BP 07/12/20 1649 124/84     Pulse Rate 07/12/20 1649 96     Resp 07/12/20 1649 18     Temp 07/12/20 1649 98.8 F (37.1 C)      Temp Source 07/12/20 1649 Oral     SpO2 07/12/20 1649 95 %     Weight --      Height --      Head Circumference --      Peak Flow --      Pain Score 07/12/20 1650 4     Pain Loc --      Pain Edu? --      Excl. in GC? --    No data found.  Updated Vital Signs BP 124/84 (BP Location: Right Arm)    Pulse 96    Temp 98.8 F (37.1 C) (Oral)    Resp 18    SpO2 95%   Visual Acuity Right Eye Distance:   Left Eye Distance:   Bilateral Distance:    Right Eye Near:   Left Eye Near:    Bilateral Near:     Physical Exam Vitals and nursing note reviewed.  Constitutional:      Appearance: She is well-developed.     Comments: No acute distress  HENT:     Head: Normocephalic and atraumatic.     Nose: Nose normal.     Mouth/Throat:     Comments: Oral mucosa pink and moist, no tonsillar enlargement or exudate. Posterior pharynx patent and nonerythematous, no uvula deviation or swelling. Normal phonation. Eyes:     Conjunctiva/sclera: Conjunctivae normal.  Cardiovascular:     Rate and Rhythm: Normal rate and regular rhythm.  Pulmonary:     Effort: Pulmonary effort is normal. No respiratory distress.     Comments: Breathing comfortably at rest, CTABL, no wheezing, rales or other adventitious sounds auscultated  Abdominal:     General: There is no distension.     Comments: Soft, nondistended, tenderness to palpation to right middle abdomen, negative rebound, negative Rovsing, negative McBurney's  Musculoskeletal:        General: Normal range of motion.     Cervical back: Neck supple.  Skin:    General: Skin is warm and dry.  Neurological:     Mental Status: She is alert and oriented to person, place, and time.      UC Treatments / Results  Labs (all labs ordered are listed, but only abnormal results are displayed) Labs Reviewed - No data to display  EKG   Radiology No results found.  Procedures Procedures (including critical care time)  Medications Ordered in  UC Medications - No data to display  Initial Impression / Assessment and Plan / UC Course  I have reviewed the triage vital signs and the nursing notes.  Pertinent labs & imaging results that were available during my care of the patient were reviewed by me and considered in my medical decision making (see chart for details).     Treating for Crohn's flare with prednisone course-50 mg x  5 days followed by taper.  Zofran for nausea.  Discussed following up with PCP as well as potentially reestablishing with gastroenterology if developing recurrent flares. Discussed strict return precautions. Patient verbalized understanding and is agreeable with plan.   Final Clinical Impressions(s) / UC Diagnoses   Final diagnoses:  Crohn's disease of colon with complication (HCC)  Nausea without vomiting     Discharge Instructions     Begin prednisone- 50 mg daily for 5 days, 40 mg for 2 days, 30 mg for 2 days, 20 mg for 2 days, 10 mg for 2 days  Zofran for nausea  Follow up if not improving or worsening    ED Prescriptions    Medication Sig Dispense Auth. Provider   ondansetron (ZOFRAN ODT) 4 MG disintegrating tablet Take 1 tablet (4 mg total) by mouth every 8 (eight) hours as needed for nausea or vomiting. 16 tablet Biannca Scantlin C, PA-C   predniSONE (DELTASONE) 50 MG tablet Take 1 tablet (50 mg total) by mouth daily with breakfast for 5 days. 5 tablet Viveca Beckstrom C, PA-C   predniSONE (DELTASONE) 10 MG tablet After completing course of 50 mg, 40 mg for 2 days, 30 mg for 2 days, 20 mg for 2 days, 10 mg for 2 days 20 tablet Willadeen Colantuono C, PA-C     PDMP not reviewed this encounter.   Lew Dawes, [place] 07/12/20 1718

## 2020-07-12 NOTE — ED Triage Notes (Signed)
Pt here for abd pain from crohns flare up; pt sts usually takes steroids with relief; pt sts last flare was 2017

## 2020-07-12 NOTE — Discharge Instructions (Signed)
Begin prednisone- 50 mg daily for 5 days, 40 mg for 2 days, 30 mg for 2 days, 20 mg for 2 days, 10 mg for 2 days  Zofran for nausea  Follow up if not improving or worsening

## 2023-03-20 NOTE — Progress Notes (Signed)
Subjective:    Gina Gonzalez - 42 y.o. female MRN [id number]  Date of birth: [birth year]  HPI  [name] is to establish care.   Current issues and/or concerns: - Requests thyroid panel. Family history of Graves Disease. - Requests CBC.  - Reports taking over-the-counter multivitamin and vitamin B12. Requests vitamin B12 lab.  - Mole of left face causing discomfort due to located near where her glasses are. Would like to have mole removed. Denies additional symptoms. Also, reports numerous moles of generalized body. Family history of melanoma.  - Reports has Crohn's disease. Reports the same is diet controlled without medication. Reports was established with Gastroenterology in the past.  - Frequent nighttime urination. Denies additional symptoms. States has thought if has a "weak bladder". Patient is not ready for referral to specialist as of present. Patient is aware to follow-up with primary provider as scheduled. - Reports snoring. Patient is not ready for sleep study referral. Reports would like to try and lose weight first to see if the same helps. Patient is exercising using elliptical when able to do so.  - No further issues/concerns for discussion today.    ROS per HPI    Health Maintenance:  Health Maintenance Due  Topic Date Due   HIV Screening  Never done   Hepatitis C Screening  Never done     Past Medical History: Patient Active Problem List   Diagnosis Date Noted   Crohn's colitis (HCC) 04/27/2014   Annual physical exam 02/17/2014   Crohn's disease of ileum (HCC) 10/23/2013   Anemia 08/15/2012    Social History   reports that she has never smoked. She has never used smokeless tobacco. She reports current alcohol use. She reports that she does not use drugs.   Family History  family history includes Cancer in her maternal grandmother; Deep vein thrombosis in her father; Pulmonary embolism in her father; Thyroid disease in her mother.   Medications:  reviewed and updated   Objective:   Physical Exam BP 134/82   Pulse 84   Temp 98.7 F (37.1 C) (Oral)   Resp 16   Ht 5\' 7"  (1.702 m)   Wt (!) 323 lb 9.6 oz (146.8 kg)   BMI 50.68 kg/m   Physical Exam HENT:     Head: Normocephalic and atraumatic.  Eyes:     Extraocular Movements: Extraocular movements intact.     Conjunctiva/sclera: Conjunctivae normal.     Pupils: Pupils are equal, round, and reactive to light.  Cardiovascular:     Rate and Rhythm: Normal rate and regular rhythm.     Pulses: Normal pulses.     Heart sounds: Normal heart sounds.  Pulmonary:     Effort: Pulmonary effort is normal.     Breath sounds: Normal breath sounds.  Musculoskeletal:     Cervical back: Normal range of motion and neck supple.  Neurological:     General: No focal deficit present.     Mental Status: She is alert and oriented to person, place, and time.  Psychiatric:        Mood and Affect: Mood normal.        Behavior: Behavior normal.          Assessment & Plan:  1. Encounter to establish care - Patient presents today to establish care. During the interim follow-up with primary provider as scheduled.  - Return for annual physical examination, labs, and health maintenance. Arrive fasting meaning having no food for at least 8  hours prior to appointment. You may have only water or black coffee. Please take scheduled medications as normal.  2. Thyroid disorder screen - Routine screening.  - TSH+T4F+T3Free  3. Screening for deficiency anemia - Routine screening.  - CBC  4. Vitamin deficiency - Routine screening.  - Vitamin B12  5. Numerous moles 6. Family history of melanoma - Referral to Dermatology for further evaluation/management.  - Ambulatory referral to Dermatology     Patient was given clear instructions to go to Emergency Department or return to medical center if symptoms don't improve, worsen, or new problems develop.The patient verbalized understanding.  I  discussed the assessment and treatment plan with the patient. The patient was provided an opportunity to ask questions and all were answered. The patient agreed with the plan and demonstrated an understanding of the instructions.   The patient was advised to call back or seek an in-person evaluation if the symptoms worsen or if the condition fails to improve as anticipated.    Ricky Stabs, NP 03/25/2023, 2:03 PM Primary Care at [facility]

## 2023-03-25 ENCOUNTER — Encounter: Payer: Self-pay | Admitting: Family

## 2023-03-25 ENCOUNTER — Ambulatory Visit (INDEPENDENT_AMBULATORY_CARE_PROVIDER_SITE_OTHER): Payer: No Typology Code available for payment source | Admitting: Family

## 2023-03-25 VITALS — BP 134/82 | HR 84 | Temp 98.7°F | Resp 16 | Ht 67.0 in | Wt 323.6 lb

## 2023-03-25 DIAGNOSIS — Z808 Family history of malignant neoplasm of other organs or systems: Secondary | ICD-10-CM

## 2023-03-25 DIAGNOSIS — Z6841 Body Mass Index (BMI) 40.0 and over, adult: Secondary | ICD-10-CM

## 2023-03-25 DIAGNOSIS — Z1329 Encounter for screening for other suspected endocrine disorder: Secondary | ICD-10-CM

## 2023-03-25 DIAGNOSIS — D229 Melanocytic nevi, unspecified: Secondary | ICD-10-CM | POA: Diagnosis not present

## 2023-03-25 DIAGNOSIS — E669 Obesity, unspecified: Secondary | ICD-10-CM | POA: Diagnosis not present

## 2023-03-25 DIAGNOSIS — Z7689 Persons encountering health services in other specified circumstances: Secondary | ICD-10-CM

## 2023-03-25 DIAGNOSIS — Z130 Encounter for screening for diseases of the blood and blood-forming organs and certain disorders involving the immune mechanism: Secondary | ICD-10-CM | POA: Diagnosis not present

## 2023-03-25 DIAGNOSIS — E569 Vitamin deficiency, unspecified: Secondary | ICD-10-CM

## 2023-03-25 NOTE — Patient Instructions (Signed)
Thank you for choosing Primary Care at [facility] for your medical home!    Gina Gonzalez was seen by Rema Fendt, NP today.   Gina Gonzalez's primary care provider is Ricky Stabs, NP.   For the best care possible,  you should try to see Ricky Stabs, NP whenever you come to office.   We look forward to seeing you again soon!  If you have any questions about your visit today,  please call us at [phone]  Or feel free to reach your provider via MyChart.   Keeping you healthy   Get these tests Blood pressure- Have your blood pressure checked once a year by your healthcare provider.  Normal blood pressure is 120/80. Weight- Have your body mass index (BMI) calculated to screen for obesity.  BMI is a measure of body fat based on height and weight. You can also calculate your own BMI at https://www.west-esparza.com/. Cholesterol- Have your cholesterol checked regularly starting at age 49, sooner may be necessary if you have diabetes, high blood pressure, if a family member developed heart diseases at an early age or if you smoke.  Chlamydia, HIV, and other sexual transmitted disease- Get screened each year until the age of 86 then within three months of each new sexual partner. Diabetes- Have your blood sugar checked regularly if you have high blood pressure, high cholesterol, a family history of diabetes or if you are overweight.   Get these vaccines Flu shot- Every fall. Tetanus shot- Every 10 years. Menactra- Single dose; prevents meningitis.   Take these steps Don't smoke- If you do smoke, ask your healthcare provider about quitting. For tips on how to quit, go to www.smokefree.gov or call 1-800-QUIT-NOW. Be physically active- Exercise 5 days a week for at least 30 minutes.  If you are not already physically active start slow and gradually work up to 30 minutes of moderate physical activity.  Examples of moderate activity include walking briskly, mowing the yard, dancing,  swimming bicycling, etc. Eat a healthy diet- Eat a variety of healthy foods such as fruits, vegetables, low fat milk, low fat cheese, yogurt, lean meats, poultry, fish, beans, tofu, etc.  For more information on healthy eating, go to www.thenutritionsource.org Drink alcohol in moderation- Limit alcohol intake two drinks or less a day.  Never drink and drive. Dentist- Brush and floss teeth twice daily; visit your dentis twice a year. Depression-Your emotional health is as important as your physical health.  If you're feeling down, losing interest in things you normally enjoy please talk with your healthcare provider. Gun Safety- If you keep a gun in your home, keep it unloaded and with the safety lock on.  Bullets should be stored separately. Helmet use- Always wear a helmet when riding a motorcycle, bicycle, rollerblading or skateboarding. Safe sex- If you may be exposed to a sexually transmitted infection, use a condom Seat belts- Seat bels can save your life; always wear one. Smoke/Carbon Monoxide detectors- These detectors need to be installed on the appropriate level of your home.  Replace batteries at least once a year. Skin Cancer- When out in the sun, cover up and use sunscreen SPF 15 or higher. Violence- If anyone is threatening or hurting you, please tell your healthcare provider.

## 2023-03-25 NOTE — Progress Notes (Signed)
Patient is here to established care with provider today. Patient has many health concern they would like to discuss with provider today  Care gaps discuss at appointment today  

## 2023-03-26 LAB — CBC
Hematocrit: 40.4 % (ref 34.0–46.6)
Hemoglobin: 13.9 g/dL (ref 11.1–15.9)
MCH: 31.4 pg (ref 26.6–33.0)
MCHC: 34.4 g/dL (ref 31.5–35.7)
MCV: 91 fL (ref 79–97)
Platelets: 367 10*3/uL (ref 150–450)
RBC: 4.42 x10E6/uL (ref 3.77–5.28)
RDW: 13.1 % (ref 11.7–15.4)
WBC: 8.4 10*3/uL (ref 3.4–10.8)

## 2023-03-26 LAB — TSH+T4F+T3FREE
Free T4: 1.09 ng/dL (ref 0.82–1.77)
T3, Free: 3.2 pg/mL (ref 2.0–4.4)
TSH: 1.36 u[IU]/mL (ref 0.450–4.500)

## 2023-03-26 LAB — VITAMIN B12: Vitamin B-12: >2000 pg/mL — ABNORMAL HIGH (ref 232–1245)

## 2023-04-16 NOTE — Progress Notes (Signed)
Patient ID: Gina Gonzalez, female    DOB: 04/13/1981  MRN: [id number]  CC: Annual Physical Exam  Subjective: Gina Gonzalez is a 42 y.o. female who presents for annual physical exam.   Her concerns today include:  Reports since last visit has not heard from Dermatology referral. Patient provided with contact information to Dermatology. She is aware to notify me if she experiences difficulty scheduling an appointment with them. No further issues/concerns for discussion today.    Patient Active Problem List   Diagnosis Date Noted   Crohn's colitis (HCC) 04/27/2014   Annual physical exam 02/17/2014   Crohn's disease of ileum (HCC) 10/23/2013   Anemia 08/15/2012     Current Outpatient Medications on File Prior to Visit  Medication Sig Dispense Refill   ibuprofen (ADVIL,MOTRIN) 200 MG tablet Take 800-1,000 mg by mouth 2 (two) times daily as needed (daily). For pain (Patient not taking: Reported on 03/25/2023)     predniSONE (DELTASONE) 10 MG tablet After completing course of 50 mg, 40 mg for 2 days, 30 mg for 2 days, 20 mg for 2 days, 10 mg for 2 days (Patient not taking: Reported on 03/25/2023) 20 tablet 0   No current facility-administered medications on file prior to visit.    Allergies  Allergen Reactions   Adhesive [Tape] Other (See Comments)    redness    Social History   Socioeconomic History   Marital status: Single    Spouse name: Not on file   Number of children: Not on file   Years of education: Not on file   Highest education level: Not on file  Occupational History   Not on file  Tobacco Use   Smoking status: Never   Smokeless tobacco: Never  Substance and Sexual Activity   Alcohol use: Yes    Comment: 11/10/2013 "might have a drink a couple times/yr"   Drug use: No   Sexual activity: Not Currently    Birth control/protection: None  Other Topics Concern   Not on file  Social History Narrative   Not on file   Social Determinants of Health    Financial Resource Strain: Low Risk  (03/25/2023)   Overall Financial Resource Strain (CARDIA)    Difficulty of Paying Living Expenses: Not very hard  Food Insecurity: No Food Insecurity (03/25/2023)   Hunger Vital Sign    Worried About Running Out of Food in the Last Year: Never true    Ran Out of Food in the Last Year: Never true  Transportation Needs: No Transportation Needs (03/25/2023)   PRAPARE - Administrator, Civil Service (Medical): No    Lack of Transportation (Non-Medical): No  Physical Activity: Insufficiently Active (03/25/2023)   Exercise Vital Sign    Days of Exercise per Week: 3 days    Minutes of Exercise per Session: 20 min  Stress: No Stress Concern Present (03/25/2023)   [employer] of Occupational Health - Occupational Stress Questionnaire    Feeling of Stress : Not at all  Social Connections: Moderately Isolated (03/25/2023)   Social Connection and Isolation Panel [NHANES]    Frequency of Communication with Friends and Family: More than three times a week    Frequency of Social Gatherings with Friends and Family: Three times a week    Attends Religious Services: More than 4 times per year    Active Member of Clubs or Organizations: No    Attends Banker Meetings: Never    Marital Status: Never  married  Intimate Partner Violence: Not At Risk (03/25/2023)   Humiliation, Afraid, Rape, and Kick questionnaire    Fear of Current or Ex-Partner: No    Emotionally Abused: No    Physically Abused: No    Sexually Abused: No    Family History  Problem Relation Age of Onset   Thyroid disease Mother    Cancer Maternal Grandmother    Pulmonary embolism Father    Deep vein thrombosis Father     Past Surgical History:  Procedure Laterality Date   COLONOSCOPY N/A 11/17/2013   Procedure: COLONOSCOPY;  Surgeon: Theda Belfast, MD;  Location: [facility] ENDOSCOPY;  Service: Endoscopy;  Laterality: N/A;   TONSILECTOMY, ADENOIDECTOMY, BILATERAL  MYRINGOTOMY AND TUBES Bilateral 1988   TONSILLECTOMY  1988    ROS: Review of Systems Negative except as stated above  PHYSICAL EXAM: BP 126/81 (BP Location: Right Arm, Patient Position: Sitting, Cuff Size: Large)   Pulse 85   Resp 16   Ht 5\' 7"  (1.702 m)   Wt (!) 322 lb 6.4 oz (146.2 kg)   LMP 04/03/2023   SpO2 97%   BMI 50.49 kg/m   Physical Exam HENT:     Head: Normocephalic and atraumatic.     Right Ear: Tympanic membrane, ear canal and external ear normal.     Left Ear: Tympanic membrane, ear canal and external ear normal.     Nose: Nose normal.     Mouth/Throat:     Mouth: Mucous membranes are moist.     Pharynx: Oropharynx is clear.  Eyes:     Extraocular Movements: Extraocular movements intact.     Conjunctiva/sclera: Conjunctivae normal.     Pupils: Pupils are equal, round, and reactive to light.  Cardiovascular:     Rate and Rhythm: Normal rate and regular rhythm.     Pulses: Normal pulses.     Heart sounds: Normal heart sounds.  Pulmonary:     Effort: Pulmonary effort is normal.     Breath sounds: Normal breath sounds.  Chest:     Comments: Patient declined.  Abdominal:     General: Bowel sounds are normal.     Palpations: Abdomen is soft.  Genitourinary:    Comments: Patient declined.  Musculoskeletal:        General: Normal range of motion.     Right shoulder: Normal.     Left shoulder: Normal.     Right upper arm: Normal.     Left upper arm: Normal.     Right elbow: Normal.     Left elbow: Normal.     Right forearm: Normal.     Left forearm: Normal.     Right wrist: Normal.     Left wrist: Normal.     Right hand: Normal.     Left hand: Normal.     Cervical back: Normal, normal range of motion and neck supple.     Thoracic back: Normal.     Lumbar back: Normal.     Right hip: Normal.     Left hip: Normal.     Right upper leg: Normal.     Left upper leg: Normal.     Right knee: Normal.     Left knee: Normal.     Right lower leg: Normal.      Left lower leg: Normal.     Right ankle: Normal.     Left ankle: Normal.     Right foot: Normal.     Left foot: Normal.  Skin:    General: Skin is warm and dry.     Capillary Refill: Capillary refill takes less than 2 seconds.  Neurological:     General: No focal deficit present.     Mental Status: She is alert and oriented to person, place, and time.  Psychiatric:        Mood and Affect: Mood normal.        Behavior: Behavior normal.     ASSESSMENT AND PLAN: 1. Annual physical exam - Counseled on 150 minutes of exercise per week as tolerated, healthy eating (including decreased daily intake of saturated fats, cholesterol, added sugars, sodium), STI prevention, and routine healthcare maintenance.  2. Screening for metabolic disorder - Routine screening.  - CMP14+EGFR  3. Diabetes mellitus screening - Routine screening.  - Hemoglobin A1c  4. Screening cholesterol level - Routine screening.  - Lipid panel  5. Encounter for screening for HIV - Routine screening.  - HIV antibody (with reflex)  6. Need for hepatitis C screening test - Routine screening.  - Hepatitis C Antibody  7. Encounter for screening mammogram for malignant neoplasm of breast - Routine screening.  - MM Digital Screening; Future   Patient was given the opportunity to ask questions.  Patient verbalized understanding of the plan and was able to repeat key elements of the plan. Patient was given clear instructions to go to Emergency Department or return to medical center if symptoms don't improve, worsen, or new problems develop.The patient verbalized understanding.   Orders Placed This Encounter  Procedures   MM Digital Screening   HIV antibody (with reflex)   Hepatitis C Antibody   Lipid panel   CMP14+EGFR   Hemoglobin A1c    Return in about 1 year (around 04/18/2024) for Physical per patient preference.  Rema Fendt, NP

## 2023-04-19 ENCOUNTER — Ambulatory Visit (INDEPENDENT_AMBULATORY_CARE_PROVIDER_SITE_OTHER): Payer: No Typology Code available for payment source | Admitting: Family

## 2023-04-19 ENCOUNTER — Encounter: Payer: Self-pay | Admitting: Family

## 2023-04-19 VITALS — BP 126/81 | HR 85 | Resp 16 | Ht 67.0 in | Wt 322.4 lb

## 2023-04-19 DIAGNOSIS — Z6841 Body Mass Index (BMI) 40.0 and over, adult: Secondary | ICD-10-CM

## 2023-04-19 DIAGNOSIS — Z114 Encounter for screening for human immunodeficiency virus [HIV]: Secondary | ICD-10-CM | POA: Diagnosis not present

## 2023-04-19 DIAGNOSIS — Z0000 Encounter for general adult medical examination without abnormal findings: Secondary | ICD-10-CM | POA: Diagnosis not present

## 2023-04-19 DIAGNOSIS — Z131 Encounter for screening for diabetes mellitus: Secondary | ICD-10-CM | POA: Diagnosis not present

## 2023-04-19 DIAGNOSIS — Z13220 Encounter for screening for lipoid disorders: Secondary | ICD-10-CM | POA: Diagnosis not present

## 2023-04-19 DIAGNOSIS — Z1159 Encounter for screening for other viral diseases: Secondary | ICD-10-CM

## 2023-04-19 DIAGNOSIS — Z13228 Encounter for screening for other metabolic disorders: Secondary | ICD-10-CM | POA: Diagnosis not present

## 2023-04-19 DIAGNOSIS — E6609 Other obesity due to excess calories: Secondary | ICD-10-CM | POA: Diagnosis not present

## 2023-04-19 DIAGNOSIS — Z1231 Encounter for screening mammogram for malignant neoplasm of breast: Secondary | ICD-10-CM

## 2023-04-19 NOTE — Progress Notes (Signed)
Pt is here for physical   Requesting referral to derm

## 2023-04-19 NOTE — Patient Instructions (Addendum)
Please call Dermatology. Phone #: [phone]. Address: [address] [place], [place] [address].   Preventive Care 11-41 Years Old, Female Preventive care refers to lifestyle choices and visits with your health care provider that can promote health and wellness. Preventive care visits are also called wellness exams. What can I expect for my preventive care visit? Counseling Your health care provider may ask you questions about your: Medical history, including: Past medical problems. Family medical history. Pregnancy history. Current health, including: Menstrual cycle. Method of birth control. Emotional well-being. Home life and relationship well-being. Sexual activity and sexual health. Lifestyle, including: Alcohol, nicotine or tobacco, and drug use. Access to firearms. Diet, exercise, and sleep habits. Work and work Astronomer. Sunscreen use. Safety issues such as seatbelt and bike helmet use. Physical exam Your health care provider will check your: Height and weight. These may be used to calculate your BMI (body mass index). BMI is a measurement that tells if you are at a healthy weight. Waist circumference. This measures the distance around your waistline. This measurement also tells if you are at a healthy weight and may help predict your risk of certain diseases, such as type 2 diabetes and high blood pressure. Heart rate and blood pressure. Body temperature. Skin for abnormal spots. What immunizations do I need?  Vaccines are usually given at various ages, according to a schedule. Your health care provider will recommend vaccines for you based on your age, medical history, and lifestyle or other factors, such as travel or where you work. What tests do I need? Screening Your health care provider may recommend screening tests for certain conditions. This may include: Lipid and cholesterol levels. Diabetes screening. This is done by checking your blood  sugar (glucose) after you have not eaten for a while (fasting). Pelvic exam and Pap test. Hepatitis B test. Hepatitis C test. HIV (human immunodeficiency virus) test. STI (sexually transmitted infection) testing, if you are at risk. Lung cancer screening. Colorectal cancer screening. Mammogram. Talk with your health care provider about when you should start having regular mammograms. This may depend on whether you have a family history of breast cancer. BRCA-related cancer screening. This may be done if you have a family history of breast, ovarian, tubal, or peritoneal cancers. Bone density scan. This is done to screen for osteoporosis. Talk with your health care provider about your test results, treatment options, and if necessary, the need for more tests. Follow these instructions at home: Eating and drinking  Eat a diet that includes fresh fruits and vegetables, whole grains, lean protein, and low-fat dairy products. Take vitamin and mineral supplements as recommended by your health care provider. Do not drink alcohol if: Your health care provider tells you not to drink. You are pregnant, may be pregnant, or are planning to become pregnant. If you drink alcohol: Limit how much you have to 0-1 drink a day. Know how much alcohol is in your drink. In the [place] one drink equals one 12 oz bottle of beer (355 mL), one 5 oz glass of wine (148 mL), or one 1 oz glass of hard liquor (44 mL). Lifestyle Brush your teeth every morning and night with fluoride toothpaste. Floss one time each day. Exercise for at least 30 minutes 5 or more days each week. Do not use any products that contain nicotine or tobacco. These products include cigarettes, chewing tobacco, and vaping devices, such as e-cigarettes. If you need help quitting, ask your health care provider. Do not use  drugs. If you are sexually active, practice safe sex. Use a condom or other form of protection to prevent STIs. If you do not  wish to become pregnant, use a form of birth control. If you plan to become pregnant, see your health care provider for a prepregnancy visit. Take aspirin only as told by your health care provider. Make sure that you understand how much to take and what form to take. Work with your health care provider to find out whether it is safe and beneficial for you to take aspirin daily. Find healthy ways to manage stress, such as: Meditation, yoga, or listening to music. Journaling. Talking to a trusted person. Spending time with friends and family. Minimize exposure to UV radiation to reduce your risk of skin cancer. Safety Always wear your seat belt while driving or riding in a vehicle. Do not drive: If you have been drinking alcohol. Do not ride with someone who has been drinking. When you are tired or distracted. While texting. If you have been using any mind-altering substances or drugs. Wear a helmet and other protective equipment during sports activities. If you have firearms in your house, make sure you follow all gun safety procedures. Seek help if you have been physically or sexually abused. What's next? Visit your health care provider once a year for an annual wellness visit. Ask your health care provider how often you should have your eyes and teeth checked. Stay up to date on all vaccines. This information is not intended to replace advice given to you by your health care provider. Make sure you discuss any questions you have with your health care provider. Document Revised: 04/26/2021 Document Reviewed: 04/26/2021 Elsevier Patient Education  2024 [employer].

## 2023-04-20 LAB — CMP14+EGFR
ALT: 14 IU/L (ref 0–32)
AST: 20 IU/L (ref 0–40)
Albumin/Globulin Ratio: 1.3 (ref 1.2–2.2)
Albumin: 4.2 g/dL (ref 3.9–4.9)
Alkaline Phosphatase: 79 IU/L (ref 44–121)
BUN/Creatinine Ratio: 21 (ref 9–23)
BUN: 14 mg/dL (ref 6–24)
Bilirubin Total: 0.3 mg/dL (ref 0.0–1.2)
CO2: 20 mmol/L (ref 20–29)
Calcium: 9.6 mg/dL (ref 8.7–10.2)
Chloride: 99 mmol/L (ref 96–106)
Creatinine, Ser: 0.68 mg/dL (ref 0.57–1.00)
Globulin, Total: 3.2 g/dL (ref 1.5–4.5)
Glucose: 67 mg/dL — ABNORMAL LOW (ref 70–99)
Potassium: 4.4 mmol/L (ref 3.5–5.2)
Sodium: 139 mmol/L (ref 134–144)
Total Protein: 7.4 g/dL (ref 6.0–8.5)
eGFR: 113 mL/min/{1.73_m2} (ref >59)

## 2023-04-20 LAB — LIPID PANEL
Chol/HDL Ratio: 4.9 ratio — ABNORMAL HIGH (ref 0.0–4.4)
Cholesterol, Total: 202 mg/dL — ABNORMAL HIGH (ref 100–199)
HDL: 41 mg/dL (ref >39)
LDL Chol Calc (NIH): 126 mg/dL — ABNORMAL HIGH (ref 0–99)
Triglycerides: 197 mg/dL — ABNORMAL HIGH (ref 0–149)
VLDL Cholesterol Cal: 35 mg/dL (ref 5–40)

## 2023-04-20 LAB — HEMOGLOBIN A1C
Est. average glucose Bld gHb Est-mCnc: 117 mg/dL
Hgb A1c MFr Bld: 5.7 % — ABNORMAL HIGH (ref 4.8–5.6)

## 2023-04-20 LAB — HIV ANTIBODY (ROUTINE TESTING W REFLEX): HIV Screen 4th Generation wRfx: NONREACTIVE

## 2023-04-20 LAB — HEPATITIS C ANTIBODY: Hep C Virus Ab: NONREACTIVE

## 2023-04-22 ENCOUNTER — Encounter: Payer: Self-pay | Admitting: Family

## 2023-04-22 ENCOUNTER — Other Ambulatory Visit: Payer: Self-pay | Admitting: Family

## 2023-04-22 DIAGNOSIS — R7303 Prediabetes: Secondary | ICD-10-CM | POA: Insufficient documentation

## 2023-04-22 DIAGNOSIS — E785 Hyperlipidemia, unspecified: Secondary | ICD-10-CM | POA: Insufficient documentation

## 2023-04-22 MED ORDER — ATORVASTATIN CALCIUM 20 MG PO TABS
20.0000 mg | ORAL_TABLET | Freq: Every day | ORAL | 0 refills | Status: DC
Start: 2023-04-22 — End: 2023-05-25

## 2023-05-08 ENCOUNTER — Encounter: Payer: Self-pay | Admitting: Family

## 2023-05-13 ENCOUNTER — Other Ambulatory Visit: Payer: Self-pay | Admitting: Family

## 2023-05-13 DIAGNOSIS — Z1231 Encounter for screening mammogram for malignant neoplasm of breast: Secondary | ICD-10-CM

## 2023-05-24 ENCOUNTER — Ambulatory Visit
Admission: RE | Admit: 2023-05-24 | Discharge: 2023-05-24 | Disposition: A | Discharge status: Home / Self Care | Payer: No Typology Code available for payment source | Source: Ambulatory Visit

## 2023-05-24 ENCOUNTER — Other Ambulatory Visit: Payer: Self-pay

## 2023-05-24 ENCOUNTER — Ambulatory Visit: Payer: No Typology Code available for payment source

## 2023-05-24 DIAGNOSIS — Z1231 Encounter for screening mammogram for malignant neoplasm of breast: Secondary | ICD-10-CM | POA: Diagnosis not present

## 2023-05-24 DIAGNOSIS — E785 Hyperlipidemia, unspecified: Secondary | ICD-10-CM

## 2023-05-25 ENCOUNTER — Other Ambulatory Visit: Payer: Self-pay | Admitting: Family

## 2023-05-25 DIAGNOSIS — E785 Hyperlipidemia, unspecified: Secondary | ICD-10-CM

## 2023-05-25 LAB — LIPID PANEL
Chol/HDL Ratio: 4.1 ratio (ref 0.0–4.4)
Cholesterol, Total: 147 mg/dL (ref 100–199)
HDL: 36 mg/dL — ABNORMAL LOW (ref >39)
LDL Chol Calc (NIH): 83 mg/dL (ref 0–99)
Triglycerides: 164 mg/dL — ABNORMAL HIGH (ref 0–149)
VLDL Cholesterol Cal: 28 mg/dL (ref 5–40)

## 2023-05-25 LAB — SPECIMEN STATUS REPORT

## 2023-05-28 MED ORDER — ATORVASTATIN CALCIUM 20 MG PO TABS
20.0000 mg | ORAL_TABLET | Freq: Every day | ORAL | 0 refills | Status: DC
Start: 2023-05-28 — End: 2023-06-04

## 2023-06-03 ENCOUNTER — Encounter: Payer: Self-pay | Admitting: Family

## 2023-06-04 ENCOUNTER — Other Ambulatory Visit: Payer: Self-pay | Admitting: Family

## 2023-06-04 DIAGNOSIS — E785 Hyperlipidemia, unspecified: Secondary | ICD-10-CM

## 2023-06-04 MED ORDER — ROSUVASTATIN CALCIUM 5 MG PO TABS
5.0000 mg | ORAL_TABLET | Freq: Every day | ORAL | 0 refills | Status: DC
Start: 2023-06-04 — End: 2023-09-02

## 2023-06-04 NOTE — Telephone Encounter (Signed)
Trial Rosuvastatin. Follow-up with primary provider in 4 weeks or sooner if needed.

## 2023-07-18 ENCOUNTER — Other Ambulatory Visit: Payer: Self-pay | Admitting: Dermatology

## 2023-07-18 ENCOUNTER — Ambulatory Visit: Payer: No Typology Code available for payment source | Admitting: Dermatology

## 2023-07-18 VITALS — BP 110/75

## 2023-07-18 DIAGNOSIS — D229 Melanocytic nevi, unspecified: Secondary | ICD-10-CM

## 2023-07-18 DIAGNOSIS — D225 Melanocytic nevi of trunk: Secondary | ICD-10-CM | POA: Diagnosis not present

## 2023-07-18 DIAGNOSIS — L578 Other skin changes due to chronic exposure to nonionizing radiation: Secondary | ICD-10-CM

## 2023-07-18 DIAGNOSIS — D2239 Melanocytic nevi of other parts of face: Secondary | ICD-10-CM | POA: Diagnosis not present

## 2023-07-18 DIAGNOSIS — D485 Neoplasm of uncertain behavior of skin: Secondary | ICD-10-CM

## 2023-07-18 DIAGNOSIS — D1801 Hemangioma of skin and subcutaneous tissue: Secondary | ICD-10-CM

## 2023-07-18 DIAGNOSIS — L821 Other seborrheic keratosis: Secondary | ICD-10-CM

## 2023-07-18 DIAGNOSIS — Z1283 Encounter for screening for malignant neoplasm of skin: Secondary | ICD-10-CM

## 2023-07-18 DIAGNOSIS — L814 Other melanin hyperpigmentation: Secondary | ICD-10-CM

## 2023-07-18 NOTE — Patient Instructions (Addendum)
Dear Patient,  Thank you for visiting my office today. Your proactive approach to your dermatological health is greatly appreciated. It was a pleasure to assist you with your concerns, and we value your commitment to maintaining and improving your health.  Here is a summary of the key instructions and next steps from today's visit:  - Mole Removals: Several moles were removed today from various locations, including:   - Left temple (medial and lateral)   - Mid-back (superior and inferior)  - Post-Procedure Care:   - Cleaning: Keep the removal sites clean by allowing shower water to gently run over them.   - Dressing: Apply Aquaphor and cover with a Band-Aid for about one week.  - Lab Tests: All removed moles have been sent for histological examination to rule out cancer. We will be vigilant for any signs of abnormality.  - Follow-Up:   - Results Communication: Results will be uploaded to your MyChart and communicated via message. Should any moles be found to be abnormal, necessitating further treatment, we will contact you directly.   - Next Appointment: Schedule a follow-up visit in six months for a comprehensive skin check, or sooner if there are any concerns.  - Additional Recommendations:   - Self-Examinations: Continue regular self-examinations and report any new or changing moles.   - Full Examination: Consider scheduling a full head-to-toe skin examination in the winter, given your family history.  Thank you again for addressing your skin health today. Should you have any questions or concerns about your treatment or follow-up care, please do not hesitate to contact our office.  Warm regards,  Dr. Langston Reusing,  Dermatologist      Patient Handout: Wound Care for Skin Biopsy Site  Taking Care of Your Skin Biopsy Site  Proper care of the biopsy site is essential for promoting healing and minimizing scarring. This handout provides instructions on how to care for your  biopsy site to ensure optimal recovery.  1. Cleaning the Wound:  Clean the biopsy site daily with gentle soap and water. Gently pat the area dry with a clean, soft towel. Avoid harsh scrubbing or rubbing the area, as this can irritate the skin and delay healing.  2. Applying Aquaphor and Bandage:  After cleaning the wound, apply a thin layer of Aquaphor ointment to the biopsy site. Cover the area with a sterile bandage to protect it from dirt, bacteria, and friction. Change the bandage daily or as needed if it becomes soiled or wet.  3. Continued Care for One Week:  Repeat the cleaning, Aquaphor application, and bandaging process daily for one week following the biopsy procedure. Keeping the wound clean and moist during this initial healing period will help prevent infection and promote optimal healing.  4. Massaging Aquaphor into the Area:  ---After one week, discontinue the use of bandages but continue to apply Aquaphor to the biopsy site. ----Gently massage the Aquaphor into the area using circular motions. ---Massaging the skin helps to promote circulation and prevent the formation of scar tissue.   Additional Tips:  Avoid exposing the biopsy site to direct sunlight during the healing process, as this can cause hyperpigmentation or worsen scarring. If you experience any signs of infection, such as increased redness, swelling, warmth, or drainage from the wound, contact your healthcare provider immediately. Follow any additional instructions provided by your healthcare provider for caring for the biopsy site and managing any discomfort. Conclusion:  Taking proper care of your skin biopsy site is crucial for  ensuring optimal healing and minimizing scarring. By following these instructions for cleaning, applying Aquaphor, and massaging the area, you can promote a smooth and successful recovery. If you have any questions or concerns about caring for your biopsy site, don't hesitate  to contact your healthcare provider for guidance.

## 2023-07-18 NOTE — Progress Notes (Signed)
New Patient Visit   Subjective  Gina Gonzalez is a 42 y.o. female who presents for the following: She has a mole on her left temple that she is concerned about. It started out flat about 4 years ago and has become raised. Her father passed away with Melanoma in July 24, 2019.    The following portions of the chart were reviewed this encounter and updated as appropriate: medications, allergies, medical history  Review of Systems:  No other skin or systemic complaints except as noted in HPI or Assessment and Plan.  Objective  Well appearing patient in no apparent distress; mood and affect are within normal limits.   A focused examination was performed of the following areas:    Relevant exam findings are noted in the Assessment and Plan.  Left temple medial 1.2 cm Brown irregular papule  Left temple lat 1.0 cm Brown irregular papule  Mid Back sup 8 mm Brown irregular macule       Mid Back inf 1.0 cm Brown irregular papule       Assessment & Plan   INTRADERMAL NEVI Exam: Benign appearing brown papules.  Treatment Plan: Benign appearing. Observe   EPIDERMAL NEVI Exam: Tan-brown and/or pink-flesh-colored symmetric macules and papules  Treatment Plan: Benign appearing on exam today. Recommend observation. Call clinic for new or changing moles. Recommend daily use of broad spectrum spf 30+ sunscreen to sun-exposed areas.   Neoplasm of uncertain behavior of skin (4) Left temple medial  Skin / nail biopsy Type of biopsy: tangential   Informed consent: discussed and consent obtained   Timeout: patient name, date of birth, surgical site, and procedure verified   Procedure prep:  Patient was prepped and draped in usual sterile fashion Prep type:  Isopropyl alcohol Anesthesia: the lesion was anesthetized in a standard fashion   Anesthetic:  1% lidocaine w/ epinephrine 1-100,000 buffered w/ 8.4% NaHCO3 Instrument used: flexible razor blade   Hemostasis achieved with:  pressure, aluminum chloride and electrodesiccation   Outcome: patient tolerated procedure well   Post-procedure details: sterile dressing applied and wound care instructions given   Dressing type: bandage and petrolatum    Left temple lat  Skin / nail biopsy Type of biopsy: tangential   Informed consent: discussed and consent obtained   Timeout: patient name, date of birth, surgical site, and procedure verified   Procedure prep:  Patient was prepped and draped in usual sterile fashion Prep type:  Isopropyl alcohol Anesthesia: the lesion was anesthetized in a standard fashion   Anesthetic:  1% lidocaine w/ epinephrine 1-100,000 buffered w/ 8.4% NaHCO3 Instrument used: flexible razor blade   Hemostasis achieved with: pressure, aluminum chloride and electrodesiccation   Outcome: patient tolerated procedure well   Post-procedure details: sterile dressing applied and wound care instructions given   Dressing type: bandage and petrolatum    Mid Back sup  Skin / nail biopsy Type of biopsy: tangential   Informed consent: discussed and consent obtained   Timeout: patient name, date of birth, surgical site, and procedure verified   Procedure prep:  Patient was prepped and draped in usual sterile fashion Prep type:  Isopropyl alcohol Anesthesia: the lesion was anesthetized in a standard fashion   Anesthetic:  1% lidocaine w/ epinephrine 1-100,000 buffered w/ 8.4% NaHCO3 Instrument used: flexible razor blade   Hemostasis achieved with: pressure, aluminum chloride and electrodesiccation   Outcome: patient tolerated procedure well   Post-procedure details: sterile dressing applied and wound care instructions given   Dressing type: bandage  and petrolatum    Mid Back inf  Skin / nail biopsy Type of biopsy: tangential   Informed consent: discussed and consent obtained   Timeout: patient name, date of birth, surgical site, and procedure verified   Procedure prep:  Patient was prepped and  draped in usual sterile fashion Prep type:  Isopropyl alcohol Anesthesia: the lesion was anesthetized in a standard fashion   Anesthetic:  1% lidocaine w/ epinephrine 1-100,000 buffered w/ 8.4% NaHCO3 Instrument used: flexible razor blade   Hemostasis achieved with: pressure, aluminum chloride and electrodesiccation   Outcome: patient tolerated procedure well   Post-procedure details: sterile dressing applied and wound care instructions given   Dressing type: bandage and petrolatum      Return in about 6 months (around 01/15/2024) for TBSE.  I, Joanie Coddington, CMA, am acting as scribe for [employer], DO .   Documentation: I have reviewed the above documentation for accuracy and completeness, and I agree with the above.  Langston Reusing, DO

## 2023-07-18 NOTE — Progress Notes (Signed)
Follow-Up Visit   Subjective  Gina Gonzalez is a 42 y.o. female who presents for the following: Skin Cancer Screening and Upper Body Skin Exam  The patient presents for Upper Body Skin Exam (UBSE) for skin cancer screening and mole check. The patient has spots, moles and lesions to be evaluated, some may be new or changing and the patient may have concern these could be cancer.    The following portions of the chart were reviewed this encounter and updated as appropriate: medications, allergies, medical history  Review of Systems:  No other skin or systemic complaints except as noted in HPI or Assessment and Plan.  Objective  Well appearing patient in no apparent distress; mood and affect are within normal limits.  All skin waist up examined. Relevant physical exam findings are noted in the Assessment and Plan.  Left temple medial 1.2 cm Brown irregular papule  Left temple lat 1.0 cm Brown irregular papule  Mid Back sup 8 mm Brown irregular macule       Mid Back inf 1.0 cm Brown irregular papule       Assessment & Plan   Neoplasm of uncertain behavior of skin (4) Left temple medial  Skin / nail biopsy Type of biopsy: tangential   Informed consent: discussed and consent obtained   Timeout: patient name, date of birth, surgical site, and procedure verified   Procedure prep:  Patient was prepped and draped in usual sterile fashion Prep type:  Isopropyl alcohol Anesthesia: the lesion was anesthetized in a standard fashion   Anesthetic:  1% lidocaine w/ epinephrine 1-100,000 buffered w/ 8.4% NaHCO3 Instrument used: flexible razor blade   Hemostasis achieved with: pressure, aluminum chloride and electrodesiccation   Outcome: patient tolerated procedure well   Post-procedure details: sterile dressing applied and wound care instructions given   Dressing type: bandage and petrolatum    Left temple lat  Skin / nail biopsy Type of biopsy: tangential   Informed  consent: discussed and consent obtained   Timeout: patient name, date of birth, surgical site, and procedure verified   Procedure prep:  Patient was prepped and draped in usual sterile fashion Prep type:  Isopropyl alcohol Anesthesia: the lesion was anesthetized in a standard fashion   Anesthetic:  1% lidocaine w/ epinephrine 1-100,000 buffered w/ 8.4% NaHCO3 Instrument used: flexible razor blade   Hemostasis achieved with: pressure, aluminum chloride and electrodesiccation   Outcome: patient tolerated procedure well   Post-procedure details: sterile dressing applied and wound care instructions given   Dressing type: bandage and petrolatum    Mid Back sup  Skin / nail biopsy Type of biopsy: tangential   Informed consent: discussed and consent obtained   Timeout: patient name, date of birth, surgical site, and procedure verified   Procedure prep:  Patient was prepped and draped in usual sterile fashion Prep type:  Isopropyl alcohol Anesthesia: the lesion was anesthetized in a standard fashion   Anesthetic:  1% lidocaine w/ epinephrine 1-100,000 buffered w/ 8.4% NaHCO3 Instrument used: flexible razor blade   Hemostasis achieved with: pressure, aluminum chloride and electrodesiccation   Outcome: patient tolerated procedure well   Post-procedure details: sterile dressing applied and wound care instructions given   Dressing type: bandage and petrolatum    Mid Back inf  Skin / nail biopsy Type of biopsy: tangential   Informed consent: discussed and consent obtained   Timeout: patient name, date of birth, surgical site, and procedure verified   Procedure prep:  Patient was prepped  and draped in usual sterile fashion Prep type:  Isopropyl alcohol Anesthesia: the lesion was anesthetized in a standard fashion   Anesthetic:  1% lidocaine w/ epinephrine 1-100,000 buffered w/ 8.4% NaHCO3 Instrument used: flexible razor blade   Hemostasis achieved with: pressure, aluminum chloride and  electrodesiccation   Outcome: patient tolerated procedure well   Post-procedure details: sterile dressing applied and wound care instructions given   Dressing type: bandage and petrolatum     Skin cancer screening performed today.  Actinic Damage - Chronic condition, secondary to cumulative UV/sun exposure - diffuse scaly erythematous macules with underlying dyspigmentation - Recommend daily broad spectrum sunscreen SPF 30+ to sun-exposed areas, reapply every 2 hours as needed.  - Staying in the shade or wearing long sleeves, sun glasses (UVA+UVB protection) and wide brim hats (4-inch brim around the entire circumference of the hat) are also recommended for sun protection.  - Call for new or changing lesions.  Lentigines, Seborrheic Keratoses, Hemangiomas - Benign normal skin lesions - Benign-appearing - Call for any changes  Melanocytic Nevi - Tan-brown and/or pink-flesh-colored symmetric macules and papules - Benign appearing on exam today - Observation - Call clinic for new or changing moles - Recommend daily use of broad spectrum spf 30+ sunscreen to sun-exposed areas.     Return in about 6 months (around 01/15/2024) for TBSE.  ***  Documentation: I have reviewed the above documentation for accuracy and completeness, and I agree with the above.  Langston Reusing, DO

## 2023-07-22 NOTE — Progress Notes (Signed)
Hi Gina Gonzalez  I reviewed your biopsy results and they showed one spot removed was a little "abnormal" but not cancerous all of the others removed were normal.  No additional treatment is required.  We will continue to monitor the area for re-pigmentation during your annual skin exams. The detailed report is available to view in MyChart.  Have a great day!  Kind Regards,  Dr. Kermit Balo Care Team

## 2023-08-31 ENCOUNTER — Other Ambulatory Visit: Payer: Self-pay | Admitting: Family

## 2023-08-31 DIAGNOSIS — E785 Hyperlipidemia, unspecified: Secondary | ICD-10-CM

## 2023-09-02 ENCOUNTER — Other Ambulatory Visit: Payer: Self-pay

## 2023-09-02 DIAGNOSIS — E785 Hyperlipidemia, unspecified: Secondary | ICD-10-CM

## 2023-09-02 MED ORDER — ROSUVASTATIN CALCIUM 5 MG PO TABS: 5.0000 mg | ORAL_TABLET | Freq: Every day | ORAL | 0 refills | Status: DC

## 2023-10-02 ENCOUNTER — Ambulatory Visit
Admission: EM | Admit: 2023-10-02 | Discharge: 2023-10-02 | Disposition: A | Discharge status: Home / Self Care | Payer: No Typology Code available for payment source | Attending: Family Medicine | Admitting: Family Medicine

## 2023-10-02 DIAGNOSIS — N926 Irregular menstruation, unspecified: Secondary | ICD-10-CM

## 2023-10-02 DIAGNOSIS — R112 Nausea with vomiting, unspecified: Secondary | ICD-10-CM | POA: Diagnosis not present

## 2023-10-02 LAB — POCT URINE PREGNANCY: Preg Test, Ur: NEGATIVE

## 2023-10-02 LAB — POCT URINALYSIS DIP (MANUAL ENTRY)
Bilirubin, UA: NEGATIVE
Glucose, UA: NEGATIVE mg/dL
Ketones, POC UA: NEGATIVE mg/dL
Leukocytes, UA: NEGATIVE
Nitrite, UA: NEGATIVE
Protein Ur, POC: 30 mg/dL — AB
Spec Grav, UA: >=1.03 — AB (ref 1.010–1.025)
Urobilinogen, UA: 0.2 U/dL
pH, UA: 6 (ref 5.0–8.0)

## 2023-10-02 MED ORDER — PANTOPRAZOLE SODIUM 40 MG PO TBEC: 40.0000 mg | DELAYED_RELEASE_TABLET | Freq: Every day | ORAL | 0 refills | Status: AC

## 2023-10-02 MED ORDER — ONDANSETRON 4 MG PO TBDP: 4.0000 mg | ORAL_TABLET | Freq: Three times a day (TID) | ORAL | 0 refills | Status: AC | PRN

## 2023-10-02 MED ORDER — PREDNISONE 10 MG (21) PO TBPK: ORAL_TABLET | Freq: Every day | ORAL | 0 refills | Status: AC

## 2023-10-02 NOTE — ED Triage Notes (Addendum)
Patient presents with stomach cramps, nausea, bloating, have vomited some mornings x 2 weeks. Treatment used- Ibuprofen, Dramamine and Midol.

## 2023-10-03 NOTE — ED Provider Notes (Signed)
[facility]   [id number] 10/02/23 Arrival Time: 1644  ASSESSMENT & PLAN:  1. Nausea and vomiting, unspecified vomiting type   2. Late period    Results for orders placed or performed during the hospital encounter of 10/02/23  POCT urinalysis dipstick  Result Value Ref Range   Color, UA yellow yellow   Clarity, UA clear clear   Glucose, UA negative negative mg/dL   Bilirubin, UA negative negative   Ketones, POC UA negative negative mg/dL   Spec Grav, UA >=7.829 (A) 1.010 - 1.025   Blood, UA trace-intact (A) negative   pH, UA 6.0 5.0 - 8.0   Protein Ur, POC =30 (A) negative mg/dL   Urobilinogen, UA 0.2 0.2 or 1.0 E.U./dL   Nitrite, UA Negative Negative   Leukocytes, UA Negative Negative  POCT urine pregnancy  Result Value Ref Range   Preg Test, Ur Negative Negative   Tolerating PO intake without difficulty. Declines blood work today. OTC symptom care as needed.  Discharge Medication List as of 10/02/2023  6:54 PM     START taking these medications   Details  ondansetron (ZOFRAN-ODT) 4 MG disintegrating tablet Take 1 tablet (4 mg total) by mouth every 8 (eight) hours as needed for nausea or vomiting., Starting Wed 10/02/2023, Normal    pantoprazole (PROTONIX) 40 MG tablet Take 1 tablet (40 mg total) by mouth daily., Starting Wed 10/02/2023, Normal    predniSONE (STERAPRED UNI-PAK 21 TAB) 10 MG (21) TBPK tablet Take by mouth daily. Take as directed., Starting Wed 10/02/2023, Normal         Follow-up Information     Rema Fendt, NP.   Specialty: Nurse Practitioner Why: If worsening or failing to improve as anticipated. Contact information: [address] [place] [address] [phone]                 Reviewed expectations re: course of current medical issues. Questions answered. Outlined signs and symptoms indicating need for more acute intervention. Understanding verbalized. After Visit Summary  given.   SUBJECTIVE: History from: Patient. Gina Gonzalez is a 42 y.o. female. Reports: stomach cramping, nausea, bloating; occasional emesis in mornings. Patient's last menstrual period was 08/18/2023. Denies fever. Tolerating PO intake.  OBJECTIVE:  Vitals:   10/02/23 1729 10/02/23 1732 10/02/23 1733  BP:   138/82  Pulse:  87   Resp:  18   Temp:  99.8 F (37.7 C)   TempSrc:  Oral   SpO2:  99%   Weight: (!) 140.6 kg    Height: 5\' 7"  (1.702 m)      General appearance: alert; no distress Eyes: PERRLA; EOMI; conjunctiva normal HENT: [place]; AT; with mild nasal congestion Neck: supple  Lungs: speaks full sentences without difficulty; unlabored Abd: soft/NT Extremities: no edema Skin: warm and dry Neurologic: normal gait Psychological: alert and cooperative; normal mood and affect  Labs: Results for orders placed or performed during the hospital encounter of 10/02/23  POCT urinalysis dipstick  Result Value Ref Range   Color, UA yellow yellow   Clarity, UA clear clear   Glucose, UA negative negative mg/dL   Bilirubin, UA negative negative   Ketones, POC UA negative negative mg/dL   Spec Grav, UA >=2.952 (A) 1.010 - 1.025   Blood, UA trace-intact (A) negative   pH, UA 6.0 5.0 - 8.0   Protein Ur, POC =30 (A) negative mg/dL   Urobilinogen, UA 0.2 0.2 or 1.0 E.U./dL   Nitrite, UA Negative Negative  Leukocytes, UA Negative Negative  POCT urine pregnancy  Result Value Ref Range   Preg Test, Ur Negative Negative   Labs Reviewed  POCT URINALYSIS DIP (MANUAL ENTRY) - Abnormal; Notable for the following components:      Result Value   Spec Grav, UA >=1.030 (*)    Blood, UA trace-intact (*)    Protein Ur, POC =30 (*)    All other components within normal limits  POCT URINE PREGNANCY     Allergies  Allergen Reactions   Adhesive [Tape] Other (See Comments)    redness    Past Medical History:  Diagnosis Date   Anemia 2013   Complication of anesthesia    "took a  little while for me to come out of it"   Crohn disease (HCC)    Crohn's disease (HCC) 08/15/2012   Family history of anesthesia complication    "takes Mom a little while to come out" (11/10/2013)   Headache(784.0)    "couple times/month" (11/10/2013)   Intestinal abscess 11/10/2013   Perforated small intestine (HCC) 07/29/2012   Social History   Socioeconomic History   Marital status: Single    Spouse name: Not on file   Number of children: Not on file   Years of education: Not on file   Highest education level: Not on file  Occupational History   Not on file  Tobacco Use   Smoking status: Never   Smokeless tobacco: Never  Substance and Sexual Activity   Alcohol use: Yes    Comment: 11/10/2013 "might have a drink a couple times/yr"   Drug use: No   Sexual activity: Not Currently    Birth control/protection: None  Other Topics Concern   Not on file  Social History Narrative   Not on file   Social Determinants of Health   Financial Resource Strain: Low Risk  (03/25/2023)   Overall Financial Resource Strain (CARDIA)    Difficulty of Paying Living Expenses: Not very hard  Food Insecurity: No Food Insecurity (03/25/2023)   Hunger Vital Sign    Worried About Running Out of Food in the Last Year: Never true    Ran Out of Food in the Last Year: Never true  Transportation Needs: No Transportation Needs (03/25/2023)   PRAPARE - Administrator, Civil Service (Medical): No    Lack of Transportation (Non-Medical): No  Physical Activity: Insufficiently Active (03/25/2023)   Exercise Vital Sign    Days of Exercise per Week: 3 days    Minutes of Exercise per Session: 20 min  Stress: No Stress Concern Present (03/25/2023)   [employer] of Occupational Health - Occupational Stress Questionnaire    Feeling of Stress : Not at all  Social Connections: Moderately Isolated (03/25/2023)   Social Connection and Isolation Panel [NHANES]    Frequency of Communication with  Friends and Family: More than three times a week    Frequency of Social Gatherings with Friends and Family: Three times a week    Attends Religious Services: More than 4 times per year    Active Member of Clubs or Organizations: No    Attends Banker Meetings: Never    Marital Status: Never married  Intimate Partner Violence: Not At Risk (03/25/2023)   Humiliation, Afraid, Rape, and Kick questionnaire    Fear of Current or Ex-Partner: No    Emotionally Abused: No    Physically Abused: No    Sexually Abused: No   Family History  Problem Relation  Age of Onset   Thyroid disease Mother    Cancer Maternal Grandmother    Pulmonary embolism Father    Deep vein thrombosis Father    Past Surgical History:  Procedure Laterality Date   COLONOSCOPY N/A 11/17/2013   Procedure: COLONOSCOPY;  Surgeon: Theda Belfast, MD;  Location: [facility] ENDOSCOPY;  Service: Endoscopy;  Laterality: N/A;   TONSILECTOMY, ADENOIDECTOMY, BILATERAL MYRINGOTOMY AND TUBES Bilateral 1988   TONSILLECTOMY  1988     Mardella Layman, MD 10/03/23 [phone]

## 2023-12-01 ENCOUNTER — Other Ambulatory Visit: Payer: Self-pay | Admitting: Family

## 2023-12-01 DIAGNOSIS — E785 Hyperlipidemia, unspecified: Secondary | ICD-10-CM

## 2023-12-02 ENCOUNTER — Other Ambulatory Visit: Payer: Self-pay

## 2023-12-02 DIAGNOSIS — E785 Hyperlipidemia, unspecified: Secondary | ICD-10-CM

## 2023-12-02 MED ORDER — ROSUVASTATIN CALCIUM 5 MG PO TABS: 5.0000 mg | ORAL_TABLET | Freq: Every day | ORAL | 0 refills | Status: AC

## 2023-12-17 ENCOUNTER — Ambulatory Visit
Admission: EM | Admit: 2023-12-17 | Discharge: 2023-12-17 | Disposition: A | Discharge status: Home / Self Care | Payer: BC Managed Care – PPO | Attending: Internal Medicine | Admitting: Internal Medicine

## 2023-12-17 DIAGNOSIS — J111 Influenza due to unidentified influenza virus with other respiratory manifestations: Secondary | ICD-10-CM

## 2023-12-17 LAB — POCT RAPID STREP A (OFFICE): Rapid Strep A Screen: NEGATIVE

## 2023-12-17 MED ORDER — CETIRIZINE HCL 10 MG PO TABS: 10.0000 mg | ORAL_TABLET | Freq: Every day | ORAL | 0 refills | Status: AC | PRN

## 2023-12-17 NOTE — ED Triage Notes (Signed)
Pt was in [place] last week and returned Wednesday. Saturday she started vomiting, body aches, chills. Monday she lost her voice. Today, her throat still hurts, she woke up in a cold sweat and she is worried it's strep.

## 2023-12-17 NOTE — Discharge Instructions (Addendum)
You likely have an influenza like illness.  Strep test is negative. Manage symptoms with Mucinex and Alka-Seltzer  Take cetirizine will help with nasal symptoms and postnasal drainage.

## 2023-12-17 NOTE — ED Provider Notes (Signed)
 [facility]    CSN: [id number] Arrival date & time: 12/17/23  1258      History   Chief Complaint Chief Complaint  Patient presents with   Sore Throat    HPI Gina Gonzalez is a 43 y.o. female.   Patient here with a 4-day history of sore throat, body aches, chills subjective fever loss of voice, cold sweats.  Family traveled out of town and is concerned she may have strep.  Reports her voice was completely lost yesterday and at present her voice has not returned to baseline.  Taking guanfacine and Alka-Seltzer plus.  Past Medical History:  Diagnosis Date   Anemia 2013   Complication of anesthesia    took a little while for me to come out of it   Crohn disease (HCC)    Crohn's disease (HCC) 08/15/2012   Family history of anesthesia complication    takes Mom a little while to come out (11/10/2013)   Headache(784.0)    couple times/month (11/10/2013)   Intestinal abscess 11/10/2013   Perforated small intestine (HCC) 07/29/2012    Patient Active Problem List   Diagnosis Date Noted   Prediabetes 04/22/2023   Hyperlipidemia 04/22/2023   Crohn's colitis (HCC) 04/27/2014   Annual physical exam 02/17/2014   Crohn's disease of ileum (HCC) 10/23/2013   Anemia 08/15/2012    Past Surgical History:  Procedure Laterality Date   COLONOSCOPY N/A 11/17/2013   Procedure: COLONOSCOPY;  Surgeon: Belvie JONETTA Just, MD;  Location: [facility] ENDOSCOPY;  Service: Endoscopy;  Laterality: N/A;   TONSILECTOMY, ADENOIDECTOMY, BILATERAL MYRINGOTOMY AND TUBES Bilateral 1988   TONSILLECTOMY  1988    OB History   No obstetric history on file.      Home Medications    Prior to Admission medications   Medication Sig Start Date End Date Taking? Authorizing Provider  cetirizine  (ZYRTEC  ALLERGY) 10 MG tablet Take 1 tablet (10 mg total) by mouth daily as needed for allergies or rhinitis. 12/17/23  Yes Arloa Suzen RAMAN, NP  ibuprofen  (ADVIL ,MOTRIN ) 200 MG tablet Take 800-1,000 mg by  mouth 2 (two) times daily as needed (daily). For pain Patient not taking: Reported on 03/25/2023    [provider]  ondansetron  (ZOFRAN -ODT) 4 MG disintegrating tablet Take 1 tablet (4 mg total) by mouth every 8 (eight) hours as needed for nausea or vomiting. 10/02/23   Rolinda Rogue, MD  pantoprazole  (PROTONIX ) 40 MG tablet Take 1 tablet (40 mg total) by mouth daily. 10/02/23   Rolinda Rogue, MD  predniSONE  (STERAPRED UNI-PAK 21 TAB) 10 MG (21) TBPK tablet Take by mouth daily. Take as directed. 10/02/23   Rolinda Rogue, MD  rosuvastatin  (CRESTOR ) 5 MG tablet TAKE 1 TABLET (5 MG TOTAL) BY MOUTH DAILY. 09/02/23   Lorren Greig PARAS, NP  rosuvastatin  (CRESTOR ) 5 MG tablet TAKE 1 TABLET (5 MG TOTAL) BY MOUTH DAILY. 12/02/23   Lorren Greig PARAS, NP  rosuvastatin  (CRESTOR ) 5 MG tablet Take 1 tablet (5 mg total) by mouth daily. 12/02/23   Lorren Greig PARAS, NP    Family History Family History  Problem Relation Age of Onset   Thyroid  disease Mother    Cancer Maternal Grandmother    Pulmonary embolism Father    Deep vein thrombosis Father     Social History Social History   Tobacco Use   Smoking status: Never   Smokeless tobacco: Never  Substance Use Topics   Alcohol use: Yes    Comment: 11/10/2013 might have a drink a couple  times/yr   Drug use: No     Allergies   Adhesive [tape]   Review of Systems Review of Systems Pertinent negatives listed in HPI  Physical Exam Triage Vital Signs ED Triage Vitals  Encounter Vitals Group     BP 12/17/23 1356 113/62     Systolic BP Percentile --      Diastolic BP Percentile --      Pulse Rate 12/17/23 1356 98     Resp 12/17/23 1356 20     Temp 12/17/23 1356 97.9 F (36.6 C)     Temp Source 12/17/23 1356 Oral     SpO2 12/17/23 1356 97 %     Weight --      Height --      Head Circumference --      Peak Flow --      Pain Score 12/17/23 1352 3     Pain Loc --      Pain Education --      Exclude from Growth Chart --    No data  found.  Updated Vital Signs BP 113/62 (BP Location: Right Arm)   Pulse 98   Temp 97.9 F (36.6 C) (Oral)   Resp 20   LMP 11/01/2023 (Exact Date)   SpO2 97%   Visual Acuity Right Eye Distance:   Left Eye Distance:   Bilateral Distance:    Right Eye Near:   Left Eye Near:    Bilateral Near:     Physical Exam  General Appearance:    Alert, cooperative, no distress  HENT:   Normocephalic, ears normal, nares mucosal edema with congestion, rhinorrhea, oropharynx erythematous without exudate or tonsillar swelling  Eyes:    PERRL, conjunctiva/corneas clear, EOM's intact       Lungs:     Clear to auscultation bilaterally, respirations unlabored  Heart:    Regular rate and rhythm  Neurologic:   Awake, alert, oriented x 3. No apparent focal neurological           defect.      UC Treatments / Results  Labs (all labs ordered are listed, but only abnormal results are displayed) Labs Reviewed  POCT RAPID STREP A (OFFICE) - Normal    EKG   Radiology No results found.  Procedures Procedures (including critical care time)  Medications Ordered in UC Medications - No data to display  Initial Impression / Assessment and Plan / UC Course  I have reviewed the triage vital signs and the nursing notes.  Pertinent labs & imaging results that were available during my care of the patient were reviewed by me and considered in my medical decision making (see chart for details).    Treating for influenza-like illness, patient outside of the timeframe in which Tamiflu would be effective.  Encouraged symptom management, hydration and rest.  Return precautions given if any symptoms worsen. Final Clinical Impressions(s) / UC Diagnoses   Final diagnoses:  Influenza-like illness     Discharge Instructions      You likely have an influenza like illness.  Strep test is negative. Manage symptoms with Mucinex and Alka-Seltzer  Take cetirizine  will help with nasal symptoms and postnasal  drainage.      ED Prescriptions     Medication Sig Dispense Auth. Provider   cetirizine  (ZYRTEC  ALLERGY) 10 MG tablet Take 1 tablet (10 mg total) by mouth daily as needed for allergies or rhinitis. 30 tablet Arloa Suzen RAMAN, NP      PDMP not reviewed  this encounter.   Arloa Suzen RAMAN, NP 12/17/23 [phone]

## 2024-01-16 ENCOUNTER — Ambulatory Visit (INDEPENDENT_AMBULATORY_CARE_PROVIDER_SITE_OTHER): Payer: No Typology Code available for payment source | Admitting: Dermatology

## 2024-01-16 ENCOUNTER — Encounter: Payer: Self-pay | Admitting: Dermatology

## 2024-01-16 DIAGNOSIS — W908XXA Exposure to other nonionizing radiation, initial encounter: Secondary | ICD-10-CM

## 2024-01-16 DIAGNOSIS — D235 Other benign neoplasm of skin of trunk: Secondary | ICD-10-CM

## 2024-01-16 DIAGNOSIS — D492 Neoplasm of unspecified behavior of bone, soft tissue, and skin: Secondary | ICD-10-CM

## 2024-01-16 DIAGNOSIS — D229 Melanocytic nevi, unspecified: Secondary | ICD-10-CM

## 2024-01-16 DIAGNOSIS — L578 Other skin changes due to chronic exposure to nonionizing radiation: Secondary | ICD-10-CM

## 2024-01-16 DIAGNOSIS — Z1283 Encounter for screening for malignant neoplasm of skin: Secondary | ICD-10-CM

## 2024-01-16 DIAGNOSIS — D225 Melanocytic nevi of trunk: Secondary | ICD-10-CM

## 2024-01-16 DIAGNOSIS — D224 Melanocytic nevi of scalp and neck: Secondary | ICD-10-CM | POA: Diagnosis not present

## 2024-01-16 DIAGNOSIS — D485 Neoplasm of uncertain behavior of skin: Secondary | ICD-10-CM

## 2024-01-16 DIAGNOSIS — Z86018 Personal history of other benign neoplasm: Secondary | ICD-10-CM

## 2024-01-16 DIAGNOSIS — L821 Other seborrheic keratosis: Secondary | ICD-10-CM

## 2024-01-16 DIAGNOSIS — L814 Other melanin hyperpigmentation: Secondary | ICD-10-CM

## 2024-01-16 DIAGNOSIS — Z808 Family history of malignant neoplasm of other organs or systems: Secondary | ICD-10-CM

## 2024-01-16 DIAGNOSIS — D239 Other benign neoplasm of skin, unspecified: Secondary | ICD-10-CM

## 2024-01-16 DIAGNOSIS — D1801 Hemangioma of skin and subcutaneous tissue: Secondary | ICD-10-CM

## 2024-01-16 NOTE — Progress Notes (Signed)
 Follow-Up Visit   Subjective  Gina Gonzalez is a 43 y.o. female who presents for the following: Skin Cancer Screening and Full Body Skin Exam - History of Dysplastic nevus. Family history of Melanoma in father.  The patient presents for Total-Body Skin Exam (TBSE) for skin cancer screening and mole check. The patient has spots, moles and lesions to be evaluated, some may be new or changing and the patient may have concern these could be cancer.    The following portions of the chart were reviewed this encounter and updated as appropriate: medications, allergies, medical history  Review of Systems:  No other skin or systemic complaints except as noted in HPI or Assessment and Plan.  Objective  Well appearing patient in no apparent distress; mood and affect are within normal limits.  A full examination was performed including scalp, head, eyes, ears, nose, lips, neck, chest, axillae, abdomen, back, buttocks, bilateral upper extremities, bilateral lower extremities, hands, feet, fingers, toes, fingernails, and toenails. All findings within normal limits unless otherwise noted below.   Relevant physical exam findings are noted in the Assessment and Plan.  Lower back 1.0 cm brown papule with cobblestoning  Occipital scalp 6 mm pink papule  Mid Back Sup Well healed biopsy site with repigmentation   Assessment & Plan   FAMILY HISTORY OF SKIN CANCER What type(s):Melanoma Who affected:Father   SKIN CANCER SCREENING PERFORMED TODAY.  ACTINIC DAMAGE - Chronic condition, secondary to cumulative UV/sun exposure - diffuse scaly erythematous macules with underlying dyspigmentation - Recommend daily broad spectrum sunscreen SPF 30+ to sun-exposed areas, reapply every 2 hours as needed.  - Staying in the shade or wearing long sleeves, sun glasses (UVA+UVB protection) and wide brim hats (4-inch brim around the entire circumference of the hat) are also recommended for sun protection.  -  Call for new or changing lesions.  LENTIGINES, SEBORRHEIC KERATOSES, HEMANGIOMAS - Benign normal skin lesions - Benign-appearing - Call for any changes  MELANOCYTIC NEVI - Tan-brown and/or pink-flesh-colored symmetric macules and papules - Benign appearing on exam today - Observation - Call clinic for new or changing moles - Recommend daily use of broad spectrum spf 30+ sunscreen to sun-exposed areas.   History of Dysplastic Nevus of mid back sup - No evidence of recurrence today - Recommend regular full body skin exams - Recommend daily broad spectrum sunscreen SPF 30+ to sun-exposed areas, reapply every 2 hours as needed.  - Call if any new or changing lesions are noted between office visits     NEOPLASM OF UNCERTAIN BEHAVIOR OF SKIN (2) Lower back Epidermal / dermal shaving  Lesion diameter (cm):  1 Informed consent: discussed and consent obtained   Timeout: patient name, date of birth, surgical site, and procedure verified   Procedure prep:  Patient was prepped and draped in usual sterile fashion Prep type:  Isopropyl alcohol Anesthesia: the lesion was anesthetized in a standard fashion   Anesthetic:  1% lidocaine w/ epinephrine 1-100,000 buffered w/ 8.4% NaHCO3 Instrument used: flexible razor blade   Hemostasis achieved with: pressure, aluminum chloride and electrodesiccation   Outcome: patient tolerated procedure well   Post-procedure details: sterile dressing applied and wound care instructions given   Dressing type: bandage and petrolatum   Specimen 1 - Surgical pathology Differential Diagnosis: Epidermal nevus vs other  Check Margins: No Occipital scalp Epidermal / dermal shaving  Lesion diameter (cm):  0.6 Informed consent: discussed and consent obtained   Timeout: patient name, date of birth, surgical site, and  procedure verified   Procedure prep:  Patient was prepped and draped in usual sterile fashion Prep type:  Isopropyl alcohol Anesthesia: the lesion  was anesthetized in a standard fashion   Anesthetic:  1% lidocaine w/ epinephrine 1-100,000 buffered w/ 8.4% NaHCO3 Instrument used: flexible razor blade   Hemostasis achieved with: pressure, aluminum chloride and electrodesiccation   Outcome: patient tolerated procedure well   Post-procedure details: sterile dressing applied and wound care instructions given   Dressing type: bandage and petrolatum   Specimen 2 - Surgical pathology Differential Diagnosis: Epidermal nevus vs other   Check Margins: No DYSPLASTIC NEVUS Mid Back Sup Due to family history of Melanoma in her father, recommend excision with Dr Caralyn Guile. We will schedule a surgery appointment with Dr Caralyn Guile. ACTINIC SKIN DAMAGE   SKIN EXAM FOR MALIGNANT NEOPLASM   MULTIPLE BENIGN MELANOCYTIC NEVI   CHERRY ANGIOMA   LENTIGINES   Return for Surgery with Dr Caralyn Guile for moderate dysplastic nevus of mid back sup.  I, Joanie Coddington, CMA, am acting as scribe for [employer], DO .   Documentation: I have reviewed the above documentation for accuracy and completeness, and I agree with the above.  Langston Reusing, DO

## 2024-01-16 NOTE — Patient Instructions (Signed)
Patient Handout: Wound Care for Skin Biopsy Site  Taking Care of Your Skin Biopsy Site  Proper care of the biopsy site is essential for promoting healing and minimizing scarring. This handout provides instructions on how to care for your biopsy site to ensure optimal recovery.  1. Cleaning the Wound:  Clean the biopsy site daily with gentle soap and water. Gently pat the area dry with a clean, soft towel. Avoid harsh scrubbing or rubbing the area, as this can irritate the skin and delay healing. 2. Applying Aquaphor and Bandage:  After cleaning the wound, apply a thin layer of Aquaphor ointment to the biopsy site. Cover the area with a sterile bandage to protect it from dirt, bacteria, and friction. Change the bandage daily or as needed if it becomes soiled or wet. 3. Continued Care for One Week:  Repeat the cleaning, Aquaphor application, and bandaging process daily for one week following the biopsy procedure. Keeping the wound clean and moist during this initial healing period will help prevent infection and promote optimal healing. 4. Massaging Aquaphor into the Area:  After one week, discontinue the use of bandages but continue to apply Aquaphor to the biopsy site. Gently massage the Aquaphor into the area using circular motions. Massaging the skin helps to promote circulation and prevent the formation of scar tissue. Additional Tips:  Avoid exposing the biopsy site to direct sunlight during the healing process, as this can cause hyperpigmentation or worsen scarring. If you experience any signs of infection, such as increased redness, swelling, warmth, or drainage from the wound, contact your healthcare provider immediately. Follow any additional instructions provided by your healthcare provider for caring for the biopsy site and managing any discomfort. Conclusion:  Taking proper care of your skin biopsy site is crucial for ensuring optimal healing and minimizing scarring. By  following these instructions for cleaning, applying Aquaphor, and massaging the area, you can promote a smooth and successful recovery. If you have any questions or concerns about caring for your biopsy site, don't hesitate to contact your healthcare provider for guidance.  

## 2024-01-20 LAB — SURGICAL PATHOLOGY

## 2024-01-22 ENCOUNTER — Encounter: Payer: Self-pay | Admitting: Dermatology

## 2024-01-22 NOTE — Progress Notes (Signed)
 Hi Gina Gonzalez  Dr. Onalee Hua reviewed your biopsy results and they showed the spots removed were benign (not cancerous).  No additional treatment is required.  The detailed report is available to view in MyChart.  Have a great day!  Kind Regards,  Dr. Kermit Balo Care Team

## 2024-02-25 ENCOUNTER — Encounter: Payer: Self-pay | Admitting: Dermatology

## 2024-02-27 ENCOUNTER — Ambulatory Visit: Admitting: Dermatology

## 2024-02-27 ENCOUNTER — Encounter: Payer: Self-pay | Admitting: Dermatology

## 2024-02-27 VITALS — BP 118/74 | HR 91

## 2024-02-27 DIAGNOSIS — D235 Other benign neoplasm of skin of trunk: Secondary | ICD-10-CM

## 2024-02-27 DIAGNOSIS — D225 Melanocytic nevi of trunk: Secondary | ICD-10-CM | POA: Diagnosis not present

## 2024-02-27 DIAGNOSIS — D239 Other benign neoplasm of skin, unspecified: Secondary | ICD-10-CM

## 2024-02-27 NOTE — Progress Notes (Signed)
 Follow-Up Visit   Subjective  Gina Gonzalez is a 43 y.o. female who presents for the following: Excision of a Dysplastic Nevus with moderate atypia on the mid back superior, biopsied by Dr. Onalee Hua.  The following portions of the chart were reviewed this encounter and updated as appropriate: medications, allergies, medical history  Review of Systems:  No other skin or systemic complaints except as noted in HPI or Assessment and Plan.  Objective  Well appearing patient in no apparent distress; mood and affect are within normal limits.  A focused examination was performed of the following areas: Mid back superior Relevant physical exam findings are noted in the Assessment and Plan.   Mid Back superior Hypertrophic scar  Assessment & Plan   DYSPLASTIC NEVUS Mid Back superior Skin excision - Mid Back superior  Excision method:  elliptical Lesion length (cm):  1.2 Lesion width (cm):  1.1 Margin per side (cm):  0.4 Total excision diameter (cm):  2 Informed consent: discussed and consent obtained   Timeout: patient name, date of birth, surgical site, and procedure verified   Procedure prep:  Patient was prepped and draped in usual sterile fashion Prep type:  Chlorhexidine Anesthesia: the lesion was anesthetized in a standard fashion   Anesthetic:  1% lidocaine w/ epinephrine 1-100,000 buffered w/ 8.4% NaHCO3 Instrument used: #15 blade   Hemostasis achieved with: suture, pressure and electrodesiccation   Hemostasis achieved with comment:  3.0 PDS with dermabond and steri strips Outcome: patient tolerated procedure well with no complications   Post-procedure details: sterile dressing applied and wound care instructions given   Dressing type: bandage and pressure dressing   Additional details:  Final length 5.0  Skin repair - Mid Back superior Complexity:  Complex Final length (cm):  5 Informed consent: discussed and consent obtained   Timeout: patient name, date of birth,  surgical site, and procedure verified   Procedure prep:  Patient was prepped and draped in usual sterile fashion Prep type:  Chlorhexidine Anesthesia: the lesion was anesthetized in a standard fashion   Anesthetic:  1% lidocaine w/ epinephrine 1-100,000 buffered w/ 8.4% NaHCO3 Reason for type of repair: reduce tension to allow closure, avoid adjacent structures, allow side-to-side closure without requiring a flap or graft and compensate for the inelasticity of skin in this area   Undermining: area extensively undermined   Subcutaneous layers (deep stitches):  Suture size:  3-0 Suture type: PDS (polydioxanone)   Stitches:  Buried vertical mattress Fine/surface layer approximation (top stitches):  Suture type: cyanoacrylate tissue glue   Hemostasis achieved with: suture, pressure and electrodesiccation Outcome: patient tolerated procedure well with no complications   Post-procedure details: sterile dressing applied and wound care instructions given   Dressing type: bandage and pressure dressing   Specimen 1 - Surgical pathology Differential Diagnosis: [id number] Check Margins: No  The surgical wound was then cleaned, prepped, and re-anesthetized as above. Wound edges were undermined extensively along at least one entire edge and at a distance equal to or greater than the width of the defect (see wound defect size above) in order to achieve closure and decrease wound tension and anatomic distortion. Redundant tissue repair including standing cone removal was performed. Hemostasis was achieved with electrocautery. Subcutaneous and epidermal tissues were approximated with the above sutures. The surgical site was then lightly scrubbed with sterile, saline-soaked gauze. Steri-strips were applied, and the area was then bandaged using Vaseline ointment, non-adherent gauze, gauze pads, and tape to provide an adequate pressure dressing. The  patient tolerated the procedure well, was given detailed  written and verbal wound care instructions, and was discharged in good condition.   The patient will follow-up: PRN  Return if symptoms worsen or fail to improve.  I, Wilson Hasten, CMA, am acting as scribe for Deneise Finlay, MD.   Documentation: I have reviewed the above documentation for accuracy and completeness, and I agree with the above.  Deneise Finlay, MD

## 2024-02-27 NOTE — Patient Instructions (Signed)

## 2024-03-05 LAB — SURGICAL PATHOLOGY

## 2024-04-20 ENCOUNTER — Encounter: Payer: Self-pay | Admitting: Family

## 2024-04-20 ENCOUNTER — Ambulatory Visit (INDEPENDENT_AMBULATORY_CARE_PROVIDER_SITE_OTHER): Payer: No Typology Code available for payment source | Admitting: Family

## 2024-04-20 VITALS — BP 118/74 | HR 79 | Temp 98.2°F | Resp 18 | Ht 67.0 in | Wt 320.2 lb

## 2024-04-20 DIAGNOSIS — Z13220 Encounter for screening for lipoid disorders: Secondary | ICD-10-CM

## 2024-04-20 DIAGNOSIS — Z124 Encounter for screening for malignant neoplasm of cervix: Secondary | ICD-10-CM

## 2024-04-20 DIAGNOSIS — Z1231 Encounter for screening mammogram for malignant neoplasm of breast: Secondary | ICD-10-CM | POA: Diagnosis not present

## 2024-04-20 DIAGNOSIS — Z13228 Encounter for screening for other metabolic disorders: Secondary | ICD-10-CM | POA: Diagnosis not present

## 2024-04-20 DIAGNOSIS — Z23 Encounter for immunization: Secondary | ICD-10-CM

## 2024-04-20 DIAGNOSIS — K50919 Crohn's disease, unspecified, with unspecified complications: Secondary | ICD-10-CM

## 2024-04-20 DIAGNOSIS — Z1329 Encounter for screening for other suspected endocrine disorder: Secondary | ICD-10-CM

## 2024-04-20 DIAGNOSIS — Z130 Encounter for screening for diseases of the blood and blood-forming organs and certain disorders involving the immune mechanism: Secondary | ICD-10-CM | POA: Diagnosis not present

## 2024-04-20 DIAGNOSIS — Z131 Encounter for screening for diabetes mellitus: Secondary | ICD-10-CM

## 2024-04-20 DIAGNOSIS — Z0000 Encounter for general adult medical examination without abnormal findings: Secondary | ICD-10-CM | POA: Diagnosis not present

## 2024-04-20 MED ORDER — PREDNISONE 10 MG PO TABS: ORAL_TABLET | ORAL | 0 refills | Status: AC

## 2024-04-20 NOTE — Progress Notes (Signed)
 Abdominal pain since Friday patient ate a plum difficult sleeping because she has to used the bathroom

## 2024-04-20 NOTE — Progress Notes (Signed)
 Patient ID: Gina Gonzalez, female    DOB: 11-20-1980  MRN: [id number]  CC: Annual Exam  Subjective: Gina Gonzalez is a 43 y.o. female who presents for annual exam.   Her concerns today include:  - Crohn's disease. States she recently ate a plum which caused upset stomach. Denies red flag symptoms. States Prednisone  usually helps. States she hasn't been seen by Gastroenterology in a while.  Patient Active Problem List   Diagnosis Date Noted   Prediabetes 04/22/2023   Hyperlipidemia 04/22/2023   Crohn's colitis (HCC) 04/27/2014   Annual physical exam 02/17/2014   Crohn's disease of ileum (HCC) 10/23/2013   Anemia 08/15/2012     Current Outpatient Medications on File Prior to Visit  Medication Sig Dispense Refill   cetirizine  (ZYRTEC  ALLERGY) 10 MG tablet Take 1 tablet (10 mg total) by mouth daily as needed for allergies or rhinitis. 30 tablet 0   ondansetron  (ZOFRAN -ODT) 4 MG disintegrating tablet Take 1 tablet (4 mg total) by mouth every 8 (eight) hours as needed for nausea or vomiting. 15 tablet 0   pantoprazole  (PROTONIX ) 40 MG tablet Take 1 tablet (40 mg total) by mouth daily. 30 tablet 0   predniSONE  (STERAPRED UNI-PAK 21 TAB) 10 MG (21) TBPK tablet Take by mouth daily. Take as directed. 21 tablet 0   rosuvastatin  (CRESTOR ) 5 MG tablet TAKE 1 TABLET (5 MG TOTAL) BY MOUTH DAILY. 90 tablet 0   rosuvastatin  (CRESTOR ) 5 MG tablet TAKE 1 TABLET (5 MG TOTAL) BY MOUTH DAILY. 90 tablet 0   rosuvastatin  (CRESTOR ) 5 MG tablet Take 1 tablet (5 mg total) by mouth daily. 90 tablet 0   ibuprofen  (ADVIL ,MOTRIN ) 200 MG tablet Take 800-1,000 mg by mouth 2 (two) times daily as needed (daily). For pain (Patient not taking: Reported on 03/25/2023)     No current facility-administered medications on file prior to visit.    Allergies  Allergen Reactions   Adhesive [Tape] Other (See Comments)    redness    Social History   Socioeconomic History   Marital status: Single    Spouse name:  Not on file   Number of children: Not on file   Years of education: Not on file   Highest education level: Not on file  Occupational History   Not on file  Tobacco Use   Smoking status: Never   Smokeless tobacco: Never  Substance and Sexual Activity   Alcohol use: Yes    Comment: 11/10/2013 "might have a drink a couple times/yr"   Drug use: No   Sexual activity: Not Currently    Birth control/protection: None  Other Topics Concern   Not on file  Social History Narrative   Not on file   Social Drivers of Health   Financial Resource Strain: Low Risk  (04/20/2024)   Overall Financial Resource Strain (CARDIA)    Difficulty of Paying Living Expenses: Not hard at all  Food Insecurity: No Food Insecurity (04/20/2024)   Hunger Vital Sign    Worried About Running Out of Food in the Last Year: Never true    Ran Out of Food in the Last Year: Never true  Transportation Needs: No Transportation Needs (04/20/2024)   PRAPARE - Administrator, Civil Service (Medical): No    Lack of Transportation (Non-Medical): No  Physical Activity: Insufficiently Active (04/20/2024)   Exercise Vital Sign    Days of Exercise per Week: 1 day    Minutes of Exercise per Session: 30 min  Stress: No Stress Concern Present (04/20/2024)   [employer] of Occupational Health - Occupational Stress Questionnaire    Feeling of Stress : Only a little  Social Connections: Moderately Isolated (04/20/2024)   Social Connection and Isolation Panel [NHANES]    Frequency of Communication with Friends and Family: More than three times a week    Frequency of Social Gatherings with Friends and Family: More than three times a week    Attends Religious Services: Never    Database administrator or Organizations: Yes    Attends Banker Meetings: Not on file    Marital Status: Never married  Intimate Partner Violence: Not At Risk (04/20/2024)   Humiliation, Afraid, Rape, and Kick questionnaire    Fear of  Current or Ex-Partner: No    Emotionally Abused: No    Physically Abused: No    Sexually Abused: No    Family History  Problem Relation Age of Onset   Thyroid  disease Mother    Cancer Maternal Grandmother    Pulmonary embolism Father    Deep vein thrombosis Father     Past Surgical History:  Procedure Laterality Date   COLONOSCOPY N/A 11/17/2013   Procedure: COLONOSCOPY;  Surgeon: Almeda Aris, MD;  Location: [facility] ENDOSCOPY;  Service: Endoscopy;  Laterality: N/A;   TONSILECTOMY, ADENOIDECTOMY, BILATERAL MYRINGOTOMY AND TUBES Bilateral 1988   TONSILLECTOMY  1988    ROS: Review of Systems Negative except as stated above  PHYSICAL EXAM: BP 118/74   Pulse 79   Temp 98.2 F (36.8 C) (Oral)   Resp 18   Ht 5\' 7"  (1.702 m)   Wt (!) 320 lb 3.2 oz (145.2 kg)   LMP 04/14/2024 (Exact Date)   SpO2 95%   BMI 50.15 kg/m   Physical Exam HENT:     Head: Normocephalic and atraumatic.     Right Ear: Tympanic membrane, ear canal and external ear normal.     Left Ear: Tympanic membrane, ear canal and external ear normal.     Nose: Nose normal.     Mouth/Throat:     Mouth: Mucous membranes are moist.     Pharynx: Oropharynx is clear.  Eyes:     Extraocular Movements: Extraocular movements intact.     Conjunctiva/sclera: Conjunctivae normal.     Pupils: Pupils are equal, round, and reactive to light.  Neck:     Thyroid : No thyroid  mass, thyromegaly or thyroid  tenderness.  Cardiovascular:     Rate and Rhythm: Normal rate and regular rhythm.     Pulses: Normal pulses.     Heart sounds: Normal heart sounds.  Pulmonary:     Effort: Pulmonary effort is normal.     Breath sounds: Normal breath sounds.  Chest:     Comments: Patient declined. Abdominal:     General: Bowel sounds are normal.     Palpations: Abdomen is soft.  Genitourinary:    Comments: Patient declined. Musculoskeletal:        General: Normal range of motion.     Right shoulder: Normal.     Left shoulder: Normal.      Right upper arm: Normal.     Left upper arm: Normal.     Right elbow: Normal.     Left elbow: Normal.     Right forearm: Normal.     Left forearm: Normal.     Right wrist: Normal.     Left wrist: Normal.     Right hand: Normal.  Left hand: Normal.     Cervical back: Normal, normal range of motion and neck supple.     Thoracic back: Normal.     Lumbar back: Normal.     Right hip: Normal.     Left hip: Normal.     Right upper leg: Normal.     Left upper leg: Normal.     Right knee: Normal.     Left knee: Normal.     Right lower leg: Normal.     Left lower leg: Normal.     Right ankle: Normal.     Left ankle: Normal.     Right foot: Normal.     Left foot: Normal.  Skin:    General: Skin is warm and dry.     Capillary Refill: Capillary refill takes less than 2 seconds.  Neurological:     General: No focal deficit present.     Mental Status: She is alert and oriented to person, place, and time.  Psychiatric:        Mood and Affect: Mood normal.        Behavior: Behavior normal.     ASSESSMENT AND PLAN: 1. Annual physical exam (Primary) - Counseled on 150 minutes of exercise per week as tolerated, healthy eating (including decreased daily intake of saturated fats, cholesterol, added sugars, sodium), STI prevention, and routine healthcare maintenance.  2. Screening for metabolic disorder - Routine screening.  - CMP14+EGFR  3. Screening for deficiency anemia - Routine screening.  - CBC  4. Diabetes mellitus screening - Routine screening.  - Hemoglobin A1c  5. Screening cholesterol level - Routine screening.  - Lipid panel  6. Thyroid  disorder screen - Routine screening.  - TSH  7. Encounter for screening mammogram for malignant neoplasm of breast - Routine screening.  - MM Digital Screening; Future  8. Cervical cancer screening - Referral to Gynecology for evaluation/management. - Ambulatory referral to Gynecology  9. Crohn's disease with  complication, unspecified gastrointestinal tract location (HCC) - Prednisone  as prescribed. Counseled on medication adherence/adverse effects.  - Referral to Gastroenterology for evaluation/management.  - Follow-up with primary provider as scheduled. - Ambulatory referral to Gastroenterology - predniSONE  (DELTASONE ) 10 MG tablet; Take 6 tablets (60 mg total) by mouth daily with breakfast for 1 day, THEN 5 tablets (50 mg total) daily with breakfast for 1 day, THEN 4 tablets (40 mg total) daily with breakfast for 1 day, THEN 3 tablets (30 mg total) daily with breakfast for 1 day, THEN 2 tablets (20 mg total) daily with breakfast for 1 day, THEN 1 tablet (10 mg total) daily with breakfast for 1 day.  Dispense: 21 tablet; Refill: 0  10. Immunization due - Administered. - Tdap vaccine greater than or equal to 7yo IM   Patient was given the opportunity to ask questions.  Patient verbalized understanding of the plan and was able to repeat key elements of the plan. Patient was given clear instructions to go to Emergency Department or return to medical center if symptoms don't improve, worsen, or new problems develop.The patient verbalized understanding.   Orders Placed This Encounter  Procedures   MM Digital Screening   Tdap vaccine greater than or equal to 7yo IM   CBC   Lipid panel   CMP14+EGFR   Hemoglobin A1c   TSH   Ambulatory referral to Gynecology   Ambulatory referral to Gastroenterology     Requested Prescriptions   Signed Prescriptions Disp Refills   predniSONE  (DELTASONE ) 10 MG tablet  21 tablet 0    Sig: Take 6 tablets (60 mg total) by mouth daily with breakfast for 1 day, THEN 5 tablets (50 mg total) daily with breakfast for 1 day, THEN 4 tablets (40 mg total) daily with breakfast for 1 day, THEN 3 tablets (30 mg total) daily with breakfast for 1 day, THEN 2 tablets (20 mg total) daily with breakfast for 1 day, THEN 1 tablet (10 mg total) daily with breakfast for 1 day.     Return in about 1 year (around 04/20/2025) for Physical per patient preference.  Senaida Dama, NP

## 2024-04-21 ENCOUNTER — Ambulatory Visit: Payer: Self-pay | Admitting: Family

## 2024-04-21 DIAGNOSIS — E785 Hyperlipidemia, unspecified: Secondary | ICD-10-CM

## 2024-04-21 LAB — CMP14+EGFR
ALT: 19 IU/L (ref 0–32)
AST: 18 IU/L (ref 0–40)
Albumin: 4.4 g/dL (ref 3.9–4.9)
Alkaline Phosphatase: 72 IU/L (ref 44–121)
BUN/Creatinine Ratio: 24 — ABNORMAL HIGH (ref 9–23)
BUN: 17 mg/dL (ref 6–24)
Bilirubin Total: 0.2 mg/dL (ref 0.0–1.2)
CO2: 20 mmol/L (ref 20–29)
Calcium: 10 mg/dL (ref 8.7–10.2)
Chloride: 102 mmol/L (ref 96–106)
Creatinine, Ser: 0.71 mg/dL (ref 0.57–1.00)
Globulin, Total: 3 g/dL (ref 1.5–4.5)
Glucose: 93 mg/dL (ref 70–99)
Potassium: 4.4 mmol/L (ref 3.5–5.2)
Sodium: 138 mmol/L (ref 134–144)
Total Protein: 7.4 g/dL (ref 6.0–8.5)
eGFR: 109 mL/min/{1.73_m2} (ref >59)

## 2024-04-21 LAB — CBC
Hematocrit: 42.1 % (ref 34.0–46.6)
Hemoglobin: 13.7 g/dL (ref 11.1–15.9)
MCH: 30.6 pg (ref 26.6–33.0)
MCHC: 32.5 g/dL (ref 31.5–35.7)
MCV: 94 fL (ref 79–97)
Platelets: 374 10*3/uL (ref 150–450)
RBC: 4.47 x10E6/uL (ref 3.77–5.28)
RDW: 13 % (ref 11.7–15.4)
WBC: 9 10*3/uL (ref 3.4–10.8)

## 2024-04-21 LAB — LIPID PANEL
Chol/HDL Ratio: 4.8 ratio — ABNORMAL HIGH (ref 0.0–4.4)
Cholesterol, Total: 174 mg/dL (ref 100–199)
HDL: 36 mg/dL — ABNORMAL LOW (ref >39)
LDL Chol Calc (NIH): 95 mg/dL (ref 0–99)
Triglycerides: 256 mg/dL — ABNORMAL HIGH (ref 0–149)
VLDL Cholesterol Cal: 43 mg/dL — ABNORMAL HIGH (ref 5–40)

## 2024-04-21 LAB — TSH: TSH: 2.04 u[IU]/mL (ref 0.450–4.500)

## 2024-04-21 LAB — HEMOGLOBIN A1C
Est. average glucose Bld gHb Est-mCnc: 111 mg/dL
Hgb A1c MFr Bld: 5.5 % (ref 4.8–5.6)

## 2024-04-21 MED ORDER — ROSUVASTATIN CALCIUM 10 MG PO TABS: 10.0000 mg | ORAL_TABLET | Freq: Every day | ORAL | 0 refills | Status: DC

## 2024-05-08 ENCOUNTER — Encounter: Payer: Self-pay | Admitting: Family

## 2024-05-13 ENCOUNTER — Other Ambulatory Visit: Payer: Self-pay | Admitting: Family

## 2024-05-13 DIAGNOSIS — K50919 Crohn's disease, unspecified, with unspecified complications: Secondary | ICD-10-CM

## 2024-05-13 MED ORDER — PREDNISONE 10 MG PO TABS: ORAL_TABLET | ORAL | 0 refills | Status: AC

## 2024-05-13 NOTE — Telephone Encounter (Signed)
 Prednisone  prescribed. Schedule appointment with Gastroenterology (referred on 04/20/2024). During the interim report the Emergency Department/Urgent Care/call 911 for immediate medical evaluation.

## 2024-07-08 ENCOUNTER — Telehealth: Payer: Self-pay | Admitting: Family Medicine

## 2024-07-08 NOTE — Telephone Encounter (Signed)
 Called patient in regards to referral. Left VM with call back number for patient to call if still in need of appt.

## 2024-07-18 ENCOUNTER — Other Ambulatory Visit: Payer: Self-pay | Admitting: Family

## 2024-07-18 DIAGNOSIS — E785 Hyperlipidemia, unspecified: Secondary | ICD-10-CM

## 2024-07-20 NOTE — Telephone Encounter (Signed)
 Complete

## 2025-03-04 ENCOUNTER — Ambulatory Visit: Admitting: Dermatology

## 2025-04-23 ENCOUNTER — Encounter: Admitting: Family
